# Patient Record
Sex: Female | Born: 1995
Health system: Southern US, Community
[De-identification: ages and names within clinical notes are randomized; demographics above are authoritative.]

## PROBLEM LIST (undated history)

## (undated) DIAGNOSIS — J069 Acute upper respiratory infection, unspecified: Secondary | ICD-10-CM

## (undated) DIAGNOSIS — T783XXA Angioneurotic edema, initial encounter: Secondary | ICD-10-CM

## (undated) DIAGNOSIS — L509 Urticaria, unspecified: Secondary | ICD-10-CM

## (undated) DIAGNOSIS — Z30017 Encounter for initial prescription of implantable subdermal contraceptive: Secondary | ICD-10-CM

## (undated) DIAGNOSIS — L309 Dermatitis, unspecified: Secondary | ICD-10-CM

## (undated) DIAGNOSIS — R636 Underweight: Secondary | ICD-10-CM

## (undated) DIAGNOSIS — Z3046 Encounter for surveillance of implantable subdermal contraceptive: Secondary | ICD-10-CM

## (undated) DIAGNOSIS — J45909 Unspecified asthma, uncomplicated: Secondary | ICD-10-CM

## (undated) DIAGNOSIS — N159 Renal tubulo-interstitial disease, unspecified: Secondary | ICD-10-CM

## (undated) DIAGNOSIS — O039 Complete or unspecified spontaneous abortion without complication: Secondary | ICD-10-CM

## (undated) DIAGNOSIS — Z87442 Personal history of urinary calculi: Secondary | ICD-10-CM

## (undated) HISTORY — PX: WISDOM TOOTH EXTRACTION: SHX21

## (undated) HISTORY — DX: Dermatitis, unspecified: L30.9

## (undated) HISTORY — DX: Urticaria, unspecified: L50.9

## (undated) HISTORY — DX: Unspecified asthma, uncomplicated: J45.909

## (undated) HISTORY — PX: TYMPANOSTOMY TUBE PLACEMENT: SHX32

## (undated) HISTORY — DX: Encounter for initial prescription of implantable subdermal contraceptive: Z30.017

## (undated) HISTORY — DX: Underweight: R63.6

## (undated) HISTORY — DX: Encounter for surveillance of implantable subdermal contraceptive: Z30.46

## (undated) HISTORY — PX: EAR TUBE REMOVAL: SHX1486

## (undated) HISTORY — DX: Acute upper respiratory infection, unspecified: J06.9

## (undated) HISTORY — DX: Complete or unspecified spontaneous abortion without complication: O03.9

## (undated) HISTORY — DX: Angioneurotic edema, initial encounter: T78.3XXA

---
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
## 2015-05-16 ENCOUNTER — Encounter: Payer: Self-pay | Admitting: Physician Assistant

## 2015-05-16 ENCOUNTER — Ambulatory Visit (INDEPENDENT_AMBULATORY_CARE_PROVIDER_SITE_OTHER): Payer: BLUE CROSS/BLUE SHIELD | Admitting: Physician Assistant

## 2015-05-16 VITALS — BP 96/68 | HR 88 | Temp 98.3°F | Resp 18 | Wt 86.0 lb

## 2015-05-16 DIAGNOSIS — J988 Other specified respiratory disorders: Secondary | ICD-10-CM | POA: Diagnosis not present

## 2015-05-16 DIAGNOSIS — B9689 Other specified bacterial agents as the cause of diseases classified elsewhere: Principal | ICD-10-CM

## 2015-05-16 MED ORDER — AZITHROMYCIN 250 MG PO TABS: ORAL_TABLET | ORAL | Status: DC

## 2015-05-16 NOTE — Progress Notes (Signed)
    Patient ID: Kelly Watts MRN: [id number], DOB: March 22, 1996, 20 y.o. Date of Encounter: 05/16/2015, 4:18 PM    Chief Complaint:  Chief Complaint  Patient presents with  . cough x 2 weeks    now chest hurts really bad     HPI: 20 y.o. year old white female presents with above.   Says she has had no congestion in her head/nose. Has been blowing nothing from her nose. No ST. No fever,chills. Has a lot of cough and chest congestion.      Home Meds:   Outpatient Prescriptions Prior to Visit  Medication Sig Dispense Refill  . etonogestrel (NEXPLANON) 68 MG IMPL implant Inject 1 each into the skin once.     No facility-administered medications prior to visit.    Allergies:  Allergies  Allergen Reactions  . Penicillins Hives  . Sulfa Antibiotics       Review of Systems: See HPI for pertinent ROS. All other ROS negative.    Physical Exam: Blood pressure 96/68, pulse 88, temperature 98.3 F (36.8 C), temperature source Oral, resp. rate 18, weight 86 lb (39.009 kg)., Body mass index is 15.24 kg/(m^2). General:  WF. Appears in no acute distress. HEENT: Normocephalic, atraumatic, eyes without discharge, sclera non-icteric, nares are without discharge. Bilateral auditory canals clear, TM's are without perforation, pearly grey and translucent with reflective cone of light bilaterally. Oral cavity moist, posterior pharynx without exudate, erythema, peritonsillar abscess.  Neck: Supple. No thyromegaly. No lymphadenopathy. Lungs: Clear bilaterally to auscultation without wheezes, rales, or rhonchi. Breathing is unlabored. Heart: Regular rhythm. No murmurs, rubs, or gallops. Msk:  Strength and tone normal for age. Extremities/Skin: Warm and dry. Neuro: Alert and oriented X 3. Moves all extremities spontaneously. Gait is normal. CNII-XII grossly in tact. Psych:  Responds to questions appropriately with a normal affect.     ASSESSMENT AND PLAN:  20 y.o. year old female with    1. Bacterial respiratory infection Allergy to PCN and Sulfa.  Start abx immediately, take as directed, complete all of it. Cont expectorant. Use cough suppressant at night so can get sleep. F/U if symptoms do not resolve within one week after completion of antibiotic.  - azithromycin (ZITHROMAX) 250 MG tablet; Day 1: Take 2 daily.  Days 2-5: Take 1 daily.  Dispense: 6 tablet; Refill: 0   Signed, 7090 Broad RoadMary Beth ElizabethDixon, [place]PA, [facility] 05/16/2015 4:18 PM

## 2015-06-27 ENCOUNTER — Encounter: Payer: Self-pay | Admitting: Family Medicine

## 2015-06-27 ENCOUNTER — Ambulatory Visit (INDEPENDENT_AMBULATORY_CARE_PROVIDER_SITE_OTHER): Payer: BLUE CROSS/BLUE SHIELD | Admitting: Family Medicine

## 2015-06-27 VITALS — BP 98/60 | HR 100 | Temp 102.2°F | Resp 20 | Ht 63.0 in | Wt 89.0 lb

## 2015-06-27 DIAGNOSIS — J111 Influenza due to unidentified influenza virus with other respiratory manifestations: Secondary | ICD-10-CM

## 2015-06-27 LAB — INFLUENZA A AND B AG, IMMUNOASSAY
INFLUENZA B ANTIGEN: NOT DETECTED
Influenza A Antigen: NOT DETECTED

## 2015-06-27 MED ORDER — OSELTAMIVIR PHOSPHATE 75 MG PO CAPS: 75.0000 mg | ORAL_CAPSULE | Freq: Two times a day (BID) | ORAL | Status: DC

## 2015-06-27 MED ORDER — ACETAMINOPHEN 500 MG PO TABS
1000.0000 mg | ORAL_TABLET | Freq: Once | ORAL | Status: AC
  Administered 2015-06-27: 1000 mg via ORAL

## 2015-06-27 NOTE — Progress Notes (Signed)
Patient ID: Kelly Watts, female   DOB: [birth year], 20 y.o.   MRN: [id number]   Subjective:    Patient ID: Kelly Watts, female    DOB: [birth year], 20 y.o.   MRN: [id number]  Patient presents for Illness patient here with fever last night 100.25F body aches and mild cough mild sore throat which all started last night. Positive sick contact with her cousin who has been diagnosed with the flu yesterday. She's not had any nausea vomiting or diarrhea. She took ibuprofen the last dose was 3 hours ago her temperature currently is 10 62F    Review Of Systems:  GEN- +fatigue,+ fever, weight loss,weakness, recent illness HEENT- denies eye drainage, change in vision, nasal discharge, CVS- denies chest pain, palpitations RESP- denies SOB,+ cough, wheeze ABD- denies N/V, change in stools, abd pain GU- denies dysuria, hematuria, dribbling, incontinence MSK- denies joint pain, +muscle aches, injury Neuro- + headache, dizziness, syncope, seizure activity       Objective:    BP 98/60 mmHg  Pulse 100  Temp(Src) 102.2 F (39 C) (Oral)  Resp 20  Ht  (1.6 m)  Wt 89 lb (40.37 kg)  BMI 15.77 kg/m2  SpO2 98% GEN- NAD, alert and oriented x3,ill appearing  HEENT- PERRL, EOMI, non injected sclera, pink conjunctiva, MMM, oropharynx clear, TM clear bilat, nares, clear, no maxillary sinus tenderness  Neck- Supple, no LAD  CVS- RRR, no murmur RESP-CTAB ABD-NABS,soft,NT,ND EXT- No edema Pulses- Radial,2+        Assessment & Plan:      Problem List Items Addressed This Visit    None    Visit Diagnoses    Influenza    -  Primary    Based on symptoms, history pt with Influenza, test in office neg, will treat with Tamiflu, given Tylenol in office, will alterante tylenol Motrin    Relevant Medications    oseltamivir (TAMIFLU) 75 MG capsule    acetaminophen (TYLENOL) tablet 1,000 mg (Completed)    Other Relevant Orders    Influenza A and B Ag, Immunoassay (Completed)       Note:  This dictation was prepared with Dragon dictation along with smaller phrase technology. Any transcriptional errors that result from this process are unintentional.

## 2015-06-27 NOTE — Patient Instructions (Signed)
Alternate tylenol and Ibuprofen  Take Tamiflu as prescribed F/U as needed    Influenza, Adult Influenza (flu) is an infection in the mouth, nose, and throat (respiratory tract) caused by a virus. The flu can make you feel very ill. Influenza spreads easily from person to person (contagious).  HOME CARE   Only take medicines as told by your doctor.  Use a cool mist humidifier to make breathing easier.  Get plenty of rest until your fever goes away. This usually takes 3 to 4 days.  Drink enough fluids to keep your pee (urine) clear or pale yellow.  Cover your mouth and nose when you cough or sneeze.  Wash your hands well to avoid spreading the flu.  Stay home from work or school until your fever has been gone for at least 1 full day.  Get a flu shot every year. GET HELP RIGHT AWAY IF:   You have trouble breathing or feel short of breath.  Your skin or nails turn blue.  You have severe neck pain or stiffness.  You have a severe headache, facial pain, or earache.  Your fever gets worse or keeps coming back.  You feel sick to your stomach (nauseous), throw up (vomit), or have watery poop (diarrhea).  You have chest pain.  You have a deep cough that gets worse, or you cough up more thick spit (mucus). MAKE SURE YOU:   Understand these instructions.  Will watch your condition.  Will get help right away if you are not doing well or get worse.   This information is not intended to replace advice given to you by your health care provider. Make sure you discuss any questions you have with your health care provider.   Document Released: 01/29/2008 Document Revised: 05/12/2014 Document Reviewed: 07/21/2011 Elsevier Interactive Patient Education [employer]

## 2015-06-28 ENCOUNTER — Ambulatory Visit: Payer: BLUE CROSS/BLUE SHIELD | Admitting: Physician Assistant

## 2015-09-04 ENCOUNTER — Emergency Department (HOSPITAL_COMMUNITY)
Admission: EM | Admit: 2015-09-04 | Discharge: 2015-09-04 | Disposition: A | Discharge status: Home / Self Care | Payer: Worker's Compensation | Attending: Emergency Medicine | Admitting: Emergency Medicine

## 2015-09-04 ENCOUNTER — Encounter (HOSPITAL_COMMUNITY): Payer: Self-pay | Admitting: Emergency Medicine

## 2015-09-04 ENCOUNTER — Emergency Department (HOSPITAL_COMMUNITY): Payer: Worker's Compensation

## 2015-09-04 DIAGNOSIS — W228XXA Striking against or struck by other objects, initial encounter: Secondary | ICD-10-CM | POA: Insufficient documentation

## 2015-09-04 DIAGNOSIS — Y990 Civilian activity done for income or pay: Secondary | ICD-10-CM | POA: Diagnosis not present

## 2015-09-04 DIAGNOSIS — Y929 Unspecified place or not applicable: Secondary | ICD-10-CM | POA: Insufficient documentation

## 2015-09-04 DIAGNOSIS — Y939 Activity, unspecified: Secondary | ICD-10-CM | POA: Diagnosis not present

## 2015-09-04 DIAGNOSIS — S5001XA Contusion of right elbow, initial encounter: Secondary | ICD-10-CM | POA: Diagnosis not present

## 2015-09-04 DIAGNOSIS — S59901A Unspecified injury of right elbow, initial encounter: Secondary | ICD-10-CM | POA: Diagnosis present

## 2015-09-04 MED ORDER — IBUPROFEN 400 MG PO TABS
400.0000 mg | ORAL_TABLET | Freq: Once | ORAL | Status: AC
  Administered 2015-09-04: 400 mg via ORAL
  Filled 2015-09-04: qty 1

## 2015-09-04 NOTE — ED Provider Notes (Signed)
CSN: [id number]     Arrival date & time 09/04/15  1905 History   First MD Initiated Contact with Patient 09/04/15 1938     Chief Complaint  Patient presents with  . Arm Pain     (Consider location/radiation/quality/duration/timing/severity/associated sxs/prior Treatment) HPI Kelly Watts is a 20 y.o. female who presents to the ED with right elbow pain and tingling and numbness in her finger tips after she hit her elbow on a machine at work.   Past Medical History  Diagnosis Date  . Patient underweight   . Medical history non-contributory   . Nexplanon insertion 11/02/2012    Inserted left arm 11/02/12   Past Surgical History  Procedure Laterality Date  . Wisdom tooth extraction    . Ear tube removal     Family History  Problem Relation Age of Onset  . Coronary artery disease Paternal Grandfather   . Cancer Paternal Grandmother     breast  . Diabetes Maternal Grandmother   . Thyroid disease Maternal Grandmother   . Melanoma Maternal Grandfather    Social History  Substance Use Topics  . Smoking status: Never Smoker   . Smokeless tobacco: Never Used  . Alcohol Use: No   OB History    Gravida Para Term Preterm AB TAB SAB Ectopic Multiple Living   1 1  1            Review of Systems Negative except as stated in HPI   Allergies  Penicillins and Sulfa antibiotics  Home Medications   Prior to Admission medications   Medication Sig Start Date End Date Taking? Authorizing Provider  etonogestrel (NEXPLANON) 68 MG IMPL implant Inject 1 each into the skin once.   Yes Historical Provider, MD   BP 121/76 mmHg  Pulse 74  Temp(Src) 98.2 F (36.8 C) (Oral)  Resp 18  Ht 5\' 3"  (1.6 m)  Wt 38.556 kg  BMI 15.06 kg/m2  SpO2 100% Physical Exam  Constitutional: She is oriented to person, place, and time. She appears well-developed and well-nourished.  HENT:  Head: Normocephalic.  Eyes: EOM are normal.  Neck: Neck supple.  Cardiovascular: Normal rate.   Pulmonary/Chest:  Effort normal.  Musculoskeletal:       Right elbow: She exhibits no deformity and no laceration. Decreased range of motion: due to pain. Swelling: minimal. Tenderness found. Radial head tenderness noted.  Full passive range of motion with minimal pain. Tender with palpation over the radial head. Radial pulse 2+, adequate circulation, equal grips. No radial nerve palsy.   Neurological: She is alert and oriented to person, place, and time. No cranial nerve deficit.  Skin: Skin is warm and dry.  Psychiatric: She has a normal mood and affect. Her behavior is normal.  Nursing note and vitals reviewed.   ED Course  Procedures (including critical care time) Labs Review  MDM  20 y.o. female with right elbow pain s/p injury at work tonight stable for d/c without focal neuro deficits. Ace wrap applied, NSAIDS, ice, rest and return as needed for worsening symptoms.   Final diagnoses:  Contusion, elbow, right, initial encounter       [facility] Ac Colan, NP 09/04/15 2134  Mancel BaleElliott Wentz, MD 09/05/15 1058

## 2015-09-04 NOTE — ED Notes (Signed)
Pt states she hit her rt elbow on machinery at work and now her fingertips are numb. This is workman's comp issue and she will need a drug screen before being discharged.

## 2015-09-04 NOTE — Discharge Instructions (Signed)
Elbow Contusion °An elbow contusion is a deep bruise of the elbow. Contusions are the result of an injury that caused bleeding under the skin. The contusion may turn blue, purple, or yellow. Minor injuries will give you a painless contusion, but more severe contusions may stay painful and swollen for a few weeks.  °CAUSES  °An elbow contusion comes from a direct force to that area, such as falling on the elbow. °SYMPTOMS  °· Swelling and redness of the elbow. °· Bruising of the elbow area. °· Tenderness or soreness of the elbow. °DIAGNOSIS  °You will have a physical exam and will be asked about your history. You may need an X-ray of your elbow to look for a broken bone (fracture).  °TREATMENT  °A sling or splint may be needed to support your injury. Resting, elevating, and applying cold compresses to the elbow area are often the best treatments for an elbow contusion. Over-the-counter medicines may also be recommended for pain control. °HOME CARE INSTRUCTIONS  °· Put ice on the injured area. °¨ Put ice in a plastic bag. °¨ Place a towel between your skin and the bag. °¨ Leave the ice on for 15-20 minutes, 03-04 times a day. °· Only take over-the-counter or prescription medicines for pain, discomfort, or fever as directed by your caregiver. °· Rest your injured elbow until the pain and swelling are better. °· Elevate your elbow to reduce swelling. °· Apply a compression wrap as directed by your caregiver. This can help reduce swelling and motion. You may remove the wrap for sleeping, showers, and baths. If your fingers become numb, cold, or blue, take the wrap off and reapply it more loosely. °· Use your elbow only as directed by your caregiver. You may be asked to do range of motion exercises. Do them as directed. °· See your caregiver as directed. It is very important to keep all follow-up appointments in order to avoid any long-term problems with your elbow, including chronic pain or inability to move your elbow  normally. °SEEK IMMEDIATE MEDICAL CARE IF:  °· You have increased redness, swelling, or pain in your elbow. °· Your swelling or pain is not relieved with medicines. °· You have swelling of the hand and fingers. °· You are unable to move your fingers or wrist. °· You begin to lose feeling in your hand or fingers. °· Your fingers or hand become cold or blue. °MAKE SURE YOU:  °· Understand these instructions. °· Will watch your condition. °· Will get help right away if you are not doing well or get worse. °  °This information is not intended to replace advice given to you by your health care provider. Make sure you discuss any questions you have with your health care provider. °  °Document Released: 03/30/2006 Document Revised: 07/14/2011 Document Reviewed: 12/04/2014 °Elsevier Interactive Patient Education ©2016 Else[employer] Inc[employer] ° °

## 2015-11-16 ENCOUNTER — Ambulatory Visit (INDEPENDENT_AMBULATORY_CARE_PROVIDER_SITE_OTHER): Payer: BLUE CROSS/BLUE SHIELD | Admitting: Adult Health

## 2015-11-16 ENCOUNTER — Encounter: Payer: Self-pay | Admitting: Adult Health

## 2015-11-16 VITALS — BP 108/60 | HR 68 | Ht 63.0 in | Wt 91.0 lb

## 2015-11-16 DIAGNOSIS — Z3202 Encounter for pregnancy test, result negative: Secondary | ICD-10-CM

## 2015-11-16 DIAGNOSIS — Z3046 Encounter for surveillance of implantable subdermal contraceptive: Secondary | ICD-10-CM

## 2015-11-16 DIAGNOSIS — Z30017 Encounter for initial prescription of implantable subdermal contraceptive: Secondary | ICD-10-CM

## 2015-11-16 DIAGNOSIS — Z975 Presence of (intrauterine) contraceptive device: Secondary | ICD-10-CM | POA: Insufficient documentation

## 2015-11-16 HISTORY — DX: Encounter for surveillance of implantable subdermal contraceptive: Z30.46

## 2015-11-16 LAB — POCT URINE PREGNANCY: Preg Test, Ur: NEGATIVE

## 2015-11-16 NOTE — Progress Notes (Signed)
Subjective:     Patient ID: Kelly Watts, female   DOB: 1995/10/30, 20 y.o.   MRN: [id number]  HPI Kelly Watts is a 20 year old white female in for nexplanon removal and reinsertion.   Review of Systems For nexplanon removal and reinsertion Reviewed past medical,surgical, social and family history. Reviewed medications and allergies.     Objective:   Physical Exam BP 108/60 mmHg  Pulse 68  Ht 5\' 3"  (1.6 m)  Wt 91 lb (41.277 kg)  BMI 16.12 kg/m2 UPT negative,consent signed, time out called, left arm cleansed with betadine, and injected with 1.5 cc 2% lidocaine and waited til numb.Under sterile technique a #11 blade was used to make small vertical incision, and a curved forceps was used to easily remove rod.  Nexplanon easily inserted and steri strips applied.Rod easily palpated by provider and pt. Pressure dressing applied.     exp  Assessment:     Nexplanon remvoal Nexplanon insertion lot #[id number] exp 5/19     Plan:      Use condoms x 2 weeks, keep clean and dry x 24 hours, no heavy lifting, keep steri strips on x 72 hours, Keep pressure dressing on x 24 hours. Follow up prn problems.   Note given for work, return Monday, excuse today

## 2015-11-16 NOTE — Patient Instructions (Signed)
Use condoms x 2 weeks, keep clean and dry x 24 hours, no heavy lifting, keep steri strips on x 72 hours, Keep pressure dressing on x 24 hours. Follow up prn problems.  

## 2015-11-21 ENCOUNTER — Telehealth: Payer: Self-pay | Admitting: Adult Health

## 2015-11-21 NOTE — Telephone Encounter (Signed)
Spoke with pt. Pt lifts 6-8lbs at work and thinks Nexplanon is shifting in her arm. I spoke with Selena BattenKim, CNM and she recommends lifting with the other arm or she can come in so provider can take a look at arm. Pt voiced understanding and call was transferred to front desk for appt. JSY

## 2015-11-21 NOTE — Telephone Encounter (Signed)
Spoke with pt. Pt got Nexplanon placed Friday. Pt states it has shifted in her arm and she has had dizziness and headaches since it was placed. Please advise. Thanks!! JSY

## 2015-11-21 NOTE — Telephone Encounter (Signed)
Spoke with pt advising to give it a little more time to see if side effects will improve. Take Tylenol for headache, eat well and drink plenty of water. Pt was advised not to mess or play with Nexplanon. Pt states she hasn't been messing with Nexplanon. Pt voiced understanding. JSY

## 2015-11-21 NOTE — Telephone Encounter (Signed)
Left message x 1. JSY 

## 2015-11-28 ENCOUNTER — Ambulatory Visit (INDEPENDENT_AMBULATORY_CARE_PROVIDER_SITE_OTHER): Payer: PRIVATE HEALTH INSURANCE | Admitting: Adult Health

## 2015-11-28 ENCOUNTER — Encounter: Payer: Self-pay | Admitting: Adult Health

## 2015-11-28 VITALS — BP 100/68 | HR 76 | Ht 63.0 in | Wt 93.0 lb

## 2015-11-28 DIAGNOSIS — Z975 Presence of (intrauterine) contraceptive device: Secondary | ICD-10-CM

## 2015-11-28 DIAGNOSIS — Z3049 Encounter for surveillance of other contraceptives: Secondary | ICD-10-CM

## 2015-11-28 NOTE — Patient Instructions (Signed)
Leave rod alone  Follow up prn

## 2015-11-28 NOTE — Progress Notes (Signed)
Subjective:     Patient ID: Kelly Watts, female   DOB: 09/06/1995, 20 y.o.   MRN: [id number]  HPI [name] is a 20 year old white female in wanting nexplanon checked, she thinks it has moved and will come out.It was inserted 11/16/15 after another was removed.And she says it is still a little tender.  Review of Systems Nexplanon site tender and ? Moved  Reviewed past medical,surgical, social and family history. Reviewed medications and allergies.     Objective:   Physical Exam BP 100/68 (BP Location: Left Arm, Patient Position: Sitting, Cuff Size: Normal)   Pulse 76   Ht 5\' 3"  (1.6 m)   Wt 93 lb (42.2 kg)   LMP 11/25/2015 (Exact Date)   BMI 16.47 kg/m   Nexplanon easily palpated in left arm and end is near insertion site which has scab on it, but is not coming out, no redness or swelling, it is a little tender still with palpation. Will give note to return to work 11/29/15 since in office today. Instructed to leave rod alone and it should be fine. Face time 10 minutes, with over 50% counseling and talking.    Assessment:     Nexplanon in place     Plan:     Leave rod alone  Follow up prn Note given for work, return 11/29/15

## 2015-12-14 ENCOUNTER — Encounter: Payer: Self-pay | Admitting: Physician Assistant

## 2015-12-14 ENCOUNTER — Encounter: Payer: Self-pay | Admitting: Family Medicine

## 2015-12-14 ENCOUNTER — Ambulatory Visit (INDEPENDENT_AMBULATORY_CARE_PROVIDER_SITE_OTHER): Payer: PRIVATE HEALTH INSURANCE | Admitting: Family Medicine

## 2015-12-14 VITALS — BP 90/62 | HR 82 | Resp 16 | Ht 63.0 in | Wt 90.0 lb

## 2015-12-14 DIAGNOSIS — J029 Acute pharyngitis, unspecified: Secondary | ICD-10-CM

## 2015-12-14 DIAGNOSIS — J039 Acute tonsillitis, unspecified: Secondary | ICD-10-CM | POA: Diagnosis not present

## 2015-12-14 LAB — STREP GROUP A AG, W/REFLEX TO CULT: STREGTOCOCCUS GROUP A AG SCREEN: NOT DETECTED

## 2015-12-14 MED ORDER — FIRST-DUKES MOUTHWASH MT SUSP: OROMUCOSAL | 0 refills | Status: DC

## 2015-12-14 MED ORDER — CEFDINIR 300 MG PO CAPS: 300.0000 mg | ORAL_CAPSULE | Freq: Two times a day (BID) | ORAL | 0 refills | Status: DC

## 2015-12-14 NOTE — Progress Notes (Signed)
   Subjective:    Patient ID: Kelly Watts, female    DOB: 02-05-1996, 20 y.o.   MRN: [id number]  Patient presents for Sore Throat (x 2 days) and Vomiting (once yesterday )  Sore throat for past 2 days, no fever, mild chills, had vomiting yesteday after pain with eating. No abdominal pain currently. Tonsils present No known sick contacts No cough or congestion Ibuprofen taken for pain     Review Of Systems: per above   GEN- denies fatigue, fever, weight loss,weakness, recent illness HEENT- denies eye drainage, change in vision, nasal discharge, CVS- denies chest pain, palpitations RESP- denies SOB, cough, wheeze ABD- +N/V, denies  change in stools, abd pain Neuro- denies headache, dizziness, syncope, seizure activity       Objective:    BP 90/62   Pulse 82   Resp 16   Ht 5\' 3"  (1.6 m)   Wt 90 lb (40.8 kg)   LMP 11/25/2015 (Exact Date)   SpO2 98%   BMI 15.94 kg/m  GEN- NAD, alert and oriented x3 HEENT- PERRL, EOMI, non injected sclera, pink conjunctiva, MMM, oropharynx mild injection, Tonsils enlarged with exduate R>l  TM clear bilat no effusion,  Nares clear, no sinus tenderness  Neck- Supple, + shotty ant  LAD CVS- RRR, no murmur RESP-CTAB ABD-NABS, soft,NT,ND  Pulses- Radial 2+          Assessment & Plan:      Problem List Items Addressed This Visit    None    Visit Diagnoses    Tonsillopharyngitis    -  Primary   defintite tonsillitis, strep in office neg, start omnicef, magic mouthwash, note for work, throat culture sent , inbufpren for pain   Relevant Orders   STREP GROUP A AG, W/REFLEX TO CULT (Completed)      Note: This dictation was prepared with Dragon dictation along with smaller phrase technology. Any transcriptional errors that result from this process are unintentional.

## 2015-12-14 NOTE — Patient Instructions (Signed)
Give work note for yesterday and today. Can return Monday  F/U as needed

## 2015-12-16 ENCOUNTER — Encounter: Payer: Self-pay | Admitting: Family Medicine

## 2016-02-14 ENCOUNTER — Ambulatory Visit (INDEPENDENT_AMBULATORY_CARE_PROVIDER_SITE_OTHER): Payer: PRIVATE HEALTH INSURANCE | Admitting: Physician Assistant

## 2016-02-14 ENCOUNTER — Encounter: Payer: Self-pay | Admitting: Physician Assistant

## 2016-02-14 VITALS — BP 98/64 | HR 90 | Temp 97.8°F | Resp 16 | Wt 94.0 lb

## 2016-02-14 DIAGNOSIS — J988 Other specified respiratory disorders: Secondary | ICD-10-CM | POA: Diagnosis not present

## 2016-02-14 DIAGNOSIS — B9789 Other viral agents as the cause of diseases classified elsewhere: Secondary | ICD-10-CM

## 2016-02-14 DIAGNOSIS — J020 Streptococcal pharyngitis: Secondary | ICD-10-CM

## 2016-02-14 LAB — STREP GROUP A AG, W/REFLEX TO CULT: STREGTOCOCCUS GROUP A AG SCREEN: NOT DETECTED

## 2016-02-14 NOTE — Progress Notes (Signed)
Patient ID: Kelly Watts MRN: [id number], DOB: April 07, 1996, 20 y.o. Date of Encounter: 02/14/2016, 3:21 PM    Chief Complaint:  Chief Complaint  Patient presents with  . Sore Throat    stomach x 1 day     HPI: 20 y.o. year old female presents With above. Says symptoms just started last night around 8 PM. Started to feel a little nauseous at that time. At about 11 PM her throat started to feel kind of swollen and itchy. Today she has continued with some nausea but no vomiting no diarrhea.No abdominal pain. Throat still feels kind of irritated. She has had some cough today. Has not had much nasal congestion or much mucus from the nose. Has had no fevers or chills. Works second shift 3 PM to 11 PM Monday through Friday. Worked her entire shift last night.     Home Meds:   Outpatient Medications Prior to Visit  Medication Sig Dispense Refill  . etonogestrel (NEXPLANON) 68 MG IMPL implant Inject 1 each into the skin once.    . cefdinir (OMNICEF) 300 MG capsule Take 1 capsule (300 mg total) by mouth 2 (two) times daily. (Patient not taking: Reported on 02/14/2016) 14 capsule 0  . Diphenhyd-Hydrocort-Nystatin (FIRST-DUKES MOUTHWASH) SUSP 1 teaspoon gargle and spit four times a day as needed for 1 week (Patient not taking: Reported on 02/14/2016) 1 Bottle 0   No facility-administered medications prior to visit.     Allergies:  Allergies  Allergen Reactions  . Penicillins Hives  . Sulfa Antibiotics       Review of Systems: See HPI for pertinent ROS. All other ROS negative.    Physical Exam: Blood pressure 98/64, pulse 90, temperature 97.8 F (36.6 C), temperature source Oral, resp. rate 16, weight 94 lb (42.6 kg), SpO2 98 %., Body mass index is 16.65 kg/m. General:  WNWD WF. Appears in no acute distress. HEENT: Normocephalic, atraumatic, eyes without discharge, sclera non-icteric, nares are without discharge. Bilateral auditory canals clear, TM's are without perforation,  pearly grey and translucent with reflective cone of light bilaterally. Oral cavity moist.  Posterior pharynx with mild erythema. No exudate, no peritonsillar abscess. Neck: Supple. No thyromegaly. She reports some tenderness with palpation of bilateral tonsillar nodes but these are not enlarged on exam. Lungs: Clear bilaterally to auscultation without wheezes, rales, or rhonchi. Breathing is unlabored. Heart: Regular rhythm. No murmurs, rubs, or gallops. Abdomen: Soft, non-tender, non-distended with normoactive bowel sounds. No hepatomegaly. No rebound/guarding. No obvious abdominal masses. Msk:  Strength and tone normal for age. Extremities/Skin: Warm and dry. Neuro: Alert and oriented X 3. Moves all extremities spontaneously. Gait is normal. CNII-XII grossly in tact. Psych:  Responds to questions appropriately with a normal affect.   Results for orders placed or performed in visit on 12/14/15  STREP GROUP A AG, W/REFLEX TO CULT  Result Value Ref Range   SOURCE THROAT SWAB    STREGTOCOCCUS GROUP A AG SCREEN Not Detected      ASSESSMENT AND PLAN:  20 y.o. year old female with  1. Viral respiratory infection Offered prescription medication for nausea but she says that that is not that significant and defers Rx. Discussed that symptoms just started in the past 24 hours but that at this point no red flags on physical exam and strep test negative. At this point this is consistent with viral illness. Recommend using over-the-counter medications for symptom relief. He can use lozenges spray Tylenol Motrin to relieve the sore throat.  If she develops fever or if symptoms worsen significantly then call. As well if symptoms persist 7-10 days without resolving and call us for follow-up as well. Note given for out of work for [employer] and tomorrow night-- plan to return Monday.  2. Streptococcal sore throat - STREP GROUP A AG, W/REFLEX TO CULT   Signed, [address][name], [place], [facility] 02/14/2016 3:21  PM

## 2016-02-16 LAB — CULTURE, GROUP A STREP: ORGANISM ID, BACTERIA: NORMAL

## 2016-02-20 ENCOUNTER — Telehealth: Payer: Self-pay | Admitting: Physician Assistant

## 2016-02-20 ENCOUNTER — Ambulatory Visit (INDEPENDENT_AMBULATORY_CARE_PROVIDER_SITE_OTHER): Payer: BLUE CROSS/BLUE SHIELD | Admitting: Physician Assistant

## 2016-02-20 VITALS — BP 98/62 | HR 98 | Temp 97.8°F | Resp 18 | Wt 92.0 lb

## 2016-02-20 DIAGNOSIS — J988 Other specified respiratory disorders: Secondary | ICD-10-CM

## 2016-02-20 DIAGNOSIS — B9689 Other specified bacterial agents as the cause of diseases classified elsewhere: Principal | ICD-10-CM

## 2016-02-20 MED ORDER — AZITHROMYCIN 250 MG PO TABS: ORAL_TABLET | ORAL | 0 refills | Status: DC

## 2016-02-20 NOTE — Telephone Encounter (Signed)
Patient was feeling better, but now has relapsed and is coughing with chest pain from cough and taking tylenol sinus severe but is still feeling horrible  [phone]

## 2016-02-20 NOTE — Progress Notes (Signed)
Patient ID: Kelly Watts MRN: [id number], DOB: [birth year], 20 y.o. Date of Encounter: 02/20/2016, 3:59 PM    Chief Complaint:  Chief Complaint  Patient presents with  . Chest Pain    cough     HPI: 20 y.o. year old female presetns with above.   She had OV with me 02/14/16. At that time her symptoms had just started the previous evening at around 8 PM. She started to feel a little nauseous. Around 11 PM her throat started to feel a little swollen and itchy. At the time of her visit she said she was having some nausea but had no vomiting or diarrhea. No abdominal pain. Throat felt a little irritated. She developed some cough that day. Reported that she had had not had much nasal congestion or much mucus from the nose. Had had no fevers or chills. At that visit there were no significant abnormalities on physical exam. Strep test was negative. Felt that she had a viral respiratory infection that should run its course and resolve.  Today she reports that she is having significant cough. She is coughing repeatedly through her visit here. Says that she's had just very little nasal congestion and mucus from the nose mostly all cough. No significant sore throat. No fevers or chills. Last visit I did write her out of work for a short period. She states that she stayed out of work those days but then returned to work since then.     Home Meds:   Outpatient Medications Prior to Visit  Medication Sig Dispense Refill  . cefdinir (OMNICEF) 300 MG capsule Take 1 capsule (300 mg total) by mouth 2 (two) times daily. 14 capsule 0  . Diphenhyd-Hydrocort-Nystatin (FIRST-DUKES MOUTHWASH) SUSP 1 teaspoon gargle and spit four times a day as needed for 1 week 1 Bottle 0  . etonogestrel (NEXPLANON) 68 MG IMPL implant Inject 1 each into the skin once.     No facility-administered medications prior to visit.     Allergies:  Allergies  Allergen Reactions  . Penicillins Hives  . Sulfa Antibiotics        Review of Systems: See HPI for pertinent ROS. All other ROS negative.    Physical Exam: Blood pressure 98/62, pulse 98, temperature 97.8 F (36.6 C), temperature source Oral, resp. rate 18, weight 92 lb (41.7 kg), SpO2 98 %., Body mass index is 16.3 kg/m. General:  WF. Appears in no acute distress. HEENT: Normocephalic, atraumatic, eyes without discharge, sclera non-icteric, nares are without discharge. Bilateral auditory canals clear, TM's are without perforation, pearly grey and translucent with reflective cone of light bilaterally. Oral cavity moist, posterior pharynx without exudate, erythema, peritonsillar abscess, or post nasal drip.  Neck: Supple. No thyromegaly. No lymphadenopathy. Lungs: Clear bilaterally to auscultation without wheezes, rales, or rhonchi. Breathing is unlabored. Heart: Regular rhythm. No murmurs, rubs, or gallops. Msk:  Strength and tone normal for age. Extremities/Skin: Warm and dry. Neuro: Alert and oriented X 3. Moves all extremities spontaneously. Gait is normal. CNII-XII grossly in tact. Psych:  Responds to questions appropriately with a normal affect.     ASSESSMENT AND PLAN:  20 y.o. year old female with  1. Bacterial respiratory infection She has penicillin allergy. Therefore, willl use azithromycin. She is to start this immediately and take as directed. Use over-the-counter cough medications as needed for symptom relief. Will give her a note to be out of work for [employer] and tomorrow night. If symptoms do not resolve within 1  week after completion of antibiotic then follow-up. - azithromycin (ZITHROMAX) 250 MG tablet; Day 1: Take 2 daily. Days 2-5: Take 1 daily.  Dispense: 6 tablet; Refill: 0   Signed, [address] [place], [place], [facility] 02/20/2016 3:59 PM

## 2016-02-20 NOTE — Telephone Encounter (Signed)
Spoke with pt she will come in today for OV

## 2016-02-20 NOTE — Telephone Encounter (Signed)
Called pt no answer LVM to call bck

## 2016-03-13 ENCOUNTER — Ambulatory Visit (INDEPENDENT_AMBULATORY_CARE_PROVIDER_SITE_OTHER): Payer: BLUE CROSS/BLUE SHIELD | Admitting: Physician Assistant

## 2016-03-13 ENCOUNTER — Encounter: Payer: Self-pay | Admitting: Family Medicine

## 2016-03-13 ENCOUNTER — Ambulatory Visit
Admission: RE | Admit: 2016-03-13 | Discharge: 2016-03-13 | Disposition: A | Discharge status: Home / Self Care | Payer: BLUE CROSS/BLUE SHIELD | Source: Ambulatory Visit | Attending: Physician Assistant | Admitting: Physician Assistant

## 2016-03-13 ENCOUNTER — Encounter: Payer: Self-pay | Admitting: Physician Assistant

## 2016-03-13 VITALS — BP 99/60 | HR 73 | Temp 97.7°F | Resp 18 | Wt 92.0 lb

## 2016-03-13 DIAGNOSIS — M79671 Pain in right foot: Secondary | ICD-10-CM | POA: Diagnosis not present

## 2016-03-13 NOTE — Progress Notes (Signed)
Patient ID: Kelly JunglingCourtney A James MRN: [id number], DOB: 05/16/1995, 20 y.o. Date of Encounter: 03/13/2016, 9:39 AM    Chief Complaint:  Chief Complaint  Patient presents with  . Foot Pain    right foot pain     HPI: 20 y.o. year old female presents with above.   Says that last Thursday, which was exactly 1 week ago, [facility] [employer] [facility] [employer] [facility] self [facility] out and hit her right foot on the metal plate at the self checkout. Says that immediately after that she had some pain but it wasn't severe excruciating pain. She has continued to go to work on a routine schedule and has not missed any work. Her work requires standing on a concrete floor for 8 hours.  She says that she fractured this foot in the same area where she is having the pain right now--fractured this foot when she was in seventh grade.  Says that she is still having pain in that same area of the right foot.  Points to the lateral aspect of the right foot about midway between the heel and the toes--as area of pain.  Says that last night "it popped "--- that concerned her --and the fact that it is still painful --decided she needed to come in and get this checked.   Home Meds:   Outpatient Medications Prior to Visit  Medication Sig Dispense Refill  . etonogestrel (NEXPLANON) 68 MG IMPL implant Inject 1 each into the skin once.    Marland Kitchen. azithromycin (ZITHROMAX) 250 MG tablet Day 1: Take 2 daily. Days 2-5: Take 1 daily. (Patient not taking: Reported on 03/13/2016) 6 tablet 0  . cefdinir (OMNICEF) 300 MG capsule Take 1 capsule (300 mg total) by mouth 2 (two) times daily. (Patient not taking: Reported on 03/13/2016) 14 capsule 0  . Diphenhyd-Hydrocort-Nystatin (FIRST-DUKES MOUTHWASH) SUSP 1 teaspoon gargle and spit four times a day as needed for 1 week (Patient not taking: Reported on 03/13/2016) 1 Bottle 0   No facility-administered medications prior to visit.     Allergies:  Allergies  Allergen Reactions  . Penicillins Hives  .  Sulfa Antibiotics       Review of Systems: See HPI for pertinent ROS. All other ROS negative.    Physical Exam: Blood pressure 99/60, pulse 73, temperature 97.7 F (36.5 C), temperature source Oral, resp. rate 18, weight 92 lb (41.7 kg), SpO2 98 %., Body mass index is 16.3 kg/m. General:  WNWD WF. Appears in no acute distress. Neck: Supple. No thyromegaly. No lymphadenopathy. Lungs: Clear bilaterally to auscultation without wheezes, rales, or rhonchi. Breathing is unlabored. Heart: Regular rhythm. No murmurs, rubs, or gallops. Msk:  Right Foot: Inspection: There is no ecchymosis, no erythema.  Lateral edge of foot---about half-way between heel and toes----there is a lateral protrusion. At that site---it is severely tender to even very light touch. Extremities/Skin: Warm and dry.  Neuro: Alert and oriented X 3. Moves all extremities spontaneously. Gait is normal. CNII-XII grossly in tact. Psych:  Responds to questions appropriately with a normal affect.     ASSESSMENT AND PLAN:  20 y.o. year old female with  1. Right foot pain Will obtain x-ray. She is going directly to get x-ray now. Follow-up with her once I get the x-ray report. Is to avoid weightbearing. She does not recall the name of the orthopedic she saw in the past and has not seen any orthopedic recently. She has not missed work so far. Determine out of work  note once I get x-ray report. - DG Foot Complete Right; Future   Signed, Shon HaleMary Beth [place], [place], [facility] 03/13/2016 9:39 AM

## 2016-03-17 ENCOUNTER — Encounter: Payer: Self-pay | Admitting: Physician Assistant

## 2016-03-17 DIAGNOSIS — M25571 Pain in right ankle and joints of right foot: Secondary | ICD-10-CM

## 2016-03-17 NOTE — Telephone Encounter (Signed)
Kelly Watts,  Will you call pt and tell her that I recommend she follow-up with orthopedics. If they feel that further imaging is indicated they can do it there. I have spoken with her mother who states the patient has never seen orthopedics but her brother has seen [place]. [place] and wants her to see [place] Place order for referral to Dr. Romeo AppleHarrison. In regards to her message--- I recommend she avoid weight bearing as much as possible. ( I dont know what she needs to do regarding work---does it hurt bad enough that she needs to be out of work further? --- If the brace makes it feel better then continue wearing the brace.

## 2016-03-18 NOTE — Telephone Encounter (Signed)
Referral to Dr Romeo AppleHarrison initiated

## 2016-03-20 ENCOUNTER — Other Ambulatory Visit: Payer: Self-pay | Admitting: Family Medicine

## 2016-03-20 DIAGNOSIS — M79671 Pain in right foot: Secondary | ICD-10-CM

## 2016-03-24 ENCOUNTER — Encounter: Payer: Self-pay | Admitting: Orthopedic Surgery

## 2016-03-24 ENCOUNTER — Ambulatory Visit (INDEPENDENT_AMBULATORY_CARE_PROVIDER_SITE_OTHER): Payer: BLUE CROSS/BLUE SHIELD | Admitting: Orthopedic Surgery

## 2016-03-24 VITALS — Wt 92.0 lb

## 2016-03-24 DIAGNOSIS — M25571 Pain in right ankle and joints of right foot: Secondary | ICD-10-CM | POA: Diagnosis not present

## 2016-03-24 NOTE — Patient Instructions (Signed)
No work for 4 weeks  Brace for 4 weeks  4 weeks of therapy  Return in 4 weeks

## 2016-03-24 NOTE — Progress Notes (Signed)
Patient ID: Kelly Watts, female   DOB: [birth year], 20 y.o.   MRN: [id number]  Chief Complaint  Patient presents with  . Foot Pain    right foot pain, hit on metal plate, DOI 40/01/8110/2/17    HPI Kelly JunglingCourtney A Watts is a 20 y.o. female.  She comes in for evaluation of her right ankle and foot  She was initially injured when she hit her foot on a metal plate on the second she then landed funny on the ankle at work and felt a loud pop and now complains of global ankle pain. She's had an x-ray of her foot and MRI of the foot and ankle and they were negative  She complains of severe pain can't weight-bear can get the foot down on the ground. There is no radiation there is no numbness no tingling  Review of Systems Review of Systems  Constitutional: Negative for chills.  Respiratory: Negative.   Cardiovascular: Negative.   Musculoskeletal: Positive for gait problem.     Past Medical History:  Diagnosis Date  . Encounter for Nexplanon removal 11/16/2015  . Medical history non-contributory   . Nexplanon insertion 11/02/2012   Inserted left arm 11/02/12  . Patient underweight     Past Surgical History:  Procedure Laterality Date  . EAR TUBE REMOVAL    . WISDOM TOOTH EXTRACTION      Social History Social History  Substance Use Topics  . Smoking status: Former Smoker    Packs/day: 0.25    Years: 3.00    Types: Cigarettes    Quit date: 01/30/2016  . Smokeless tobacco: Never Used  . Alcohol use No     Comment: occ    Allergies  Allergen Reactions  . Penicillins Hives  . Sulfa Antibiotics     Current Meds  Medication Sig  . azithromycin (ZITHROMAX) 250 MG tablet Day 1: Take 2 daily. Days 2-5: Take 1 daily.  . cefdinir (OMNICEF) 300 MG capsule Take 1 capsule (300 mg total) by mouth 2 (two) times daily.  . Diphenhyd-Hydrocort-Nystatin (FIRST-DUKES MOUTHWASH) SUSP 1 teaspoon gargle and spit four times a day as needed for 1 week  . etonogestrel (NEXPLANON) 68 MG IMPL implant Inject 1  each into the skin once.      Physical Exam Physical Exam Wt 92 lb (41.7 kg)   BMI 16.30 kg/m   Gen. appearance. The patient is well-developed and well-nourished, grooming and hygiene are normal. There are no gross congenital abnormalities  The patient is alert and oriented to person place and time  Mood and affect are normal  Ambulation She ambulates tiptoe on the right side  Examination reveals the following: On inspection we find tenderness posteriorly laterally medially anteriorly at the ankle joint  With normal passive range of motion of the ankle joint  Stability tests were normal    Strength tests revealed grade 5 motor strength  Skin we find no rash ulceration or erythema  Sensation remains intact  Impression vascular system shows no peripheral edema  Data Reviewed MRI pictures and report negative foot x-ray negative  Assessment    Ankle pain 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

## 2016-03-24 NOTE — Addendum Note (Signed)
Addended by: Adella HareBOOTHE, JAIME B on: 03/24/2016 03:37 PM   Modules accepted: Orders

## 2016-03-31 ENCOUNTER — Ambulatory Visit (HOSPITAL_COMMUNITY): Payer: BLUE CROSS/BLUE SHIELD | Attending: Orthopedic Surgery | Admitting: Physical Therapy

## 2016-03-31 DIAGNOSIS — R262 Difficulty in walking, not elsewhere classified: Secondary | ICD-10-CM | POA: Insufficient documentation

## 2016-03-31 DIAGNOSIS — M6281 Muscle weakness (generalized): Secondary | ICD-10-CM | POA: Insufficient documentation

## 2016-03-31 DIAGNOSIS — M25671 Stiffness of right ankle, not elsewhere classified: Secondary | ICD-10-CM | POA: Diagnosis present

## 2016-03-31 DIAGNOSIS — M25571 Pain in right ankle and joints of right foot: Secondary | ICD-10-CM | POA: Diagnosis present

## 2016-03-31 NOTE — Patient Instructions (Signed)
   ANKLE CIRCLES  Move your ankle in a circular pattern one direction for several repetitions and then reverse the direction.   Repeat 10-15 times each direction, 2-3 times per day.     ANKLE ABC's   While in a seated position, write out the alphabet in the air with your big toe.  Your ankle should be moving as you perform this.  Repeat 1-2 cycles through the alphabet, 2-3 times per day.    SEATED CALF STRETCH - GASTROC  While sitting, use a towel or other strap looped around your foot. Gently pull your ankle back until a stretch is felt along the back of your lower leg.  Hold for 30 seconds, then relax. Repeat 2-3 times, twice a day.

## 2016-03-31 NOTE — Therapy (Signed)
[facility] [address] [place], [place], [address] Phone: [phone]   Fax:  [phone]  Physical Therapy Evaluation  Patient Details  Name: Kelly Watts MRN: [id number] Date of Birth: [birth year] Referring Provider: Fuller CanadaStanley Harrison   Encounter Date: 03/31/2016      PT End of Session - 03/31/16 1201    Visit Number 1   Number of Visits 12   Date for PT Re-Evaluation 04/21/16   Authorization Type BCBS Other    Authorization Time Period 03/31/16 to 05/12/16   PT Start Time 1118   PT Stop Time 1146   PT Time Calculation (min) 28 min   Activity Tolerance Patient tolerated treatment well   Behavior During Therapy [facility] for tasks assessed/performed      Past Medical History:  Diagnosis Date  . Encounter for Nexplanon removal 11/16/2015  . Medical history non-contributory   . Nexplanon insertion 11/02/2012   Inserted left arm 11/02/12  . Patient underweight     Past Surgical History:  Procedure Laterality Date  . EAR TUBE REMOVAL    . WISDOM TOOTH EXTRACTION      There were no vitals filed for this visit.       Subjective Assessment - 03/31/16 1120    Subjective Patient reports that she hit her foot [facility] the [facility]; a week later, she hopped up to throw some yarn in a basket at work and her ankle popped. She then went to the MD, who told her she sprained it very badly and sent her from an MRI. She has been in the boot for 1 week and has to be in it for 4 more weeks. Her MD did not give her any exercises or anything to do besides stay in the boot.    Pertinent History no significant PMH    How long can you sit comfortably? unlimited    How long can you stand comfortably? 30 minutes with boot    How long can you walk comfortably? 60 minutes with the boot    Patient Stated Goals get back to PLOF    Currently in Pain? No/denies  at worst, can get to 8-9/10 pain             [facility] Assessment -  03/31/16 0001      Assessment   Medical Diagnosis R ankle/foot pain    Referring Provider Fuller CanadaStanley Harrison    Onset Date/Surgical Date 03/06/16   Next MD Visit Dr. Romeo AppleHarrison December 18th    Prior Therapy none      Precautions   Precaution Comments boot      Balance Screen   Has the patient fallen in the past 6 months No   Has the patient had a decrease in activity level because of a fear of falling?  No   Is the patient reluctant to leave their home because of a fear of falling?  No     Prior Function   Level of Independence Independent;Independent with basic ADLs;Independent with gait;Independent with transfers   Vocation Full time employment   Vocation Requirements [employer]      Observation/Other Assessments   Focus on Therapeutic Outcomes (FOTO)  51% limited      AROM   Right Ankle Dorsiflexion 1   Right Ankle Plantar Flexion --  wfl    Right Ankle Inversion 50   Right Ankle Eversion 15   Left Ankle Dorsiflexion 12  Left Ankle Plantar Flexion --  wfl    Left Ankle Inversion 52   Left Ankle Eversion 30     Strength   Right Hip ABduction 4+/5   Left Hip ABduction 4+/5   Right Knee Flexion 4/5   Right Knee Extension 4+/5   Left Knee Flexion 4+/5   Left Knee Extension 4+/5   Right Ankle Dorsiflexion 3+/5  pain limited    Right Ankle Inversion 3+/5  pain limited    Right Ankle Eversion 3+/5  pain limited    Left Ankle Dorsiflexion 4+/5   Left Ankle Inversion 5/5   Left Ankle Eversion 5/5     Palpation   Palpation comment tenderness noted medial R foot over area of medial malleolus, deltoid ligament, and down into midfoot area; hypomobility noted with gross foot/ankle mechanics R      Ambulation/Gait   Gait Comments reduced gait speed, proximal weakness, hip hiking due to boot                            PT Education - 03/31/16 1159    Education provided Yes   Education Details prognosis, POC, HEP; regular icing routine    Person(s) Educated Patient   Methods Explanation;Demonstration;Handout   Comprehension Verbalized understanding;Returned demonstration;Need further instruction          PT Short Term Goals - 03/31/16 1204      PT SHORT TERM GOAL #1   Title Patient to demonstrate R ankle ROM as being equal to that of L in order to improve mechanics and reduce pain    Time 3   Period Weeks   Status New     PT SHORT TERM GOAL #2   Title Patient to experience pain no more than 3/10 in order to improve tolerance to weight bearing activities    Time 3   Period Weeks   Status New     PT SHORT TERM GOAL #3   Title Patient to be independent in correctly and consistently perform appropriate HEP, to be updated PRN    Time 1   Period Weeks   Status New           PT Long Term Goals - 03/31/16 1206      PT LONG TERM GOAL #1   Title Patient to demonstrate functional strength as being 5/5 in all tested groups in order to improve mechanics and tolerance to weight bearing activities    Time 6   Period Weeks   Status New     PT LONG TERM GOAL #2   Title Patient to be able to ambulate unlimited distances outside of boot with no significant gait deviation in order to improve mechanics and prevent recurrence of pain    Time 6   Period Weeks   Status New     PT LONG TERM GOAL #3   Title Patient to be able to maintain SLS for at least 30 seconds each LE in order to show improved ankle stability and functional strength    Time 6   Period Weeks   Status New     PT LONG TERM GOAL #4   Title Patient to be able to perform all regular activities with R ankle/foot pain no more than 1/10 in order to assist in return to PLOF    Time 6   Period Weeks   Status New  Plan - 03/31/16 1202    Clinical Impression Statement Patient arrives approximately 3 weeks after sustaining an ankle injury at work; she states that her MD, Dr. Romeo AppleHarrison, has put her in a boot for weight bearing for 4 weeks  and she is also out of work for 4 weeks as well. Upon examination, patient does reveal gait impairment likely related to boot wear, general hypomobility and tenderness R foot/ankle, impaired functional strength, and R ankle/foot stiffness. Curiously, the majority of her R ankle pain/tenderness to palpation is medial around the area of the deltoid ligament/medial foot rather than lateral like a typical ankle sprain- PT to continue to monitor. She will benefit from skilled PT services in order to address functional impairments and reach optimal level of function.    Rehab Potential Excellent   Clinical Impairments Affecting Rehab Potential (+) no significant PMH/PSH   PT Frequency 2x / week   PT Duration 6 weeks   PT Treatment/Interventions ADLs/Self Care Home Management;Biofeedback;Cryotherapy;Moist Heat;DME Instruction;Gait training;Stair training;Functional mobility training;Therapeutic activities;Therapeutic exercise;Balance training;Neuromuscular re-education;Patient/family education;Manual techniques;Passive range of motion;Energy conservation;Taping   PT Next Visit Plan review HEP and goals; focus on ankle ROM/mobility and may initiate OKC strengthening if pain free, continue to encourage regular icing    PT Home Exercise Plan Eval: ankle circles, ankle alphabet, seated gastroc stretch    Consulted and Agree with Plan of Care Patient      Patient will benefit from skilled therapeutic intervention in order to improve the following deficits and impairments:  Abnormal gait, Improper body mechanics, Pain, Decreased coordination, Decreased mobility, Postural dysfunction, Decreased range of motion, Decreased strength, Hypomobility, Decreased balance, Difficulty walking, Impaired flexibility  Visit Diagnosis: Pain in right ankle and joints of right foot - Plan: PT plan of care cert/re-cert  Muscle weakness (generalized) - Plan: PT plan of care cert/re-cert  Stiffness of right ankle, not elsewhere  classified - Plan: PT plan of care cert/re-cert  Difficulty in walking, not elsewhere classified - Plan: PT plan of care cert/re-cert     Problem List Patient Active Problem List   Diagnosis Date Noted  . Encounter for Nexplanon removal 11/16/2015  . Flank pain 09/13/2013  . Nexplanon insertion 11/02/2012  . Rh negative status during pregnancy 07/21/2012    Nedra HaiKristen Amrita Radu PT, DPT [phone]  [facility] [address] [place], [place], [address] Phone: [phone]   Fax:  [phone]  Name: Kelly Watts MRN: [id number] Date of Birth: Jul 10, 1995

## 2016-04-03 ENCOUNTER — Ambulatory Visit (HOSPITAL_COMMUNITY): Payer: BLUE CROSS/BLUE SHIELD | Admitting: Physical Therapy

## 2016-04-03 DIAGNOSIS — M25571 Pain in right ankle and joints of right foot: Secondary | ICD-10-CM | POA: Diagnosis not present

## 2016-04-03 DIAGNOSIS — M6281 Muscle weakness (generalized): Secondary | ICD-10-CM

## 2016-04-03 DIAGNOSIS — M25671 Stiffness of right ankle, not elsewhere classified: Secondary | ICD-10-CM

## 2016-04-03 DIAGNOSIS — R262 Difficulty in walking, not elsewhere classified: Secondary | ICD-10-CM

## 2016-04-03 NOTE — Therapy (Signed)
[facility] [facility] [address] [place], [place], [address] Phone: [phone]   Fax:  [phone]  Physical Therapy Treatment  Patient Details  Name: Kelly JunglingCourtney A Watts MRN: [id number] Date of Birth: 12/19/1995 Referring Provider: Fuller CanadaStanley Harrison   Encounter Date: 04/03/2016      PT End of Session - 04/03/16 1436    Visit Number 2   Number of Visits 12   Date for PT Re-Evaluation 04/21/16   Authorization Type BCBS Other    Authorization Time Period 03/31/16 to 05/12/16   PT Start Time 1350   PT Stop Time 1428   PT Time Calculation (min) 38 min   Activity Tolerance Patient tolerated treatment well   Behavior During Therapy [facility] for tasks assessed/performed      Past Medical History:  Diagnosis Date  . Encounter for Nexplanon removal 11/16/2015  . Medical history non-contributory   . Nexplanon insertion 11/02/2012   Inserted left arm 11/02/12  . Patient underweight     Past Surgical History:  Procedure Laterality Date  . EAR TUBE REMOVAL    . WISDOM TOOTH EXTRACTION      There were no vitals filed for this visit.      Subjective Assessment - 04/03/16 1432    Subjective PT states she's been walking around her house some without her boot and without pain.  States she needs to get back to work ASAP. No pain currently   Currently in Pain? No/denies                         [facility] Adult PT Treatment/Exercise - 04/03/16 0001      Manual Therapy   Manual Therapy Passive ROM   Manual therapy comments completed seperately from all other skilled interventions   Passive ROM all ankle motions in painfree range     Ankle Exercises: Stretches   Gastroc Stretch 2 reps;30 seconds   Gastroc Stretch Limitations seated HEP review     Ankle Exercises: Seated   ABC's Other (comment)   ABC's Limitations HEP review   Ankle Circles/Pumps Other (comment)   Ankle Circles/Pumps Limitations HEP review   Heel Raises 10 reps   Toe  Raise 10 reps   BAPS Level 2;Sitting   BAPS Limitations all motions 10 reps each and CW/CCW                  PT Short Term Goals - 03/31/16 1204      PT SHORT TERM GOAL #1   Title Patient to demonstrate R ankle ROM as being equal to that of L in order to improve mechanics and reduce pain    Time 3   Period Weeks   Status New     PT SHORT TERM GOAL #2   Title Patient to experience pain no more than 3/10 in order to improve tolerance to weight bearing activities    Time 3   Period Weeks   Status New     PT SHORT TERM GOAL #3   Title Patient to be independent in correctly and consistently perform appropriate HEP, to be updated PRN    Time 1   Period Weeks   Status New           PT Long Term Goals - 03/31/16 1206      PT LONG TERM GOAL #1   Title Patient to demonstrate functional strength as being 5/5 in all tested groups in order to  improve mechanics and tolerance to weight bearing activities    Time 6   Period Weeks   Status New     PT LONG TERM GOAL #2   Title Patient to be able to ambulate unlimited distances outside of boot with no significant gait deviation in order to improve mechanics and prevent recurrence of pain    Time 6   Period Weeks   Status New     PT LONG TERM GOAL #3   Title Patient to be able to maintain SLS for at least 30 seconds each LE in order to show improved ankle stability and functional strength    Time 6   Period Weeks   Status New     PT LONG TERM GOAL #4   Title Patient to be able to perform all regular activities with R ankle/foot pain no more than 1/10 in order to assist in return to PLOF    Time 6   Period Weeks   Status New               Plan - 04/03/16 1437    Clinical Impression Statement Pt continues to use cam walker and reports no pain.  Reviewed HEP and goals per initial evaluation.  Pt given copy of evaluation.  Progressed AROM to include seated BAPS, heel and toe raises and PROM for all ankle motions.   Pt with most discomfort with dorsiflexion and inversion.  Instructed to add seated heel and toe raises to HEP.     Rehab Potential Excellent   Clinical Impairments Affecting Rehab Potential (+) no significant PMH/PSH   PT Frequency 2x / week   PT Duration 6 weeks   PT Treatment/Interventions ADLs/Self Care Home Management;Biofeedback;Cryotherapy;Moist Heat;DME Instruction;Gait training;Stair training;Functional mobility training;Therapeutic activities;Therapeutic exercise;Balance training;Neuromuscular re-education;Patient/family education;Manual techniques;Passive range of motion;Energy conservation;Taping   PT Next Visit Plan continue with focus on ankle ROM/mobility with progression to standing exercises without boot, gentle ROM.   continue to encourage regular icing    PT Home Exercise Plan Eval: ankle circles, ankle alphabet, seated gastroc stretch    Consulted and Agree with Plan of Care Patient      Patient will benefit from skilled therapeutic intervention in order to improve the following deficits and impairments:  Abnormal gait, Improper body mechanics, Pain, Decreased coordination, Decreased mobility, Postural dysfunction, Decreased range of motion, Decreased strength, Hypomobility, Decreased balance, Difficulty walking, Impaired flexibility  Visit Diagnosis: Pain in right ankle and joints of right foot  Muscle weakness (generalized)  Stiffness of right ankle, not elsewhere classified  Difficulty in walking, not elsewhere classified     Problem List Patient Active Problem List   Diagnosis Date Noted  . Encounter for Nexplanon removal 11/16/2015  . Flank pain 09/13/2013  . Nexplanon insertion 11/02/2012  . Rh negative status during pregnancy 07/21/2012    Lurena Nidamy B Frazier, PTA/CLT [phone] 04/03/2016, 2:43 PM  [place] [facility] [address] [place], [place], [address] Phone: [phone]   Fax:  [phone]  Name: Kelly JunglingCourtney A  Watts MRN: [id number] Date of Birth: Feb 22, 1996

## 2016-04-09 ENCOUNTER — Ambulatory Visit (HOSPITAL_COMMUNITY): Payer: BLUE CROSS/BLUE SHIELD | Attending: Orthopedic Surgery | Admitting: Physical Therapy

## 2016-04-09 DIAGNOSIS — M25571 Pain in right ankle and joints of right foot: Secondary | ICD-10-CM | POA: Insufficient documentation

## 2016-04-09 DIAGNOSIS — M6281 Muscle weakness (generalized): Secondary | ICD-10-CM | POA: Insufficient documentation

## 2016-04-09 DIAGNOSIS — R262 Difficulty in walking, not elsewhere classified: Secondary | ICD-10-CM | POA: Insufficient documentation

## 2016-04-09 DIAGNOSIS — M25671 Stiffness of right ankle, not elsewhere classified: Secondary | ICD-10-CM | POA: Insufficient documentation

## 2016-04-09 NOTE — Patient Instructions (Signed)
   ELASTIC BAND DORSIFLEXION - SUPINE  You can perform this lying on the floor face-up. Anchor one end of the elastic band in a door (tie a knot in the band and close a door on the band so that the knot is on the other side of the door).   Scoot back until there is tension on the band. Once there is some tension, move your ankle so that your toes and foot pull back and upwards towards pointing to the ceiling. Return to starting position and repeat.   Repeat 10-15 times, twice a day.    ELASTIC BAND PLANTARFLEXION - SUPINE  You can perform this lying on the floor face-up. Anchor one end of the elastic band in your hand and place a looped end around your target foot.  Next, hold the band and pull it to provide some tension in the band. Then move your target ankle/foot forward, or plantarflex your ankle. This is the same motion as when pressing down on a gas pedal of a car.   Return to starting position and repeat 10-15 times, twice a day.    ELASTIC BAND INVERSION - SUPINE  Start by lying on your back and place a looped end of an elastic band around your target foot. Next, cross your legs so that the target leg is on the bottom and part of the elastic band is held down by the top foot. Hold the other end in your hand.   Next, move your target ankle so that the toes and foot move to the inside or toward your mid-line.  Return to starting position and repeat 10-15 times, twice a day.    ELASTIC BAND EVERSION - SUPINE  Start by lying on your back and place a looped end of an elastic band around your target foot. Hold down the band with your other foot and then hold the other end in your hand.   Next, move your target ankle so that the toes and foot move to the side or  away from your mid-line.  Return to starting position and repeat 10-15 times, twice a day.

## 2016-04-09 NOTE — Therapy (Signed)
[facility] [address] , [place], [address] Phone: [phone]   Fax:  [phone]  Physical Therapy Treatment  Patient Details  Name: Kelly Watts MRN: [id number] Date of Birth: 1995-08-23 Referring Provider: Fuller CanadaStanley Harrison   Encounter Date: 04/09/2016      PT End of Session - 04/09/16 1211    Visit Number 3   Number of Visits 12   Date for PT Re-Evaluation 04/21/16   Authorization Type BCBS Other    Authorization Time Period 03/31/16 to 05/12/16   PT Start Time 1119   PT Stop Time 1153  goals for this session achieved, ended early also due to introduction of numerous new exercises/activities to establish patient tolerance    PT Time Calculation (min) 34 min   Activity Tolerance Patient tolerated treatment well   Behavior During Therapy [facility] for tasks assessed/performed      Past Medical History:  Diagnosis Date  . Encounter for Nexplanon removal 11/16/2015  . Medical history non-contributory   . Nexplanon insertion 11/02/2012   Inserted left arm 11/02/12  . Patient underweight     Past Surgical History:  Procedure Laterality Date  . EAR TUBE REMOVAL    . WISDOM TOOTH EXTRACTION      There were no vitals filed for this visit.      Subjective Assessment - 04/09/16 1121    Subjective Patient arrives stating she is doing well, nothing major going on. She continues to be able to walk short distances outside of her boot at home.    Pertinent History no significant PMH    Patient Stated Goals get back to PLOF    Currently in Pain? No/denies                         [facility] Adult PT Treatment/Exercise - 04/09/16 0001      Ankle Exercises: Seated   ABC's 2 reps   ABC's Limitations warmup    Ankle Circles/Pumps 15 reps   Ankle Circles/Pumps Limitations CW/CCW warmup    Towel Inversion/Eversion 5 reps   Other Seated Ankle Exercises foot doming 1x15      Ankle Exercises: Supine   T-Band ankle DF,  PF, inversion, eversion 1x10 red TB      Ankle Exercises: Standing   SLS 3x10   Heel Raises 10 reps   Toe Raise 10 reps   Other Standing Ankle Exercises heel-toe gait training 4250ftx2; rocking heel-toe pattern 1x10 solid surface    Other Standing Ankle Exercises lateral weight shifts on foam 1x20; tandem walks 4x6615ft                  PT Education - 04/09/16 1210    Education provided Yes   Education Details updated HEP, possible DOMS, call MD to check about being able to wean out of boot    Person(s) Educated Patient   Methods Explanation   Comprehension Verbalized understanding          PT Short Term Goals - 03/31/16 1204      PT SHORT TERM GOAL #1   Title Patient to demonstrate R ankle ROM as being equal to that of L in order to improve mechanics and reduce pain    Time 3   Period Weeks   Status New     PT SHORT TERM GOAL #2   Title Patient to experience pain no more than 3/10 in order to improve tolerance to weight  bearing activities    Time 3   Period Weeks   Status New     PT SHORT TERM GOAL #3   Title Patient to be independent in correctly and consistently perform appropriate HEP, to be updated PRN    Time 1   Period Weeks   Status New           PT Long Term Goals - 03/31/16 1206      PT LONG TERM GOAL #1   Title Patient to demonstrate functional strength as being 5/5 in all tested groups in order to improve mechanics and tolerance to weight bearing activities    Time 6   Period Weeks   Status New     PT LONG TERM GOAL #2   Title Patient to be able to ambulate unlimited distances outside of boot with no significant gait deviation in order to improve mechanics and prevent recurrence of pain    Time 6   Period Weeks   Status New     PT LONG TERM GOAL #3   Title Patient to be able to maintain SLS for at least 30 seconds each LE in order to show improved ankle stability and functional strength    Time 6   Period Weeks   Status New     PT  LONG TERM GOAL #4   Title Patient to be able to perform all regular activities with R ankle/foot pain no more than 1/10 in order to assist in return to PLOF    Time 6   Period Weeks   Status New               Plan - 04/09/16 1212    Clinical Impression Statement Continued with skilled PT services with shift of focus from mobility to introduction of functional strengthening in OKC and CKC positions today, also introduced increased focus on CKC based activities outside of boot as patient remains pain free throughout session, however does experience significant muscle fatigue throughout session. Updated HEP today with addition of strengthening exercises with TB today as well. No increase in pain throughout session.    Rehab Potential Excellent   Clinical Impairments Affecting Rehab Potential (+) no significant PMH/PSH   PT Frequency 2x / week   PT Duration 6 weeks   PT Treatment/Interventions ADLs/Self Care Home Management;Biofeedback;Cryotherapy;Moist Heat;DME Instruction;Gait training;Stair training;Functional mobility training;Therapeutic activities;Therapeutic exercise;Balance training;Neuromuscular re-education;Patient/family education;Manual techniques;Passive range of motion;Energy conservation;Taping   PT Next Visit Plan continue ROM exercises as warmup, continue progression of functional strength in OKC and CKC positions without boot, balance work   PT Home Exercise Plan Eval: ankle circles, ankle alphabet, seated gastroc stretch    Consulted and Agree with Plan of Care Patient      Patient will benefit from skilled therapeutic intervention in order to improve the following deficits and impairments:  Abnormal gait, Improper body mechanics, Pain, Decreased coordination, Decreased mobility, Postural dysfunction, Decreased range of motion, Decreased strength, Hypomobility, Decreased balance, Difficulty walking, Impaired flexibility  Visit Diagnosis: Pain in right ankle and joints of  right foot  Muscle weakness (generalized)  Stiffness of right ankle, not elsewhere classified  Difficulty in walking, not elsewhere classified     Problem List Patient Active Problem List   Diagnosis Date Noted  . Encounter for Nexplanon removal 11/16/2015  . Flank pain 09/13/2013  . Nexplanon insertion 11/02/2012  . Rh negative status during pregnancy 07/21/2012    Nedra Hai PT, DPT [phone]  [facility] [address] [place], [place], [address] Phone: [phone]   Fax:  [phone]  Name: Kelly Watts MRN: [id number] Date of Birth: [birth year]

## 2016-04-11 ENCOUNTER — Ambulatory Visit (HOSPITAL_COMMUNITY): Payer: BLUE CROSS/BLUE SHIELD

## 2016-04-11 DIAGNOSIS — R262 Difficulty in walking, not elsewhere classified: Secondary | ICD-10-CM

## 2016-04-11 DIAGNOSIS — M6281 Muscle weakness (generalized): Secondary | ICD-10-CM

## 2016-04-11 DIAGNOSIS — M25571 Pain in right ankle and joints of right foot: Secondary | ICD-10-CM

## 2016-04-11 DIAGNOSIS — M25671 Stiffness of right ankle, not elsewhere classified: Secondary | ICD-10-CM

## 2016-04-11 NOTE — Therapy (Signed)
[facility] [address] [place], [place], [address] Phone: [phone]   Fax:  [phone]  Physical Therapy Treatment  Patient Details  Name: Kelly Watts MRN: [id number] Date of Birth: 08/27/1995 Referring Provider: Stanley Harrison   Encounter Date: 04/11/2016      PT End of Session - 04/11/16 1001    Visit Number 4   Number of Visits 12   Date for PT Re-Evaluation 04/21/16   Authorization Type BCBS Other    Authorization Time Period 03/31/16 to 05/12/16   PT Start Time 0952   PT Stop Time 1030   PT Time Calculation (min) 38 min   Activity Tolerance Patient tolerated treatment well;No increased pain   Behavior During Therapy WFL for tasks assessed/performed      Past Medical History:  Diagnosis Date  . Encounter for Nexplanon removal 11/16/2015  . Medical history non-contributory   . Nexplanon insertion 11/02/2012   Inserted left arm 11/02/12  . Patient underweight     Past Surgical History:  Procedure Laterality Date  . EAR TUBE REMOVAL    . WISDOM TOOTH EXTRACTION      There were no vitals filed for this visit.      Subjective Assessment - 04/11/16 0954    Subjective Pt is doign well today, mild pain continues with HEP, but she is gettign everthing done. She says all of th ewlaking without the boot is still going ok.    Pertinent History no significant PMH    Currently in Pain? No/denies                         [facility] Adult PT Treatment/Exercise - 04/11/16 0001      Ankle Exercises: Seated   Ankle Circles/Pumps AROM;20 reps  20xCW;20x CCW   Ankle Circles/Pumps Limitations VC for fluid smooth circles   Heel Raises 15 reps  2x20, toes on 8' step seated.   Toe Raise 15 reps  2x15, heels on step, seated     Ankle Exercises: Standing   SLS 10x10sec alt bilat   Heel Raises --  Single Leg Heel Raise: Lt: 25x; Rt: 3x12 (max 15)   Toe Raise --  *see above   Tai Chi Squat: 2x15   down to 14"  box, normal stance, VC for knees over ankles   Other Standing Ankle Exercises Narrow stance, gross toe extension: 15x5sec hold  R Arch doming, narrow stance, with toe ext assist: 10x   Other Standing Ankle Exercises SLS foam: 5x5sec 2HHA                  PT Short Term Goals - 03/31/16 1204      PT SHORT TERM GOAL #1   Title Patient to demonstrate R ankle ROM as being equal to that of L in order to improve mechanics and reduce pain    Time 3   Period Weeks   Status New     PT SHORT TERM GOAL #2   Title Patient to experience pain no more than 3/10 in order to improve tolerance to weight bearing activities    Time 3   Period Weeks   Status New     PT SHORT TERM GOAL #3   Title Patient to be independent in correctly and consistently perform appropriate HEP, to be updated PRN    Time 1   Period Weeks   Status New             PT Long Term Goals - 03/31/16 1206      PT LONG TERM GOAL #1   Title Patient to demonstrate functional strength as being 5/5 in all tested groups in order to improve mechanics and tolerance to weight bearing activities    Time 6   Period Weeks   Status New     PT LONG TERM GOAL #2   Title Patient to be able to ambulate unlimited distances outside of boot with no significant gait deviation in order to improve mechanics and prevent recurrence of pain    Time 6   Period Weeks   Status New     PT LONG TERM GOAL #3   Title Patient to be able to maintain SLS for at least 30 seconds each LE in order to show improved ankle stability and functional strength    Time 6   Period Weeks   Status New     PT LONG TERM GOAL #4   Title Patient to be able to perform all regular activities with R ankle/foot pain no more than 1/10 in order to assist in return to PLOF    Time 6   Period Weeks   Status New               Plan - 04/11/16 1005    Clinical Impression Statement Pt doing well with recovery and progress toward goals. Swelling appears to  have resolved back to WNL. Mild navicular drop and avoidance of use of peroneals in SLS noted and addressed this session. Mild fatigue limitations in the ankle this session. No chnages to POC at this time.  SL heel raises to failure demonstrtate a >50% strength differential from Left to Right.    Rehab Potential Excellent   Clinical Impairments Affecting Rehab Potential (+) no significant PMH/PSH   PT Frequency 2x / week   PT Duration 6 weeks   PT Treatment/Interventions ADLs/Self Care Home Management;Biofeedback;Cryotherapy;Moist Heat;DME Instruction;Gait training;Stair training;Functional mobility training;Therapeutic activities;Therapeutic exercise;Balance training;Neuromuscular re-education;Patient/family education;Manual techniques;Passive range of motion;Energy conservation;Taping   PT Next Visit Plan continue ROM exercises as warmup, continue progression of functional strength in OKC and CKC positions without boot, balance work   PT Home Exercise Plan Eval: ankle circles, ankle alphabet, seated gastroc stretch       Patient will benefit from skilled therapeutic intervention in order to improve the following deficits and impairments:  Abnormal gait, Improper body mechanics, Pain, Decreased coordination, Decreased mobility, Postural dysfunction, Decreased range of motion, Decreased strength, Hypomobility, Decreased balance, Difficulty walking, Impaired flexibility  Visit Diagnosis: Pain in right ankle and joints of right foot  Muscle weakness (generalized)  Stiffness of right ankle, not elsewhere classified  Difficulty in walking, not elsewhere classified     Problem List Patient Active Problem List   Diagnosis Date Noted  . Encounter for Nexplanon removal 11/16/2015  . Flank pain 09/13/2013  . Nexplanon insertion 11/02/2012  . Rh negative status during pregnancy 07/21/2012    10:35 AM, 04/11/16 Allan C Buccola, PT, DPT Physical Therapist at [facility] [phone] (office)     [facility] [address] [place], [place], [address] Phone: [phone]   Fax:  [phone]  Name: Kelly Watts MRN: [id number] Date of Birth: 07/15/1995    

## 2016-04-14 ENCOUNTER — Ambulatory Visit (HOSPITAL_COMMUNITY): Payer: BLUE CROSS/BLUE SHIELD | Admitting: Physical Therapy

## 2016-04-14 DIAGNOSIS — M25571 Pain in right ankle and joints of right foot: Secondary | ICD-10-CM | POA: Diagnosis not present

## 2016-04-14 DIAGNOSIS — M25671 Stiffness of right ankle, not elsewhere classified: Secondary | ICD-10-CM

## 2016-04-14 DIAGNOSIS — R262 Difficulty in walking, not elsewhere classified: Secondary | ICD-10-CM

## 2016-04-14 DIAGNOSIS — M6281 Muscle weakness (generalized): Secondary | ICD-10-CM

## 2016-04-14 NOTE — Therapy (Signed)
[facility] [address] [place], [place], [address] Phone: [phone]   Fax:  [phone]  Physical Therapy Treatment  Patient Details  Name: Kelly JunglingCourtney A James MRN: [id number] Date of Birth: Jan 15, 1996 Referring Provider: Fuller CanadaStanley Harrison   Encounter Date: 04/14/2016      PT End of Session - 04/14/16 1113    Visit Number 5   Number of Visits 12   Date for PT Re-Evaluation 04/21/16   Authorization Type BCBS Other    Authorization Time Period 03/31/16 to 05/12/16   PT Start Time 1038  patient arrived late    PT Stop Time 1113   PT Time Calculation (min) 35 min   Activity Tolerance Patient tolerated treatment well   Behavior [facility] tasks assessed/performed      Past Medical History:  Diagnosis Date  . Encounter for Nexplanon removal 11/16/2015  . Medical history non-contributory   . Nexplanon insertion 11/02/2012   Inserted left arm 11/02/12  . Patient underweight     Past Surgical History:  Procedure Laterality Date  . EAR TUBE REMOVAL    . WISDOM TOOTH EXTRACTION      There were no vitals filed for this visit.      Subjective Assessment - 04/14/16 1040    Subjective Patient arrives today a few minutes late. She is doing well, she is still feeling a bit of muscle soreness after last session. Her MD took her out of the boot when she called the other day per DPT advice.    Pertinent History no significant PMH    Currently in Pain? No/denies  still feeling a little bit of muscle soreness from last session, no true pain                          [facility] Adult PT Treatment/Exercise - 04/14/16 0001      Ankle Exercises: Supine   T-Band ankle DF, PF, inversion, eversion 1x10 green TB      Ankle Exercises: Seated   ABC's 2 reps   ABC's Limitations warmup    Ankle Circles/Pumps 15 reps   Ankle Circles/Pumps Limitations CW/CCW  warmup    Towel Crunch 3 reps   Other Seated Ankle Exercises foot  doming 1x15      Ankle Exercises: Standing   SLS 10-15 seconds max B foam    Rocker Board 2 minutes  AP and lateral intermittent UEs    Heel Raises 15 reps   Toe Raise 15 reps   Heel Walk (Round Trip) 4  4715ftx4   Toe Walk (Round Trip) 4  5915ftx4    Other Standing Ankle Exercises 4 point star touches 1x10 B; lateral weight shifts on air pads 1x15; step ups on air pads 1x15 each side; tandem stance on air pads x3 each way    no UEs                 PT Education - 04/14/16 1113    Education provided No          PT Short Term Goals - 03/31/16 1204      PT SHORT TERM GOAL #1   Title Patient to demonstrate R ankle ROM as being equal to that of L in order to improve mechanics and reduce pain    Time 3   Period Weeks   Status New     PT SHORT TERM GOAL #2   Title Patient  to experience pain no more than 3/10 in order to improve tolerance to weight bearing activities    Time 3   Period Weeks   Status New     PT SHORT TERM GOAL #3   Title Patient to be independent in correctly and consistently perform appropriate HEP, to be updated PRN    Time 1   Period Weeks   Status New           PT Long Term Goals - 03/31/16 1206      PT LONG TERM GOAL #1   Title Patient to demonstrate functional strength as being 5/5 in all tested groups in order to improve mechanics and tolerance to weight bearing activities    Time 6   Period Weeks   Status New     PT LONG TERM GOAL #2   Title Patient to be able to ambulate unlimited distances outside of boot with no significant gait deviation in order to improve mechanics and prevent recurrence of pain    Time 6   Period Weeks   Status New     PT LONG TERM GOAL #3   Title Patient to be able to maintain SLS for at least 30 seconds each LE in order to show improved ankle stability and functional strength    Time 6   Period Weeks   Status New     PT LONG TERM GOAL #4   Title Patient to be able to perform all regular activities  with R ankle/foot pain no more than 1/10 in order to assist in return to PLOF    Time 6   Period Weeks   Status New               Plan - 04/14/16 1114    Clinical Impression Statement Patient arrives today stating she is a little sore after last session, mostly muscle soreness rather than sharp pain. Continued with mobility warmup then proceeded with continuation of functional strength and balance today to patient tolerance with cues for form  provided as needed. She reports that her MD allowed her to take off the boot due to her reports of being pain free when she called per DPT advice last week.    Rehab Potential Excellent   Clinical Impairments Affecting Rehab Potential (+) no significant PMH/PSH   PT Frequency 2x / week   PT Duration 6 weeks   PT Treatment/Interventions ADLs/Self Care Home Management;Biofeedback;Cryotherapy;Moist Heat;DME Instruction;Gait training;Stair training;Functional mobility training;Therapeutic activities;Therapeutic exercise;Balance training;Neuromuscular re-education;Patient/family education;Manual techniques;Passive range of motion;Energy conservation;Taping   PT Next Visit Plan continue ROM exercises as warmup, continue progression of functional strength in OKC and CKC positions without boot, balance work   PT Home Exercise Plan Eval: ankle circles, ankle alphabet, seated gastroc stretch    Consulted and Agree with Plan of Care Patient      Patient will benefit from skilled therapeutic intervention in order to improve the following deficits and impairments:  Abnormal gait, Improper body mechanics, Pain, Decreased coordination, Decreased mobility, Postural dysfunction, Decreased range of motion, Decreased strength, Hypomobility, Decreased balance, Difficulty walking, Impaired flexibility  Visit Diagnosis: Pain in right ankle and joints of right foot  Muscle weakness (generalized)  Stiffness of right ankle, not elsewhere classified  Difficulty in  walking, not elsewhere classified     Problem List Patient Active Problem List   Diagnosis Date Noted  . Encounter for Nexplanon removal 11/16/2015  . Flank pain 09/13/2013  . Nexplanon insertion 11/02/2012  . Rh  negative status during pregnancy 07/21/2012    Nedra Hai PT, DPT [phone]  [facility] [address] [place], [place], [address] Phone: [phone]   Fax:  [phone]  Name: MAANASA ADERHOLD MRN: [id number] Date of Birth: Jan 11, 1996

## 2016-04-16 ENCOUNTER — Ambulatory Visit (HOSPITAL_COMMUNITY): Payer: BLUE CROSS/BLUE SHIELD | Admitting: Physical Therapy

## 2016-04-16 DIAGNOSIS — R262 Difficulty in walking, not elsewhere classified: Secondary | ICD-10-CM

## 2016-04-16 DIAGNOSIS — M6281 Muscle weakness (generalized): Secondary | ICD-10-CM

## 2016-04-16 DIAGNOSIS — M25571 Pain in right ankle and joints of right foot: Secondary | ICD-10-CM

## 2016-04-16 DIAGNOSIS — M25671 Stiffness of right ankle, not elsewhere classified: Secondary | ICD-10-CM

## 2016-04-16 NOTE — Therapy (Signed)
[facility] [place], [place], [address] Phone: [phone]   Fax:  [phone]  Physical Therapy Treatment/Discharge  Patient Details  Name: AMARRA SAWYER MRN: [id number] Date of Birth: 05-Jul-1995 Referring Provider: Arther Abbott, MD  Encounter Date: 04/16/2016      PT End of Session - 04/16/16 1158    Visit Number 6   Number of Visits 12   Date for PT Re-Evaluation 04/21/16   Authorization Type BCBS Other    Authorization Time Period 03/31/16 to 05/12/16   PT Start Time 1116   PT Stop Time 1150   PT Time Calculation (min) 34 min   Activity Tolerance Patient tolerated treatment well;No increased pain   Behavior During Therapy WFL for tasks assessed/performed      Past Medical History:  Diagnosis Date  . Encounter for Nexplanon removal 11/16/2015  . Medical history non-contributory   . Nexplanon insertion 11/02/2012   Inserted left arm 11/02/12  . Patient underweight     Past Surgical History:  Procedure Laterality Date  . EAR TUBE REMOVAL    . WISDOM TOOTH EXTRACTION      There were no vitals filed for this visit.      Subjective Assessment - 04/16/16 1121    Subjective Pt states that her ankle is doing well. She feels it is about 80% improved since beginning PT. She reports no pain currently.    Pertinent History no significant PMH    How long can you sit comfortably? unlimited    How long can you stand comfortably? unlimited    How long can you walk comfortably? unlimited    Patient Stated Goals get back to PLOF    Currently in Pain? No/denies            [facility] PT Assessment - 04/16/16 0001      Assessment   Medical Diagnosis R ankle/foot pain    Referring Provider Arther Abbott, MD   Onset Date/Surgical Date 03/06/16   Next MD Visit 04/21/16   Prior Therapy none      Balance Screen   Has the patient fallen in the past 6 months No   Has the patient had a decrease in activity level  because of a fear of falling?  No   Is the patient reluctant to leave their home because of a fear of falling?  No     Prior Function   Level of Independence Independent;Independent with basic ADLs;Independent with gait;Independent with transfers   Vocation Full time employment   Vocation Requirements [employer]      Observation/Other Assessments   Focus on Therapeutic Outcomes (FOTO)  11% limited      Functional Tests   Functional tests Single leg stance     Single Leg Stance   Comments SLS up to 20 sec each without LOB     AROM   Right Ankle Dorsiflexion 14   Right Ankle Eversion 10   Left Ankle Dorsiflexion 13   Left Ankle Eversion 10     Strength   Right Hip ABduction 5/5   Left Hip ABduction 5/5   Right Knee Flexion 5/5   Right Knee Extension 5/5   Left Knee Flexion 5/5   Left Knee Extension 5/5   Right Ankle Dorsiflexion 5/5   Right Ankle Inversion 5/5   Right Ankle Eversion 5/5   Left Ankle Dorsiflexion 5/5   Left Ankle Inversion 5/5   Left Ankle Eversion 5/5  Palpation   Palpation comment non tender with palpation                      [facility] Adult PT Treatment/Exercise - 04/16/16 0001      Ankle Exercises: Seated   Other Seated Ankle Exercises arch lifts x3 sec hold, 10 reps    Other Seated Ankle Exercises 4 way ankle with green TB x15 reps      SLS 1x30 sec each            PT Education - 04/16/16 1157    Education provided Yes   Education Details discussed goals and progress made since beginning PT; updated HEP for continued adherence at home   Person(s) Educated Patient   Methods Explanation;Demonstration;Handout   Comprehension Verbalized understanding;Returned demonstration          PT Short Term Goals - 04/16/16 1159      PT SHORT TERM GOAL #1   Title Patient to demonstrate R ankle ROM as being equal to that of L in order to improve mechanics and reduce pain    Time 3   Period Weeks   Status Achieved     PT  SHORT TERM GOAL #2   Title Patient to experience pain no more than 3/10 in order to improve tolerance to weight bearing activities    Time 3   Period Weeks   Status Achieved     PT SHORT TERM GOAL #3   Title Patient to be independent in correctly and consistently perform appropriate HEP, to be updated PRN    Time 1   Period Weeks   Status Achieved           PT Long Term Goals - 04/16/16 1159      PT LONG TERM GOAL #1   Title Patient to demonstrate functional strength as being 5/5 in all tested groups in order to improve mechanics and tolerance to weight bearing activities    Time 6   Period Weeks   Status Achieved     PT LONG TERM GOAL #2   Title Patient to be able to ambulate unlimited distances outside of boot with no significant gait deviation in order to improve mechanics and prevent recurrence of pain    Time 6   Period Weeks   Status Achieved     PT LONG TERM GOAL #3   Title Patient to be able to maintain SLS for at least 30 seconds each LE in order to show improved ankle stability and functional strength    Time 6   Period Weeks   Status Achieved     PT LONG TERM GOAL #4   Title Patient to be able to perform all regular activities with R ankle/foot pain no more than 1/10 in order to assist in return to PLOF    Time 6   Period Weeks   Status Achieved               Plan - 04/16/16 [place] was reassessed this visit having met all of her goals and reporting 80% improvement in pain and function since beginning PT. She demonstrates full/pain free ankle ROM, full/pain free ankle strength and improve proprioception on each LE without LOB or pain. She reports that she has resumed all activity she was performing prior to her injury without difficulty, aside from work activity secondary to MD orders to hold until her next appointment. At this time,  she is independent with her HEP and is comfortable continuing with maintenance at  home. She will be discharged from PT and return to work once cleared by referring MD.    Rehab Potential Excellent   Clinical Impairments Affecting Rehab Potential (+) no significant PMH/PSH   PT Frequency 2x / week   PT Duration 6 weeks   PT Treatment/Interventions ADLs/Self Care Home Management;Biofeedback;Cryotherapy;Moist Heat;DME Instruction;Gait training;Stair training;Functional mobility training;Therapeutic activities;Therapeutic exercise;Balance training;Neuromuscular re-education;Patient/family education;Manual techniques;Passive range of motion;Energy conservation;Taping   PT Next Visit Plan d/c this visit    PT Home Exercise Plan ankle 4 way therex with green TB, arch lifts, SLS    Consulted and Agree with Plan of Care Patient      Patient will benefit from skilled therapeutic intervention in order to improve the following deficits and impairments:  Abnormal gait, Improper body mechanics, Pain, Decreased coordination, Decreased mobility, Postural dysfunction, Decreased range of motion, Decreased strength, Hypomobility, Decreased balance, Difficulty walking, Impaired flexibility  Visit Diagnosis: Pain in right ankle and joints of right foot  Muscle weakness (generalized)  Stiffness of right ankle, not elsewhere classified  Difficulty in walking, not elsewhere classified    PHYSICAL THERAPY DISCHARGE SUMMARY  Visits from Start of Care: 6  Current functional level related to goals / functional outcomes: Pain free, 5/5 strength, ankle ROM symmetrical and WNL, SLS up to 20 sec without LOB on BLE   Remaining deficits: None noted at this time.     Education / Equipment: discussed goals and progress made since beginning PT; updated HEP for continued adherence at home Plan: Patient agrees to discharge.  Patient goals were met. Patient is being discharged due to meeting the stated rehab goals.  ?????         Problem List Patient Active Problem List   Diagnosis Date  Noted  . Encounter for Nexplanon removal 11/16/2015  . Flank pain 09/13/2013  . Nexplanon insertion 11/02/2012  . Rh negative status during pregnancy 07/21/2012   12:10 PM,04/16/16 Elly Modena PT, DPT [facility] [address] [place], [place], [address] Phone: [phone]   Fax:  [phone]  Name: DAYLA GASCA MRN: [id number] Date of Birth: August 10, 1995

## 2016-04-21 ENCOUNTER — Ambulatory Visit (HOSPITAL_COMMUNITY): Payer: BLUE CROSS/BLUE SHIELD | Admitting: Physical Therapy

## 2016-04-21 ENCOUNTER — Encounter: Payer: Self-pay | Admitting: Orthopedic Surgery

## 2016-04-21 ENCOUNTER — Ambulatory Visit (INDEPENDENT_AMBULATORY_CARE_PROVIDER_SITE_OTHER): Payer: BLUE CROSS/BLUE SHIELD | Admitting: Orthopedic Surgery

## 2016-04-21 DIAGNOSIS — M25571 Pain in right ankle and joints of right foot: Secondary | ICD-10-CM

## 2016-04-21 NOTE — Patient Instructions (Signed)
Patient will return to work on December 19

## 2016-04-21 NOTE — Progress Notes (Signed)
Patient ID: Filomena Junglingourtney A James, female   DOB: [birth year], 20 y.o.   MRN: [id number]  Chief Complaint  Patient presents with  . Follow-up    right ankle/foot pain    HPI Filomena JunglingCourtney A James is a 20 y.o. female.   HPI  20 year old female right foot hit against a metal plate present with negative x-ray and MRI. Treated with Cam Walker weightbearing as tolerated in physical therapy and no work for 4 weeks comes in for recheck  Review of Systems Review of Systems  No complaints of numbness or tingling in the foot  Physical Exam  Constitutional: She is oriented to person, place, and time. She appears well-developed and well-nourished.  Cardiovascular: Intact distal pulses.   Musculoskeletal:       Right ankle: She exhibits normal range of motion, no swelling, no ecchymosis, no deformity and no laceration. No lateral malleolus, no medial malleolus, no AITFL, no CF ligament and no posterior TFL tenderness found.       Right foot: There is normal range of motion, no tenderness, no bony tenderness, no swelling, normal capillary refill, no crepitus and no deformity.  Neurological: She is alert and oriented to person, place, and time.  Skin: Skin is warm and dry. Capillary refill takes less than 2 seconds. No rash noted. No erythema. No pallor.  Psychiatric: She has a normal mood and affect. Her behavior is normal. Judgment and thought content normal.    Encounter Diagnosis  Name Primary?  . Pain in joint involving right ankle and foot Yes    The patient is asymptomatic she is walking normally in normal shoes now and can return to work tomorrow  Follow-up as needed

## 2016-04-23 ENCOUNTER — Ambulatory Visit (HOSPITAL_COMMUNITY): Payer: BLUE CROSS/BLUE SHIELD | Admitting: Physical Therapy

## 2016-04-30 ENCOUNTER — Ambulatory Visit (HOSPITAL_COMMUNITY): Payer: BLUE CROSS/BLUE SHIELD

## 2016-05-02 ENCOUNTER — Encounter (HOSPITAL_COMMUNITY): Payer: BLUE CROSS/BLUE SHIELD

## 2016-05-06 ENCOUNTER — Encounter (HOSPITAL_COMMUNITY): Payer: BLUE CROSS/BLUE SHIELD | Admitting: Physical Therapy

## 2016-08-25 ENCOUNTER — Encounter: Payer: Self-pay | Admitting: Adult Health

## 2016-08-25 ENCOUNTER — Ambulatory Visit (INDEPENDENT_AMBULATORY_CARE_PROVIDER_SITE_OTHER): Payer: BLUE CROSS/BLUE SHIELD | Admitting: Adult Health

## 2016-08-25 VITALS — BP 90/60 | HR 72 | Ht 63.0 in | Wt 102.0 lb

## 2016-08-25 DIAGNOSIS — Z975 Presence of (intrauterine) contraceptive device: Secondary | ICD-10-CM

## 2016-08-25 DIAGNOSIS — N926 Irregular menstruation, unspecified: Secondary | ICD-10-CM | POA: Diagnosis not present

## 2016-08-25 DIAGNOSIS — R5383 Other fatigue: Secondary | ICD-10-CM

## 2016-08-25 DIAGNOSIS — Z3202 Encounter for pregnancy test, result negative: Secondary | ICD-10-CM

## 2016-08-25 DIAGNOSIS — N921 Excessive and frequent menstruation with irregular cycle: Secondary | ICD-10-CM | POA: Diagnosis not present

## 2016-08-25 LAB — POCT URINE PREGNANCY: Preg Test, Ur: NEGATIVE

## 2016-08-25 MED ORDER — MEGESTROL ACETATE 40 MG PO TABS: ORAL_TABLET | ORAL | 1 refills | Status: DC

## 2016-08-25 NOTE — Progress Notes (Signed)
Subjective:     Patient ID: Kelly Watts, female   DOB: 1995-08-28, 21 y.o.   MRN: [id number]  HPI Kelly Watts is a 21 year old white female in complaining of heavy bleeding on nexpalnon and headaches and is tired when on period.   Review of Systems +headaches +heavy bleeding on nexplanon Tired when on period  Sexually active, no problems there  Reviewed past medical,surgical, social and family history. Reviewed medications and allergies.     Objective:   Physical Exam BP 90/60 (BP Location: Right Arm, Patient Position: Sitting, Cuff Size: Small)   Pulse 72   Ht  (1.6 m)   Wt 102 lb (46.3 kg)   LMP 07/25/2016   BMI 18.07 kg/m UPT negative, Skin warm and dry.  Lungs: clear to ausculation bilaterally. Cardiovascular: regular rate and rhythm.    Assessment:     1. Menorrhagia with irregular cycle   2. Nexplanon in place   3. Irregular bleeding   4. Pregnancy examination or test, negative result   5.      Tired     Plan:    GC/CHL sent on urine Check CBC and TSH Rx megace 40 mg #45 3 x 5 days then 2 x 5 days then 1 daily with 1 refill Take 2 flintstones complete with iron Follow up in 2 weeks

## 2016-08-26 LAB — CBC
HEMATOCRIT: 41.7 % (ref 34.0–46.6)
HEMOGLOBIN: 14.5 g/dL (ref 11.1–15.9)
MCH: 31.7 pg (ref 26.6–33.0)
MCHC: 34.8 g/dL (ref 31.5–35.7)
MCV: 91 fL (ref 79–97)
PLATELETS: 273 10*3/uL (ref 150–379)
RBC: 4.57 x10E6/uL (ref 3.77–5.28)
RDW: 12.6 % (ref 12.3–15.4)
WBC: 7.4 10*3/uL (ref 3.4–10.8)

## 2016-08-26 LAB — TSH: TSH: 0.9 u[IU]/mL (ref 0.450–4.500)

## 2016-08-27 LAB — GC/CHLAMYDIA PROBE AMP
Chlamydia trachomatis, NAA: NEGATIVE
Neisseria gonorrhoeae by PCR: NEGATIVE

## 2016-09-08 ENCOUNTER — Encounter: Payer: Self-pay | Admitting: Adult Health

## 2016-09-08 ENCOUNTER — Ambulatory Visit (INDEPENDENT_AMBULATORY_CARE_PROVIDER_SITE_OTHER): Payer: BLUE CROSS/BLUE SHIELD | Admitting: Adult Health

## 2016-09-08 VITALS — BP 84/58 | HR 60 | Ht 63.0 in | Wt 98.0 lb

## 2016-09-08 DIAGNOSIS — Z975 Presence of (intrauterine) contraceptive device: Secondary | ICD-10-CM | POA: Diagnosis not present

## 2016-09-08 DIAGNOSIS — R51 Headache: Secondary | ICD-10-CM | POA: Diagnosis not present

## 2016-09-08 DIAGNOSIS — R519 Headache, unspecified: Secondary | ICD-10-CM

## 2016-09-08 NOTE — Progress Notes (Signed)
Subjective:     Patient ID: Kelly Watts, female   DOB: 12-11-1995, 21 y.o.   MRN: [id number]  HPI Kelly Watts is a 21 year old white female in for 2 week F/U of taking megace for bleeding and headaches. The bleeding has stopped but headaches continue.She missed work Friday, due to headache.Has occasional nausea and dizziness but no vision loss or weakness.   Review of Systems +headaches,started about 2 months after nexplanon inserted, see HPI.  Bleeding stopped with megace  Reviewed past medical,surgical, social and family history. Reviewed medications and allergies.     Objective:   Physical Exam BP (!) 84/58 (BP Location: Left Arm, Patient Position: Sitting, Cuff Size: Normal)   Pulse 60   Ht 5\' 3"  (1.6 m)   Wt 98 lb (44.5 kg)   BMI 17.36 kg/m    Talk only, see HPI, will get back in ASAP for nexplanon removal and will discuss birth control options then, she declines pain meds for headaches, will use advil, and hopes nexplanon removal will take care of them.Note given for work.   Assessment:     1. Frequent headaches   2. Nexplanon in place       Plan:     Return 5/10 for nexplanon removal with me

## 2016-09-11 ENCOUNTER — Ambulatory Visit (INDEPENDENT_AMBULATORY_CARE_PROVIDER_SITE_OTHER): Payer: BLUE CROSS/BLUE SHIELD | Admitting: Adult Health

## 2016-09-11 ENCOUNTER — Encounter: Payer: Self-pay | Admitting: Adult Health

## 2016-09-11 VITALS — BP 98/56 | HR 64 | Ht 63.0 in | Wt 98.0 lb

## 2016-09-11 DIAGNOSIS — Z30011 Encounter for initial prescription of contraceptive pills: Secondary | ICD-10-CM

## 2016-09-11 DIAGNOSIS — Z3046 Encounter for surveillance of implantable subdermal contraceptive: Secondary | ICD-10-CM | POA: Diagnosis not present

## 2016-09-11 DIAGNOSIS — Z3202 Encounter for pregnancy test, result negative: Secondary | ICD-10-CM

## 2016-09-11 LAB — POCT URINE PREGNANCY: PREG TEST UR: NEGATIVE

## 2016-09-11 MED ORDER — NORETHIN-ETH ESTRAD-FE BIPHAS 1 MG-10 MCG / 10 MCG PO TABS: 1.0000 | ORAL_TABLET | Freq: Every day | ORAL | 4 refills | Status: DC

## 2016-09-11 NOTE — Patient Instructions (Signed)
Use condoms x 4 weeks, keep clean and dry x 24 hours, no heavy lifting, keep steri strips on x 72 hours, Keep pressure dressing on x 24 hours. Follow up prn problems. Start OC s today See me in 3 months

## 2016-09-11 NOTE — Progress Notes (Signed)
Subjective:     Patient ID: Kelly Watts, female   DOB: [birth year], 21 y.o.   MRN: [id number]  HPI Kelly Watts is a 21 year old white female in for nexplanon removal and to start OCs.  Review of Systems For nexplanon removal Reviewed past medical,surgical, social and family history. Reviewed medications and allergies.     Objective:   Physical Exam BP (!) 98/56 (BP Location: Left Arm, Patient Position: Sitting, Cuff Size: Normal)   Pulse 64   Ht 5\' 3"  (1.6 m)   Wt 98 lb (44.5 kg)   BMI 17.36 kg/m UPT negative.Consent signed, time out called. Left arm cleansed with betadine, and injected with 1.5 cc 2% lidocaine and waited til numb.Under sterile technique a #11 blade was used to make small vertical incision, and a curved forceps was used to easily remove rod. Steri strips applied. Pressure dressing applied.    Assessment:     1. Encounter for Nexplanon removal   2. Encounter for initial prescription of contraceptive pills   3. Pregnancy examination or test, negative result       Plan:     Use condoms x 4 weeks, keep clean and dry x 24 hours, no heavy lifting, keep steri strips on x 72 hours, Keep pressure dressing on x 24 hours. Follow up prn problems.    Meds ordered this encounter  Medications  . Norethindrone-Ethinyl Estradiol-Fe Biphas (LO LOESTRIN FE) 1 MG-10 MCG / 10 MCG tablet    Sig: Take 1 tablet by mouth daily. Take 1 daily by mouth    Dispense:  3 Package    Refill:  4    BIN [id number], PCN [id number], GRP [id number],ID [id number]    Order Specific Question:   Supervising Provider    Answer:   Lazaro ArmsEURE, LUTHER H [[id number]]  Start OCs today Follow up in 3 months  Note given for work, to excuse today, she does heavy lifting.

## 2016-09-30 ENCOUNTER — Encounter (HOSPITAL_COMMUNITY): Payer: Self-pay | Admitting: Emergency Medicine

## 2016-09-30 ENCOUNTER — Ambulatory Visit (HOSPITAL_COMMUNITY)
Admission: EM | Admit: 2016-09-30 | Discharge: 2016-09-30 | Disposition: A | Discharge status: Home / Self Care | Payer: 59 | Attending: Family Medicine | Admitting: Family Medicine

## 2016-09-30 DIAGNOSIS — R110 Nausea: Secondary | ICD-10-CM

## 2016-09-30 DIAGNOSIS — R1012 Left upper quadrant pain: Secondary | ICD-10-CM | POA: Diagnosis not present

## 2016-09-30 DIAGNOSIS — D72829 Elevated white blood cell count, unspecified: Secondary | ICD-10-CM | POA: Insufficient documentation

## 2016-09-30 DIAGNOSIS — Z87891 Personal history of nicotine dependence: Secondary | ICD-10-CM | POA: Diagnosis not present

## 2016-09-30 DIAGNOSIS — Z79899 Other long term (current) drug therapy: Secondary | ICD-10-CM | POA: Diagnosis not present

## 2016-09-30 DIAGNOSIS — R1011 Right upper quadrant pain: Secondary | ICD-10-CM | POA: Diagnosis not present

## 2016-09-30 DIAGNOSIS — Z880 Allergy status to penicillin: Secondary | ICD-10-CM | POA: Diagnosis not present

## 2016-09-30 DIAGNOSIS — Z3202 Encounter for pregnancy test, result negative: Secondary | ICD-10-CM | POA: Diagnosis not present

## 2016-09-30 LAB — CBC WITH DIFFERENTIAL/PLATELET
BASOS ABS: 0 10*3/uL (ref 0.0–0.1)
Basophils Relative: 0 %
Eosinophils Absolute: 0 10*3/uL (ref 0.0–0.7)
Eosinophils Relative: 0 %
HCT: 36.6 % (ref 36.0–46.0)
Hemoglobin: 12.4 g/dL (ref 12.0–15.0)
Lymphocytes Relative: 14 %
Lymphs Abs: 1.6 10*3/uL (ref 0.7–4.0)
MCH: 30.3 pg (ref 26.0–34.0)
MCHC: 33.9 g/dL (ref 30.0–36.0)
MCV: 89.5 fL (ref 78.0–100.0)
MONO ABS: 1.1 10*3/uL — AB (ref 0.1–1.0)
Monocytes Relative: 10 %
NEUTROS ABS: 8.6 10*3/uL — AB (ref 1.7–7.7)
Neutrophils Relative %: 76 %
Platelets: 199 10*3/uL (ref 150–400)
RBC: 4.09 MIL/uL (ref 3.87–5.11)
RDW: 12.1 % (ref 11.5–15.5)
WBC: 11.4 10*3/uL — ABNORMAL HIGH (ref 4.0–10.5)

## 2016-09-30 LAB — POCT URINALYSIS DIP (DEVICE)
Bilirubin Urine: NEGATIVE
GLUCOSE, UA: NEGATIVE mg/dL
Hgb urine dipstick: NEGATIVE
Ketones, ur: 15 mg/dL — AB
LEUKOCYTES UA: NEGATIVE
NITRITE: NEGATIVE
Protein, ur: NEGATIVE mg/dL
UROBILINOGEN UA: 0.2 mg/dL (ref 0.0–1.0)
pH: 6 (ref 5.0–8.0)

## 2016-09-30 LAB — POCT I-STAT, CHEM 8
BUN: 8 mg/dL (ref 6–20)
CALCIUM ION: 1.16 mmol/L (ref 1.15–1.40)
CREATININE: 0.6 mg/dL (ref 0.44–1.00)
Chloride: 103 mmol/L (ref 101–111)
GLUCOSE: 83 mg/dL (ref 65–99)
HCT: 37 % (ref 36.0–46.0)
HEMOGLOBIN: 12.6 g/dL (ref 12.0–15.0)
POTASSIUM: 3.9 mmol/L (ref 3.5–5.1)
Sodium: 139 mmol/L (ref 135–145)
TCO2: 26 mmol/L (ref 0–100)

## 2016-09-30 LAB — POCT H PYLORI SCREEN: H. PYLORI SCREEN, POC: NEGATIVE

## 2016-09-30 LAB — POCT PREGNANCY, URINE: PREG TEST UR: NEGATIVE

## 2016-09-30 MED ORDER — DICYCLOMINE HCL 20 MG PO TABS: 20.0000 mg | ORAL_TABLET | Freq: Two times a day (BID) | ORAL | 0 refills | Status: DC

## 2016-09-30 MED ORDER — OMEPRAZOLE 40 MG PO CPDR: 40.0000 mg | DELAYED_RELEASE_CAPSULE | Freq: Two times a day (BID) | ORAL | 0 refills | Status: DC

## 2016-09-30 NOTE — ED Triage Notes (Signed)
Onset Saturday of left abdominal pain.  No nausea or vomiting since Saturday.  No diarrhea.    Prior to Saturday and having abdominal pain, patient did have vomiting and diarrhea.

## 2016-09-30 NOTE — ED Provider Notes (Signed)
CSN: [id number]     Arrival date & time 09/30/16  1634 History   First MD Initiated Contact with Patient 09/30/16 1830     Chief Complaint  Patient presents with  . Abdominal Pain   (Consider location/radiation/quality/duration/timing/severity/associated sxs/prior Treatment) The history is provided by the patient.  Abdominal Pain  Pain location:  RUQ and epigastric Pain quality: aching, cramping, gnawing and stabbing   Pain radiates to:  Does not radiate Pain severity:  Severe Onset quality:  Gradual Duration:  2 days Timing:  Intermittent Progression:  Waxing and waning Chronicity:  New Context: not alcohol use, not diet changes, not eating, not recent illness, not recent travel, not sick contacts and not suspicious food intake   Relieved by:  Nothing Worsened by:  Eating, movement, palpation and position changes Ineffective treatments:  None tried Associated symptoms: anorexia, diarrhea and nausea   Associated symptoms: no chest pain, no constipation, no dysuria and no vomiting   Risk factors: not pregnant     Past Medical History:  Diagnosis Date  . Encounter for Nexplanon removal 11/16/2015  . Medical history non-contributory   . Nexplanon insertion 11/02/2012   Inserted left arm 11/02/12  . Patient underweight    Past Surgical History:  Procedure Laterality Date  . EAR TUBE REMOVAL    . WISDOM TOOTH EXTRACTION     Family History  Problem Relation Age of Onset  . Coronary artery disease Paternal Grandfather   . Cancer Paternal Grandmother        breast  . Diabetes Maternal Grandmother   . Thyroid disease Maternal Grandmother   . Melanoma Maternal Grandfather    Social History  Substance Use Topics  . Smoking status: Former Smoker    Packs/day: 0.25    Years: 3.00    Types: Cigarettes    Quit date: 01/30/2016  . Smokeless tobacco: Never Used  . Alcohol use No     Comment: occ   OB History    Gravida Para Term Preterm AB Living   1 1   1        SAB TAB  Ectopic Multiple Live Births                 Review of Systems  Constitutional: Negative.   HENT: Negative.   Respiratory: Negative.   Cardiovascular: Negative for chest pain.  Gastrointestinal: Positive for abdominal pain, anorexia, diarrhea and nausea. Negative for constipation and vomiting.  Genitourinary: Negative for dysuria and frequency.  Musculoskeletal: Negative.   Skin: Negative.   Neurological: Negative.     Allergies  Penicillins and Sulfa antibiotics  Home Medications   Prior to Admission medications   Medication Sig Start Date End Date Taking? Authorizing Provider  dicyclomine (BENTYL) 20 MG tablet Take 1 tablet (20 mg total) by mouth 2 (two) times daily. 09/30/16   Dorena Bodo, NP  Norethindrone-Ethinyl Estradiol-Fe Biphas (LO LOESTRIN FE) 1 MG-10 MCG / 10 MCG tablet Take 1 tablet by mouth daily. Take 1 daily by mouth 09/11/16   Adline Potter, NP  omeprazole (PRILOSEC) 40 MG capsule Take 1 capsule (40 mg total) by mouth 2 (two) times daily. 09/30/16 10/14/16  Dorena Bodo, NP  Pediatric Multivitamins-Iron (RA CHILDRENS CHEWABLE VIT/IRON PO) Take by mouth.    [provider]   Meds Ordered and Administered this Visit  Medications - No data to display  BP (!) 110/58 (BP Location: Right Arm) Comment: notified rn  Pulse 69   Temp 98.5 F (36.9 C) (Oral)  Resp 16   SpO2 100%  No data found.   Physical Exam  Constitutional: She is oriented to person, place, and time. She appears well-developed and well-nourished. No distress.  HENT:  Head: Normocephalic and atraumatic.  Right Ear: External ear normal.  Left Ear: External ear normal.  Eyes: Conjunctivae are normal.  Neck: Normal range of motion.  Cardiovascular: Normal rate and regular rhythm.   Pulmonary/Chest: Effort normal and breath sounds normal.  Abdominal: Soft. Normal appearance. Bowel sounds are increased. There is tenderness in the epigastric area and left upper quadrant.  There is no CVA tenderness, no tenderness at McBurney's point and negative Murphy's sign.  Neurological: She is alert and oriented to person, place, and time.  Skin: Skin is warm and dry. Capillary refill takes less than 2 seconds. No rash noted. She is not diaphoretic. No erythema.  Psychiatric: She has a normal mood and affect. Her behavior is normal.  Nursing note and vitals reviewed.   Urgent Care Course     Procedures (including critical care time)  Labs Review Labs Reviewed  CBC WITH DIFFERENTIAL/PLATELET - Abnormal; Notable for the following:       Result Value   WBC 11.4 (*)    Neutro Abs 8.6 (*)    Monocytes Absolute 1.1 (*)    All other components within normal limits  POCT URINALYSIS DIP (DEVICE) - Abnormal; Notable for the following:    Ketones, ur 15 (*)    All other components within normal limits  POCT PREGNANCY, URINE  POCT I-STAT, CHEM 8  POCT H PYLORI SCREEN    Imaging Review No results found.    MDM   1. Left upper quadrant pain    Started on Bentyl, and Prilosec in clinic for gastritis. Later arriving labs show elevation of white blood cells, neutrophils, monocytes. Message forwarded to clinic staff, okay to start on a short course of metronidazole as well. If symptoms persist recommend going to the ER.     Dorena BodoKennard, Cyruss Arata, NP 09/30/16 2149

## 2016-09-30 NOTE — Discharge Instructions (Signed)
You are negative for H. pylori, and so far your other tests are normal. If there were any abnormalities on the remaining pending labs, we will contact you with these results, along with follow-up instructions. Started you on 2 medicines for your symptoms, the first is Bentyl, take one tablet twice a day, the second is a high dose course Prilosec take 1 tablet twice a day as well. If your symptoms worsen in any way, go to the ER. If they persist, follow-up with her primary care provider in one week

## 2016-10-01 ENCOUNTER — Emergency Department (HOSPITAL_COMMUNITY)
Admission: EM | Admit: 2016-10-01 | Discharge: 2016-10-02 | Disposition: A | Discharge status: Home / Self Care | Payer: 59 | Attending: Emergency Medicine | Admitting: Emergency Medicine

## 2016-10-01 ENCOUNTER — Ambulatory Visit (INDEPENDENT_AMBULATORY_CARE_PROVIDER_SITE_OTHER): Payer: 59 | Admitting: Family Medicine

## 2016-10-01 ENCOUNTER — Encounter: Payer: Self-pay | Admitting: Family Medicine

## 2016-10-01 ENCOUNTER — Ambulatory Visit (HOSPITAL_COMMUNITY)
Admission: RE | Admit: 2016-10-01 | Discharge: 2016-10-01 | Disposition: A | Discharge status: Home / Self Care | Payer: 59 | Source: Ambulatory Visit | Attending: Family Medicine | Admitting: Family Medicine

## 2016-10-01 ENCOUNTER — Encounter (HOSPITAL_COMMUNITY): Payer: Self-pay | Admitting: *Deleted

## 2016-10-01 ENCOUNTER — Encounter (HOSPITAL_COMMUNITY): Payer: Self-pay

## 2016-10-01 ENCOUNTER — Ambulatory Visit: Payer: BLUE CROSS/BLUE SHIELD | Admitting: Physician Assistant

## 2016-10-01 VITALS — BP 102/58 | HR 100 | Temp 98.8°F | Resp 16 | Ht 63.0 in | Wt 97.0 lb

## 2016-10-01 DIAGNOSIS — N200 Calculus of kidney: Secondary | ICD-10-CM | POA: Insufficient documentation

## 2016-10-01 DIAGNOSIS — R109 Unspecified abdominal pain: Secondary | ICD-10-CM | POA: Diagnosis not present

## 2016-10-01 DIAGNOSIS — D72829 Elevated white blood cell count, unspecified: Secondary | ICD-10-CM | POA: Diagnosis not present

## 2016-10-01 DIAGNOSIS — R93422 Abnormal radiologic findings on diagnostic imaging of left kidney: Secondary | ICD-10-CM | POA: Insufficient documentation

## 2016-10-01 DIAGNOSIS — E860 Dehydration: Secondary | ICD-10-CM | POA: Diagnosis not present

## 2016-10-01 DIAGNOSIS — Z87891 Personal history of nicotine dependence: Secondary | ICD-10-CM | POA: Diagnosis not present

## 2016-10-01 DIAGNOSIS — R1032 Left lower quadrant pain: Secondary | ICD-10-CM | POA: Insufficient documentation

## 2016-10-01 DIAGNOSIS — R1012 Left upper quadrant pain: Secondary | ICD-10-CM | POA: Insufficient documentation

## 2016-10-01 LAB — CBC
HCT: 39.3 % (ref 35.0–45.0)
Hemoglobin: 14 g/dL (ref 12.0–15.0)
MCH: 31.7 pg (ref 27.0–33.0)
MCHC: 35.6 g/dL (ref 32.0–36.0)
MCV: 88.9 fL (ref 80.0–100.0)
Platelets: 222 10*3/uL (ref 140–400)
RBC: 4.42 MIL/uL (ref 3.80–5.10)
RDW: 12 % (ref 11.0–15.0)
WBC: 14.1 10*3/uL — ABNORMAL HIGH (ref 3.8–10.8)

## 2016-10-01 LAB — COMPREHENSIVE METABOLIC PANEL
ALBUMIN: 3.4 g/dL — AB (ref 3.5–5.0)
ALT: 17 U/L (ref 14–54)
AST: 15 U/L (ref 15–41)
Alkaline Phosphatase: 47 U/L (ref 38–126)
Anion gap: 10 (ref 5–15)
BUN: 7 mg/dL (ref 6–20)
CHLORIDE: 105 mmol/L (ref 101–111)
CO2: 18 mmol/L — ABNORMAL LOW (ref 22–32)
Calcium: 7.7 mg/dL — ABNORMAL LOW (ref 8.9–10.3)
Creatinine, Ser: 0.64 mg/dL (ref 0.44–1.00)
GFR calc Af Amer: >60 mL/min (ref >60)
GLUCOSE: 88 mg/dL (ref 65–99)
POTASSIUM: 3.4 mmol/L — AB (ref 3.5–5.1)
Sodium: 133 mmol/L — ABNORMAL LOW (ref 135–145)
Total Bilirubin: 0.8 mg/dL (ref 0.3–1.2)
Total Protein: 6.3 g/dL — ABNORMAL LOW (ref 6.5–8.1)

## 2016-10-01 LAB — URINALYSIS, ROUTINE W REFLEX MICROSCOPIC
Bilirubin Urine: NEGATIVE
Glucose, UA: NEGATIVE mg/dL
Hgb urine dipstick: NEGATIVE
Ketones, ur: 20 mg/dL — AB
Leukocytes, UA: NEGATIVE
Nitrite: NEGATIVE
PROTEIN: NEGATIVE mg/dL
Specific Gravity, Urine: 1.045 — ABNORMAL HIGH (ref 1.005–1.030)
pH: 5 (ref 5.0–8.0)

## 2016-10-01 LAB — CBC WITH DIFFERENTIAL/PLATELET
BASOS ABS: 0 10*3/uL (ref 0.0–0.1)
Basophils Relative: 0 %
Eosinophils Absolute: 0 10*3/uL (ref 0.0–0.7)
Eosinophils Relative: 0 %
HCT: 33.2 % — ABNORMAL LOW (ref 36.0–46.0)
Hemoglobin: 11.7 g/dL — ABNORMAL LOW (ref 12.0–15.0)
LYMPHS PCT: 11 %
Lymphs Abs: 1.5 10*3/uL (ref 0.7–4.0)
MCH: 31.2 pg (ref 26.0–34.0)
MCHC: 35.2 g/dL (ref 30.0–36.0)
MCV: 88.5 fL (ref 78.0–100.0)
MONO ABS: 1.6 10*3/uL — AB (ref 0.1–1.0)
Monocytes Relative: 12 %
NEUTROS ABS: 10.1 10*3/uL — AB (ref 1.7–7.7)
NEUTROS PCT: 77 %
Platelets: 192 10*3/uL (ref 150–400)
RBC: 3.75 MIL/uL — ABNORMAL LOW (ref 3.87–5.11)
RDW: 12 % (ref 11.5–15.5)
WBC: 13.2 10*3/uL — ABNORMAL HIGH (ref 4.0–10.5)

## 2016-10-01 LAB — WET PREP FOR TRICH, YEAST, CLUE
Trich, Wet Prep: NONE SEEN
WBC WET PREP: NONE SEEN
Yeast Wet Prep HPF POC: NONE SEEN

## 2016-10-01 LAB — PREGNANCY, URINE: PREG TEST UR: NEGATIVE

## 2016-10-01 LAB — LIPASE, BLOOD: LIPASE: 19 U/L (ref 11–51)

## 2016-10-01 MED ORDER — DEXTROSE 5 % IV SOLN
250.0000 mg | Freq: Once | INTRAVENOUS | Status: AC
  Administered 2016-10-01: 250 mg via INTRAVENOUS

## 2016-10-01 MED ORDER — ONDANSETRON HCL 4 MG PO TABS: 4.0000 mg | ORAL_TABLET | Freq: Three times a day (TID) | ORAL | 0 refills | Status: DC | PRN

## 2016-10-01 MED ORDER — NAPROXEN 250 MG PO TABS: 250.0000 mg | ORAL_TABLET | Freq: Two times a day (BID) | ORAL | 0 refills | Status: DC | PRN

## 2016-10-01 MED ORDER — IOPAMIDOL (ISOVUE-300) INJECTION 61%
INTRAVENOUS | Status: AC
  Filled 2016-10-01: qty 30

## 2016-10-01 MED ORDER — DOXYCYCLINE HYCLATE 100 MG PO TABS: 100.0000 mg | ORAL_TABLET | Freq: Two times a day (BID) | ORAL | 0 refills | Status: DC

## 2016-10-01 MED ORDER — IOPAMIDOL (ISOVUE-300) INJECTION 61%
100.0000 mL | Freq: Once | INTRAVENOUS | Status: AC | PRN
  Administered 2016-10-01: 100 mL via INTRAVENOUS

## 2016-10-01 MED ORDER — DOXYCYCLINE HYCLATE 100 MG PO TABS
100.0000 mg | ORAL_TABLET | Freq: Once | ORAL | Status: AC
  Administered 2016-10-01: 100 mg via ORAL
  Filled 2016-10-01: qty 1

## 2016-10-01 MED ORDER — ONDANSETRON 4 MG PO TBDP: 4.0000 mg | ORAL_TABLET | Freq: Three times a day (TID) | ORAL | 0 refills | Status: DC | PRN

## 2016-10-01 MED ORDER — KETOROLAC TROMETHAMINE 30 MG/ML IJ SOLN
30.0000 mg | Freq: Once | INTRAMUSCULAR | Status: AC
  Administered 2016-10-01: 30 mg via INTRAVENOUS
  Filled 2016-10-01: qty 1

## 2016-10-01 MED ORDER — STERILE WATER FOR INJECTION IJ SOLN
INTRAMUSCULAR | Status: AC
  Filled 2016-10-01: qty 10

## 2016-10-01 MED ORDER — CEFTRIAXONE SODIUM 1 G IJ SOLR
750.0000 mg | Freq: Once | INTRAMUSCULAR | Status: AC
  Administered 2016-10-01: 750 mg via INTRAMUSCULAR
  Filled 2016-10-01: qty 10

## 2016-10-01 NOTE — ED Triage Notes (Signed)
Pt reports llq pain and nausea since Saturday. Pt was sent here to have a CT scan today and was brought from CT for the pt to check in. Radiology is concerned that the pt has pyelonephritis

## 2016-10-01 NOTE — Progress Notes (Signed)
Subjective:    Patient ID: Kelly Watts, female    DOB: 26-May-1995, 21 y.o.   MRN: [id number]  Patient presents for L sided Abd Pain (x1 week- states that she was sic last week with N/V, but now has L sided abd pain- denies changes to bowels- loss of appetite)   Patient with left-sided abdominal pain. Last Tuesday she began to have pain along with nausea vomiting diarrhea the diarrhea has resolved however on Saturday she began having severe left-sided pain. She's also had decreased appetite and still has nausea but has not had any vomiting the past few days. She is unsure if she's had a fever she's been taking ibuprofen. No known sick contacts. She was seen in the urgent care yesterday Chem 8 was done which was unremarkable. She also had CBC which showed an elevated white blood cell count at 11.4 otherwise normal. She had H pylori screen done which was negative she had urine pregnancy which was negative she had urinalysis which shows some ketones associated with her dehydration. She was given omeprazole from the urgent care as well as Bentyl these have not helped. She denies any dysuria denies any gross hematuria in the urine. Denies any vaginal discharge or vaginal pain denies any abnormal vaginal bleeding. No new sexual partners     Review Of Systems:  GEN- denies fatigue, fever, weight loss,weakness, recent illness HEENT- denies eye drainage, change in vision, nasal discharge, CVS- denies chest pain, palpitations RESP- denies SOB, cough, wheeze ABD-+ N/V, change in stools, +abd pain GU- denies dysuria, hematuria, dribbling, incontinence MSK- denies joint pain, muscle aches, injury Neuro- denies headache, dizziness, syncope, seizure activity       Objective:    BP (!) 102/58   Pulse 100   Temp 98.8 F (37.1 C) (Oral)   Resp 16   Ht 5\' 3"  (1.6 m)   Wt 97 lb (44 kg)   LMP 09/12/2016 (Approximate) Comment: irregular  SpO2 97%   BMI 17.18 kg/m  GEN- NAD, alert and oriented  x3 HEENT- PERRL, EOMI, non injected sclera, pink conjunctiva, Dry MM, oropharynx clear Neck- Supple, no LAD  CVS- RRR, no murmur RESP-CTAB ABD-NABS,soft,TTP LLQ and suprapubic region, TTP Left flank, ND GU- normal external genitalia, vaginal mucosa pink and moist, cervix visualized no growth, no blood form os+ discharge, + mild CMT, no ovarian masses, TTP left ovarian region, uterus normal size EXT- No edema Pulses- Radial, DP- 2+   WBC 11.4>14.1 today Neutrophils 79%  Hb 14/HCT 39.3      Assessment & Plan:      Problem List Items Addressed This Visit    None    Visit Diagnoses    Left sided abdominal pain    -  Primary   Concern for possible PID, other ovarian pathology, enteritis, based on left sided pain which is progressing and leukocytosis. Given ROCEPHIN 250MG  IN OFFICE, CT scan to be done If CT unremarkable or signs of PID start doxycycline 100mg  BID No sign of UTI U preg negative  Zofran for nausea Note for work remainder of week due to illness     Relevant Orders   CBC (Completed)   WET PREP FOR TRICH, YEAST, CLUE (Completed)   GC/Chlamydia Probe Amp   CT Abdomen Pelvis W Contrast   Dehydration       Give 2L of fluid in office    Leukocytosis, unspecified type       Relevant Orders   CT Abdomen Pelvis W Contrast  Note: This dictation was prepared with Dragon dictation along with smaller phrase technology. Any transcriptional errors that result from this process are unintentional.

## 2016-10-01 NOTE — ED Notes (Signed)
Pt states she had the stomach "bug" last week and now is having L flank pain for 4 days. Denies dysuria, hematuria, fever, N/V/D/ at this time.

## 2016-10-01 NOTE — Patient Instructions (Addendum)
Get the CT scan done  Zofran for nausea

## 2016-10-01 NOTE — ED Provider Notes (Signed)
[facility]Y DEPT Provider Note   CSN: [id number] Arrival date & time: 10/01/16  2057     History   Chief Complaint Chief Complaint  Patient presents with  . Abdominal Pain    HPI Kelly Watts is a 21 y.o. female.   Abdominal Pain    Pt was seen at 2130.   Per pt, c/o gradual onset and worsening of constant left sided abd and flank "pain" for the past 4 days. Has been associated with nausea and decreased appetite. Describes the pain as "aching," "cramping." Pt states 1 week ago she had vomiting/diarrhea, since resolved. Pt has been evaluated by [facility] and her PMD (today PTA) for these symptoms. Pt's PMD sent her to the ED for further evaluation/admission for possible pyelonephritis and PID.  Denies any further N/V, no diarrhea, no fevers, no dysuria/hemturia, no vaginal bleeding/discharge, no rash, no CP/SOB, no black or blood in stools or emesis.      Past Medical History:  Diagnosis Date  . Encounter for Nexplanon removal 11/16/2015  . Medical history non-contributory   . Nexplanon insertion 11/02/2012   Inserted left arm 11/02/12  . Patient underweight     Patient Active Problem List   Diagnosis Date Noted  . Tired 08/25/2016  . Irregular bleeding 08/25/2016  . Menorrhagia with irregular cycle 08/25/2016  . Nexplanon in place 11/16/2015  . Flank pain 09/13/2013  . Nexplanon insertion 11/02/2012  . Rh negative status during pregnancy 07/21/2012    Past Surgical History:  Procedure Laterality Date  . EAR TUBE REMOVAL    . WISDOM TOOTH EXTRACTION      OB History    Gravida Para Term Preterm AB Living   1 1   1        SAB TAB Ectopic Multiple Live Births                   Home Medications    Prior to Admission medications   Medication Sig Start Date End Date Taking? Authorizing Provider  dicyclomine (BENTYL) 20 MG tablet Take 1 tablet (20 mg total) by mouth 2 (two) times daily. 09/30/16   Dorena Bodo, NP  Norethindrone-Ethinyl Estradiol-Fe Biphas (LO  LOESTRIN FE) 1 MG-10 MCG / 10 MCG tablet Take 1 tablet by mouth daily. Take 1 daily by mouth 09/11/16   Adline Potter, NP  omeprazole (PRILOSEC) 40 MG capsule Take 1 capsule (40 mg total) by mouth 2 (two) times daily. 09/30/16 10/14/16  Dorena Bodo, NP  ondansetron (ZOFRAN) 4 MG tablet Take 1 tablet (4 mg total) by mouth every 8 (eight) hours as needed for nausea or vomiting. 10/01/16   Salley Scarlet, MD  Pediatric Multivitamins-Iron (RA CHILDRENS CHEWABLE VIT/IRON PO) Take by mouth.    [provider]    Family History Family History  Problem Relation Age of Onset  . Coronary artery disease Paternal Grandfather   . Cancer Paternal Grandmother        breast  . Diabetes Maternal Grandmother   . Thyroid disease Maternal Grandmother   . Melanoma Maternal Grandfather     Social History Social History  Substance Use Topics  . Smoking status: Former Smoker    Packs/day: 0.25    Years: 3.00    Types: Cigarettes    Quit date: 01/30/2016  . Smokeless tobacco: Never Used  . Alcohol use No     Comment: occ     Allergies   Penicillins and Sulfa antibiotics   Review of Systems Review  of Systems  Gastrointestinal: Positive for abdominal pain.  ROS: Statement: All systems negative except as marked or noted in the HPI; Constitutional: Negative for fever and chills. ; ; Eyes: Negative for eye pain, redness and discharge. ; ; ENMT: Negative for ear pain, hoarseness, nasal congestion, sinus pressure and sore throat. ; ; Cardiovascular: Negative for chest pain, palpitations, diaphoresis, dyspnea and peripheral edema. ; ; Respiratory: Negative for cough, wheezing and stridor. ; ; Gastrointestinal: +abd pain. Negative for nausea, vomiting, diarrhea, blood in stool, hematemesis, jaundice and rectal bleeding. . ; ; Genitourinary: +flank pain. Negative for dysuria and hematuria. ; ; GYN:  No pelvic pain, no vaginal bleeding, no vaginal discharge, no vulvar pain. ;; Musculoskeletal:  Negative for back pain and neck pain. Negative for swelling and trauma.; ; Skin: Negative for pruritus, rash, abrasions, blisters, bruising and skin lesion.; ; Neuro: Negative for headache, lightheadedness and neck stiffness. Negative for weakness, altered level of consciousness, altered mental status, extremity weakness, paresthesias, involuntary movement, seizure and syncope.       Physical Exam Updated Vital Signs BP (!) 106/55   Pulse 80   Temp 98.5 F (36.9 C)   Resp 18   LMP 09/12/2016 (Approximate) Comment: irregular  SpO2 98%   Physical Exam 2135: Physical examination:  Nursing notes reviewed; Vital signs and O2 SAT reviewed;  Constitutional: Well developed, Well nourished, Well hydrated, In no acute distress; Head:  Normocephalic, atraumatic; Eyes: EOMI, PERRL, No scleral icterus; ENMT: Mouth and pharynx normal, Mucous membranes moist; Neck: Supple, Full range of motion, No lymphadenopathy; Cardiovascular: Regular rate and rhythm, No gallop; Respiratory: Breath sounds clear & equal bilaterally, No wheezes.  Speaking full sentences with ease, Normal respiratory effort/excursion; Chest: Nontender, Movement normal; Abdomen: Soft, +LUQ and LLQ tenderness to palp. No rebound or guarding. Nondistended, Normal bowel sounds; Genitourinary: +mild left CVA tenderness; Extremities: Pulses normal, No tenderness, No edema, No calf edema or asymmetry.; Neuro: AA&Ox3, Major CN grossly intact.  Speech clear. No gross focal motor or sensory deficits in extremities. Climbs on and off stretcher easily by herself. Gait steady.; Skin: Color normal, Warm, Dry.   ED Treatments / Results  Labs (all labs ordered are listed, but only abnormal results are displayed)   EKG  EKG Interpretation None       Radiology   Procedures Procedures (including critical care time)  Medications Ordered in ED Medications  ketorolac (TORADOL) 30 MG/ML injection 30 mg (not administered)     Initial Impression  / Assessment and Plan / ED Course  I have reviewed the triage vital signs and the nursing notes.  Pertinent labs & imaging results that were available during my care of the patient were reviewed by me and considered in my medical decision making (see chart for details).  MDM Reviewed: previous chart, nursing note and vitals Reviewed previous: labs and CT scan Interpretation: labs   Results for orders placed or performed during the hospital encounter of 10/01/16  Urinalysis, Routine w reflex microscopic  Result Value Ref Range   Color, Urine STRAW (A) YELLOW   APPearance CLEAR CLEAR   Specific Gravity, Urine 1.045 (H) 1.005 - 1.030   pH 5.0 5.0 - 8.0   Glucose, UA NEGATIVE NEGATIVE mg/dL   Hgb urine dipstick NEGATIVE NEGATIVE   Bilirubin Urine NEGATIVE NEGATIVE   Ketones, ur 20 (A) NEGATIVE mg/dL   Protein, ur NEGATIVE NEGATIVE mg/dL   Nitrite NEGATIVE NEGATIVE   Leukocytes, UA NEGATIVE NEGATIVE  Pregnancy, urine  Result Value Ref  Range   Preg Test, Ur NEGATIVE NEGATIVE  Comprehensive metabolic panel  Result Value Ref Range   Sodium 133 (L) 135 - 145 mmol/L   Potassium 3.4 (L) 3.5 - 5.1 mmol/L   Chloride 105 101 - 111 mmol/L   CO2 18 (L) 22 - 32 mmol/L   Glucose, Bld 88 65 - 99 mg/dL   BUN 7 6 - 20 mg/dL   Creatinine, Ser 1.610.64 0.44 - 1.00 mg/dL   Calcium 7.7 (L) 8.9 - 10.3 mg/dL   Total Protein 6.3 (L) 6.5 - 8.1 g/dL   Albumin 3.4 (L) 3.5 - 5.0 g/dL   AST 15 15 - 41 U/L   ALT 17 14 - 54 U/L   Alkaline Phosphatase 47 38 - 126 U/L   Total Bilirubin 0.8 0.3 - 1.2 mg/dL   GFR calc non Af Amer >60 >60 mL/min   GFR calc Af Amer >60 >60 mL/min   Anion gap 10 5 - 15  Lipase, blood  Result Value Ref Range   Lipase 19 11 - 51 U/L  CBC with Differential  Result Value Ref Range   WBC 13.2 (H) 4.0 - 10.5 K/uL   RBC 3.75 (L) 3.87 - 5.11 MIL/uL   Hemoglobin 11.7 (L) 12.0 - 15.0 g/dL   HCT 09.633.2 (L) 04.536.0 - 40.946.0 %   MCV 88.5 78.0 - 100.0 fL   MCH 31.2 26.0 - 34.0 pg   MCHC 35.2  30.0 - 36.0 g/dL   RDW 81.112.0 91.411.5 - 78.215.5 %   Platelets 192 150 - 400 K/uL   Neutrophils Relative % 77 %   Neutro Abs 10.1 (H) 1.7 - 7.7 K/uL   Lymphocytes Relative 11 %   Lymphs Abs 1.5 0.7 - 4.0 K/uL   Monocytes Relative 12 %   Monocytes Absolute 1.6 (H) 0.1 - 1.0 K/uL   Eosinophils Relative 0 %   Eosinophils Absolute 0.0 0.0 - 0.7 K/uL   Basophils Relative 0 %   Basophils Absolute 0.0 0.0 - 0.1 K/uL    Ct Abdomen Pelvis W Contrast Result Date: 10/01/2016 CLINICAL DATA:  21 year old with left lower quadrant abdominal pain for 4 days. Elevated white blood cell count. EXAM: CT ABDOMEN AND PELVIS WITH CONTRAST TECHNIQUE: Multidetector CT imaging of the abdomen and pelvis was performed using the standard protocol following bolus administration of intravenous contrast. CONTRAST:  100mL ISOVUE-300 IOPAMIDOL (ISOVUE-300) INJECTION 61% COMPARISON:  None. FINDINGS: Lower chest: Lung bases are clear. Hepatobiliary: Normal appearance of the liver, gallbladder and portal venous system. Pancreas: Normal appearance of the pancreas without inflammation or duct dilatation. Spleen: Normal appearance of spleen without enlargement. Adrenals/Urinary Tract: Normal adrenal glands. Right kidney is normal without hydronephrosis. Mild distention of the urinary bladder. There is cortical scarring in the left kidney lower pole with small stones in the left kidney lower pole. The largest left kidney Maslow measures 7 mm. Heterogeneous enhancement the left kidney upper pole and the left kidney interpolar region. This is best seen on the coronal reformats, sequence 5 image 43. Findings are concerning for left pyelonephritis. Negative for left hydronephrosis. Stomach/Bowel: Appendix contains some oral contrast in the proximal aspect. The tip of the appendix extends into the pelvis and surrounded by pelvic fluid. No evidence for acute appendix inflammation. No evidence for acute bowel inflammation or obstruction. Vascular/Lymphatic:  No significant vascular findings are present. No enlarged abdominal or pelvic lymph nodes. Reproductive: Small to moderate amount of pelvic free fluid. Prominent veins in the pelvis. Low-density structures involving  the adnexa regions probably represent follicles but difficult to exclude hydrosalpinx. Other: Small amount amount of pelvic fluid around the uterus and adnexal structures. Negative for free air. Musculoskeletal: No acute bone abnormality. IMPRESSION: Scarring in the left kidney with left renal calculi. In addition, there is concern for heterogeneity in the left kidney upper pole and left kidney interpolar region. This could be related to scarring but cannot exclude left pyelonephritis. No evidence for an obstructing Crosson. Small to moderate amount of fluid in the pelvis with low-density structures involving the adnexa. Findings are nonspecific. Difficult to exclude hydrosalpinx. Recommend further characterization with a pelvic ultrasound, particularly if there is concern for pelvic inflammatory disease. These results will be called to the ordering clinician or representative by the Radiologist Assistant, and communication documented in the PACS or zVision Dashboard. Electronically Signed   By: Richarda Overlie M.D.   On: 10/01/2016 20:29    2220:   VS per baseline. Will defer pelvic exam at this time, as pt has had pelvic exam at PMD's ofc PTA: GC/chlam pending, few clue cells on wet prep (no WBC's) and was given IM rocephin.  Has tol PO well while in the ED without N/V.  No clear UTI on Udip; UC pending. No clear indication for admission at this time. T/C to OB/GYN Dr. Despina Hidden, case discussed, including:  HPI, pertinent PM/SHx, VS/PE, dx testing, ED course and treatment:  He has viewed the CT images, states doubts hydrosalpinx at this time, most likely follicles; difficult to call pyelonephritis without clear UTI on Udip; also agrees no clear indication for admission at this time, requests to have d/w pt and  mother regarding use of abx without clear infection (ie: UTI) and that BV likely incidental finding at this time vs cause for pain), states dose for total of IM rocephin 1gram, doxycycline 100mg  PO BID x14 days, OB/GYN or PMD can f/u UC in next few days, pt can f/u OB/GYN office within the next week.  Dx and testing, as well as d/w OB/GYN MD and long discussion re: abx usage without clear infection, d/w pt and family.  Questions answered.  Verb understanding, agreeable with plan above and to d/c home with outpt f/u.    Final Clinical Impressions(s) / ED Diagnoses   Final diagnoses:  None    New Prescriptions New Prescriptions   No medications on file      Samuel Jester, DO 10/03/16 2347

## 2016-10-01 NOTE — Discharge Instructions (Signed)
Take the prescriptions as directed.  Apply moist heat or ice to the area(s) of discomfort, for 15 minutes at a time, several times per day for the next few days.  Do not fall asleep on a heating or ice pack.  Call your regular medical doctor tomorrow to schedule a follow up appointment in the next 2 days. Call your OB/GYN doctor tomorrow to schedule a follow up appointment within the next week.  Return to the Emergency Department immediately if worsening.

## 2016-10-02 ENCOUNTER — Telehealth: Payer: Self-pay | Admitting: Obstetrics and Gynecology

## 2016-10-02 ENCOUNTER — Other Ambulatory Visit: Payer: Self-pay | Admitting: Family Medicine

## 2016-10-02 DIAGNOSIS — R188 Other ascites: Secondary | ICD-10-CM

## 2016-10-02 DIAGNOSIS — N730 Acute parametritis and pelvic cellulitis: Secondary | ICD-10-CM

## 2016-10-02 LAB — GC/CHLAMYDIA PROBE AMP
CT Probe RNA: NOT DETECTED
GC PROBE AMP APTIMA: NOT DETECTED

## 2016-10-02 NOTE — Progress Notes (Signed)
GYN referral possible PID and fluid around uterus ovary  Noted pt on Doxy from ER Given fluids, pain control

## 2016-10-02 NOTE — Telephone Encounter (Signed)
Spoke to patient who states she already has appointment scheduled. No other questions.

## 2016-10-03 ENCOUNTER — Other Ambulatory Visit: Payer: Self-pay | Admitting: Family Medicine

## 2016-10-03 LAB — URINE CULTURE: CULTURE: NO GROWTH

## 2016-10-03 MED ORDER — TRAMADOL HCL 50 MG PO TABS: 50.0000 mg | ORAL_TABLET | Freq: Three times a day (TID) | ORAL | 0 refills | Status: DC | PRN

## 2016-10-03 NOTE — Progress Notes (Signed)
Called in Ultram, see result note

## 2016-10-06 ENCOUNTER — Ambulatory Visit (INDEPENDENT_AMBULATORY_CARE_PROVIDER_SITE_OTHER): Payer: 59 | Admitting: Obstetrics and Gynecology

## 2016-10-06 ENCOUNTER — Encounter: Payer: Self-pay | Admitting: Obstetrics and Gynecology

## 2016-10-06 VITALS — BP 90/60 | HR 68 | Wt 101.2 lb

## 2016-10-06 DIAGNOSIS — R1032 Left lower quadrant pain: Secondary | ICD-10-CM

## 2016-10-06 NOTE — Progress Notes (Addendum)
[facility] Visit  10/06/2016           Patient name: Kelly Watts MRN [id number]  Date of birth: 01/20/1996  CC & HPI:  Kelly Watts is a 21 y.o. female presenting today for left abdominal/left flank pain which began ~1 week ago. She notes the pain has resolved at this time. Pt was seen in the ED on 10/02/3016 for the same abdominal pain and had been evaluated prior to that by her PCP for the same. Her PCP had concerns for PID and fluid around the left ovary. Her abdominal CT on 10/02/2016  showed a left kidney Browning and she was discharged with 14 days course of doxycycline. Pt lives with her boyfriend who is her only sexual partner; she does not have concerns for STDs.   ROS:  ROS Otherwise negative for acute change except as noted in the HPI.  Pertinent History Reviewed:   Reviewed:  Medical         Past Medical History:  Diagnosis Date  . Encounter for Nexplanon removal 11/16/2015  . Medical history non-contributory   . Nexplanon insertion 11/02/2012   Inserted left arm 11/02/12  . Patient underweight                               Surgical Hx:    Past Surgical History:  Procedure Laterality Date  . EAR TUBE REMOVAL    . WISDOM TOOTH EXTRACTION     Medications: Reviewed & Updated - see associated section                       Current Outpatient Prescriptions:  .  dicyclomine (BENTYL) 20 MG tablet, Take 1 tablet (20 mg total) by mouth 2 (two) times daily., Disp: 20 tablet, Rfl: 0 .  doxycycline (VIBRA-TABS) 100 MG tablet, Take 1 tablet (100 mg total) by mouth 2 (two) times daily., Disp: 28 tablet, Rfl: 0 .  naproxen (NAPROSYN) 250 MG tablet, Take 1 tablet (250 mg total) by mouth 2 (two) times daily as needed for mild pain or moderate pain (take with food)., Disp: 14 tablet, Rfl: 0 .  Norethindrone-Ethinyl Estradiol-Fe Biphas (LO LOESTRIN FE) 1 MG-10 MCG / 10 MCG tablet, Take 1 tablet by mouth daily. Take 1 daily by mouth, Disp: 3 Package, Rfl: 4 .  ondansetron (ZOFRAN  ODT) 4 MG disintegrating tablet, Take 1 tablet (4 mg total) by mouth every 8 (eight) hours as needed for nausea or vomiting., Disp: 6 tablet, Rfl: 0 .  Pediatric Multivitamins-Iron (RA CHILDRENS CHEWABLE VIT/IRON PO), Take by mouth., Disp: , Rfl:  .  traMADol (ULTRAM) 50 MG tablet, Take 1 tablet (50 mg total) by mouth every 8 (eight) hours as needed., Disp: 30 tablet, Rfl: 0 .  omeprazole (PRILOSEC) 40 MG capsule, Take 1 capsule (40 mg total) by mouth 2 (two) times daily. (Patient not taking: Reported on 10/06/2016), Disp: 28 capsule, Rfl: 0 .  ondansetron (ZOFRAN) 4 MG tablet, Take 1 tablet (4 mg total) by mouth every 8 (eight) hours as needed for nausea or vomiting. (Patient not taking: Reported on 10/06/2016), Disp: 20 tablet, Rfl: 0   Social History: Reviewed -  reports that she quit smoking about 8 months ago. Her smoking use included Cigarettes. She has a 0.75 pack-year smoking history. She has never used smokeless tobacco.  Objective Findings:  Vitals: Blood pressure 90/60, pulse 68, weight  101 lb 3.2 oz (45.9 kg), last menstrual period 09/12/2016.  Physical Examination: General appearance - alert, well appearing, and in no distress Mental status - alert, oriented to person, place, and time Abdomen - soft, nontender, nondistended, no masses or organomegaly Pelvic -  VULVA: normal appearing vulva with no masses, tenderness or lesions, VAGINA: normal appearing vagina with normal color and discharge, no lesions,  CERVIX: normal appearing cervix without discharge or lesions,  UTERUS: uterus is tiny, normal shape, consistency and nontender,  ADNEXA: normal adnexa in size, nontender and no masses   Assessment & Plan:   A:  1. Normal pelvic exam 2. H/o left renal calculus , possible renal source to pain episode, now resolved.  P:  1. Follow up PRN    By signing my name below, I, Freida Busman, attest that this documentation has been prepared under the direction and in the presence of  Tilda Burrow, MD . Electronically Signed: Freida Busman, Scribe. 10/06/2016. 2:20 PM. I personally performed the services described in this documentation, which was SCRIBED in my presence. The recorded information has been reviewed and considered accurate. It has been edited as necessary during review. Tilda Burrow, MD

## 2016-10-13 ENCOUNTER — Encounter: Payer: Self-pay | Admitting: Family Medicine

## 2016-10-13 ENCOUNTER — Ambulatory Visit (INDEPENDENT_AMBULATORY_CARE_PROVIDER_SITE_OTHER): Payer: 59 | Admitting: Family Medicine

## 2016-10-13 VITALS — BP 92/68 | HR 82 | Temp 98.3°F | Resp 14 | Ht 63.0 in | Wt 100.0 lb

## 2016-10-13 DIAGNOSIS — N281 Cyst of kidney, acquired: Secondary | ICD-10-CM

## 2016-10-13 DIAGNOSIS — N200 Calculus of kidney: Secondary | ICD-10-CM | POA: Diagnosis not present

## 2016-10-13 DIAGNOSIS — R109 Unspecified abdominal pain: Secondary | ICD-10-CM | POA: Diagnosis not present

## 2016-10-13 LAB — URINALYSIS, ROUTINE W REFLEX MICROSCOPIC
Bilirubin Urine: NEGATIVE
GLUCOSE, UA: NEGATIVE
Hgb urine dipstick: NEGATIVE
Ketones, ur: NEGATIVE
LEUKOCYTES UA: NEGATIVE
Nitrite: NEGATIVE
Protein, ur: NEGATIVE
SPECIFIC GRAVITY, URINE: 1.02 (ref 1.001–1.035)
pH: 7 (ref 5.0–8.0)

## 2016-10-13 MED ORDER — TAMSULOSIN HCL 0.4 MG PO CAPS: 0.4000 mg | ORAL_CAPSULE | Freq: Every day | ORAL | 0 refills | Status: DC

## 2016-10-13 NOTE — Patient Instructions (Addendum)
Miralax for constipation  Urology referral  Take the flomax   F/U pending referral

## 2016-10-13 NOTE — Assessment & Plan Note (Addendum)
Concern for kidney stones, though no hematuria noted. No infection in culture, so not pyelonephritis, GYN does not feel this was true PID. STD culture neg. Start flomax for 2 weeks Get appt with urology  Note the lesion that looked like scarring vs lesion, vs pyelo, she [facility] were parenchymal calcification versus angiomyolipoma Continue ultram PRN

## 2016-10-13 NOTE — Progress Notes (Signed)
Subjective:    Patient ID: Kelly Watts, female    DOB: January 12, 1996, 21 y.o.   MRN: [id number]  Patient presents for L Sided Abd Pain (intermittent sharp throbbing pain) Eash in here with ongoing left sided abdominal pain. She had been seen at the urgent care 5/29 after having nausea and vomiting for a few days given omeprazole and zofran. Her nausea vomiting diarrhea. She still has severe left-sided pain. She initially had some dysuria but then that resolved by the time I saw her. On 5/30 in our offices she was severely dehydrated she was given IV fluids she's still having severe pain in the left side of the pelvic exam she had some cervical motion tenderness there is concern for possible PID other possibilities pyelonephritis although her use urine sample was normal. She had an urgent CT scan done which showed fluid around the left ovarian region questionable PID her cultures were pending at that time. There is also signs of possible pyelonephritis as well as kidney stones, though there was no hydronephrosis and there was no Huish noted in the ureters. She was seen by GYN in the office ultrasound was not done was thought with pelvic fluid was a normal variant and there was no sign of any PID. Her cultures did come back negative with exception of some mild clue cells. She has completed the doxycycline. Of note she was also given 1 g of Rocephin total on 5/30. When I spoke with her on June 1 she states the ibuprofen was not helping with pain therefore she was prescribed tramadol which she has been taking.  CT abdomen Impression 5/30 Scarring in the left kidney with left renal calculi. In addition, there is concern for heterogeneity in the left kidney upper pole and left kidney interpolar region. This could be related to scarring but cannot exclude left pyelonephritis. No evidence for an obstructing Rewis.  Small to moderate amount of fluid in the pelvis with low-density structures involving  the adnexa. Findings are nonspecific. Difficult to exclude hydrosalpinx. Recommend further characterization with a pelvic ultrasound, particularly if there is concern for pelvic inflammatory disease.  Today she states pain improves some days, and others it is the same on left side radiating down to lower abdomen. When she had her GYN pelvic exam she did not have any pain over the ovary region at that time. No further N/V, no fever, no hematuria, mild constipation with pain medication    Review Of Systems:  GEN- denies fatigue, fever, weight loss,weakness, recent illness HEENT- denies eye drainage, change in vision, nasal discharge, CVS- denies chest pain, palpitations RESP- denies SOB, cough, wheeze ABD- denies N/V, change in stools, +abd pain GU- denies dysuria, hematuria, dribbling, incontinence MSK- denies joint pain, muscle aches, injury Neuro- denies headache, dizziness, syncope, seizure activity       Objective:    BP 92/68   Pulse 82   Temp 98.3 F (36.8 C) (Oral)   Resp 14   Ht 5\' 3"  (1.6 m)   Wt 100 lb (45.4 kg)   SpO2 96%   BMI 17.71 kg/m  GEN- NAD, alert and oriented x3 HEENT- MMM, oropharynx clear  CVS- RRR, no murmur RESP-CTAB ABD-NABS,soft,mild TTP LUQ/LLQ, Left flank  EXT- No edema Pulses- Radial 2+        Assessment & Plan:      Problem List Items Addressed This Visit    Flank pain - Primary   Relevant Orders   Urinalysis, Routine w reflex microscopic (  Completed)   Kidney stones    Concern for kidney stones, though no hematuria noted. No infection in culture, so not pyelonephritis, GYN does not feel this was true PID. STD culture neg. Start flomax for 2 weeks Get appt with urology  Note the lesion that looked like scarring vs lesion, vs pyelo, she had [place] years ago with [facility] DD were parenchymal calcification versus angiomyolipoma Continue ultram PRN      Relevant Orders   Urinalysis, Routine w reflex microscopic (Completed)   Ambulatory  referral to Urology   Kidney cyst, acquired   Relevant Orders   Ambulatory referral to Urology      Note: This dictation was prepared with Dragon dictation along with smaller phrase technology. Any transcriptional errors that result from this process are unintentional.

## 2016-10-14 DIAGNOSIS — N200 Calculus of kidney: Secondary | ICD-10-CM | POA: Diagnosis not present

## 2016-10-15 ENCOUNTER — Other Ambulatory Visit: Payer: Self-pay | Admitting: Urology

## 2016-10-24 ENCOUNTER — Encounter: Payer: Self-pay | Admitting: Family Medicine

## 2016-10-27 ENCOUNTER — Encounter (HOSPITAL_COMMUNITY): Payer: Self-pay | Admitting: *Deleted

## 2016-10-30 ENCOUNTER — Ambulatory Visit (HOSPITAL_COMMUNITY): Payer: 59

## 2016-10-30 ENCOUNTER — Ambulatory Visit (HOSPITAL_COMMUNITY)
Admission: RE | Admit: 2016-10-30 | Discharge: 2016-10-30 | Disposition: A | Discharge status: Home / Self Care | Payer: 59 | Source: Ambulatory Visit | Attending: Urology | Admitting: Urology

## 2016-10-30 ENCOUNTER — Encounter (HOSPITAL_COMMUNITY): Payer: Self-pay | Admitting: *Deleted

## 2016-10-30 ENCOUNTER — Encounter (HOSPITAL_COMMUNITY)
Admission: RE | Disposition: A | Discharge status: Home / Self Care | Payer: Self-pay | Source: Ambulatory Visit | Attending: Urology

## 2016-10-30 DIAGNOSIS — Z880 Allergy status to penicillin: Secondary | ICD-10-CM | POA: Insufficient documentation

## 2016-10-30 DIAGNOSIS — K5900 Constipation, unspecified: Secondary | ICD-10-CM | POA: Diagnosis not present

## 2016-10-30 DIAGNOSIS — N10 Acute tubulo-interstitial nephritis: Secondary | ICD-10-CM | POA: Insufficient documentation

## 2016-10-30 DIAGNOSIS — Z882 Allergy status to sulfonamides status: Secondary | ICD-10-CM | POA: Insufficient documentation

## 2016-10-30 DIAGNOSIS — N200 Calculus of kidney: Secondary | ICD-10-CM | POA: Diagnosis not present

## 2016-10-30 HISTORY — DX: Personal history of urinary calculi: Z87.442

## 2016-10-30 HISTORY — DX: Renal tubulo-interstitial disease, unspecified: N15.9

## 2016-10-30 HISTORY — PX: EXTRACORPOREAL SHOCK WAVE LITHOTRIPSY: SHX1557

## 2016-10-30 LAB — PREGNANCY, URINE: Preg Test, Ur: NEGATIVE

## 2016-10-30 SURGERY — LITHOTRIPSY, ESWL
Anesthesia: LOCAL | Laterality: Left

## 2016-10-30 MED ORDER — CIPROFLOXACIN HCL 500 MG PO TABS
500.0000 mg | ORAL_TABLET | ORAL | Status: AC
  Administered 2016-10-30: 500 mg via ORAL
  Filled 2016-10-30: qty 1

## 2016-10-30 MED ORDER — DIPHENHYDRAMINE HCL 25 MG PO CAPS
25.0000 mg | ORAL_CAPSULE | ORAL | Status: AC
  Administered 2016-10-30: 25 mg via ORAL
  Filled 2016-10-30: qty 1

## 2016-10-30 MED ORDER — SODIUM CHLORIDE 0.9 % IV SOLN
INTRAVENOUS | Status: DC
  Administered 2016-10-30: 12:00:00 via INTRAVENOUS

## 2016-10-30 MED ORDER — DIAZEPAM 5 MG PO TABS
10.0000 mg | ORAL_TABLET | ORAL | Status: AC
  Administered 2016-10-30: 10 mg via ORAL
  Filled 2016-10-30: qty 2

## 2016-10-30 NOTE — Op Note (Signed)
See [facility] OP note scanned into chart. Also because of the size, density, location and other factors that cannot be anticipated I feel this will likely be a staged procedure. This fact supersedes any indication in the scanned [facility] operative note to the contrary.  

## 2016-10-30 NOTE — Discharge Instructions (Signed)
See [facility] discharge instructions in chart.     Dietary Guidelines to Help Prevent Kidney Stones Kidney stones are deposits of minerals and salts that form inside your kidneys. Your risk of developing kidney stones may be greater depending on your diet, your lifestyle, the medicines you take, and whether you have certain medical conditions. Most people can reduce their chances of developing kidney stones by following the instructions below. Depending on your overall health and the type of kidney stones you tend to develop, your dietitian may give you more specific instructions. What are tips for following this plan? Reading food labels  Choose foods with "no salt added" or "low-salt" labels. Limit your sodium intake to less than 1500 mg per day.  Choose foods with calcium for each meal and snack. Try to eat about 300 mg of calcium at each meal. Foods that contain 200-500 mg of calcium per serving include: ? 8 oz (237 ml) of milk, fortified nondairy milk, and fortified fruit juice. ? 8 oz (237 ml) of kefir, yogurt, and soy yogurt. ? 4 oz (118 ml) of tofu. ? 1 oz of cheese. ? 1 cup (300 g) of dried figs. ? 1 cup (91 g) of cooked broccoli. ? 1-3 oz can of sardines or mackerel.  Most people need 1000 to 1500 mg of calcium each day. Talk to your dietitian about how much calcium is recommended for you. Shopping  Buy plenty of fresh fruits and vegetables. Most people do not need to avoid fruits and vegetables, even if they contain nutrients that may contribute to kidney stones.  When shopping for convenience foods, choose: ? Whole pieces of fruit. ? Premade salads with dressing on the side. ? Low-fat fruit and yogurt smoothies.  Avoid buying frozen meals or prepared deli foods.  Look for foods with live cultures, such as yogurt and kefir. Cooking  Do not add salt to food when cooking. Place a salt shaker on the table and allow each person to add his or her own salt to  taste.  Use vegetable protein, such as beans, textured vegetable protein (TVP), or tofu instead of meat in pasta, casseroles, and soups. Meal planning  Eat less salt, if told by your dietitian. To do this: ? Avoid eating processed or premade food. ? Avoid eating fast food.  Eat less animal protein, including cheese, meat, poultry, or fish, if told by your dietitian. To do this: ? Limit the number of times you have meat, poultry, fish, or cheese each week. Eat a diet free of meat at least 2 days a week. ? Eat only one serving each day of meat, poultry, fish, or seafood. ? When you prepare animal protein, cut pieces into small portion sizes. For most meat and fish, one serving is about the size of one deck of cards.  Eat at least 5 servings of fresh fruits and vegetables each day. To do this: ? Keep fruits and vegetables on hand for snacks. ? Eat 1 piece of fruit or a handful of berries with breakfast. ? Have a salad and fruit at lunch. ? Have two kinds of vegetables at dinner.  Limit foods that are high in a substance called oxalate. These include: ? Spinach. ? Rhubarb. ? Beets. ? Potato chips and french fries. ? Nuts.  If you regularly take a diuretic medicine, make sure to eat at least 1-2 fruits or vegetables high in potassium each day. These include: ? Avocado. ? Banana. ? Orange, prune, carrot, or  tomato juice. ? Baked potato. ? Cabbage. ? Beans and split peas. General instructions  Drink enough fluid to keep your urine clear or pale yellow. This is the most important thing you can do.  Talk to your health care provider and dietitian about taking daily supplements. Depending on your health and the cause of your kidney stones, you may be advised: ? Not to take supplements with vitamin C. ? To take a calcium supplement. ? To take a daily probiotic supplement. ? To take other supplements such as magnesium, fish oil, or vitamin B6.  Take all medicines and supplements as  told by your health care provider.  Limit alcohol intake to no more than 1 drink a day for nonpregnant women and 2 drinks a day for men. One drink equals 12 oz of beer, 5 oz of wine, or 1 oz of hard liquor.  Lose weight if told by your health care provider. Work with your dietitian to find strategies and an eating plan that works best for you. What foods are not recommended? Limit your intake of the following foods, or as told by your dietitian. Talk to your dietitian about specific foods you should avoid based on the type of kidney stones and your overall health. Grains Breads. Bagels. Rolls. Baked goods. Salted crackers. Cereal. Pasta. Vegetables Spinach. Rhubarb. Beets. Canned vegetables. Rosita Fire. Olives. Meats and other protein foods Nuts. Nut butters. Large portions of meat, poultry, or fish. Salted or cured meats. Deli meats. Hot dogs. Sausages. Dairy Cheese. Beverages Regular soft drinks. Regular vegetable juice. Seasonings and other foods Seasoning blends with salt. Salad dressings. Canned soups. Soy sauce. Ketchup. Barbecue sauce. Canned pasta sauce. Casseroles. Pizza. Lasagna. Frozen meals. Potato chips. [place] fries. Summary  You can reduce your risk of kidney stones by making changes to your diet.  The most important thing you can do is drink enough fluid. You should drink enough fluid to keep your urine clear or pale yellow.  Ask your health care provider or dietitian how much protein from animal sources you should eat each day, and also how much salt and calcium you should have each day. This information is not intended to replace advice given to you by your health care provider. Make sure you discuss any questions you have with your health care provider. Document Released: 08/16/2010 Document Revised: 04/01/2016 Document Reviewed: 04/01/2016 Elsevier Interactive Patient Education  2017 [employer].

## 2016-10-30 NOTE — H&P (Signed)
I have kidney stones.  HPI: Kelly Watts a 21 year-old female patient who was referred by Dr. Velna HatchetKawanta F. [place], MD who is here for renal calculi.  The problem is on the left side. She first stated noticing pain on approximately 10/01/2016. This is her first kidney [place]. She is currently having back pain. She denies having flank pain, groin pain, nausea, vomiting, fever, and chills. She has not caught a Minkin in her urine strainer since her symptoms began.   She has never had surgical treatment for calculi in the past.   10/14/2016: She was seen in the ER on 5/30 for left flank pain. Pain was sharp, intermittent, severe, nonradiating which resolved on 5/31. She now has dull left flank pain currently. She is currently on doxycycline for pyelonephritis     ALLERGIES: Penicillin sulfa    MEDICATIONS: Doxycycline Hyclate 100 mg capsule  Tamsulosin Hcl 0.4 mg capsule, ext release 24 hr  Childrens Chewable Vitamin  Naproxen  Norethindrone  Tramadol Hcl 50 mg tablet     GU PSH: None   NON-GU PSH: None   GU PMH: None   NON-GU PMH: None   FAMILY HISTORY: None   SOCIAL HISTORY: Marital Status: Single Current Smoking Status: Patient does not smoke anymore. Has not smoked since 09/02/2016. Smoked for 4 years.   Tobacco Use Assessment Completed: Used Tobacco in last 30 days? Drinks 4+ caffeinated drinks per day.    REVIEW OF SYSTEMS:    GU Review Female:   Patient reports frequent urination. Patient denies hard to postpone urination, burning /pain with urination, get up at night to urinate, leakage of urine, stream starts and stops, trouble starting your stream, have to strain to urinate, and being pregnant.  Gastrointestinal (Upper):   Patient denies nausea, vomiting, and indigestion/ heartburn.  Gastrointestinal (Lower):   Patient denies diarrhea and constipation.  Constitutional:   Patient denies fever, night sweats, weight loss, and fatigue.  Skin:   Patient denies skin rash/  lesion and itching.  Eyes:   Patient denies blurred vision and double vision.  Ears/ Nose/ Throat:   Patient denies sore throat and sinus problems.  Hematologic/Lymphatic:   Patient denies swollen glands and easy bruising.  Cardiovascular:   Patient denies leg swelling and chest pains.  Respiratory:   Patient reports cough. Patient denies shortness of breath.  Endocrine:   Patient denies excessive thirst.  Musculoskeletal:   Patient denies back pain and joint pain.  Neurological:   Patient denies headaches and dizziness.  Psychologic:   Patient denies depression and anxiety.   VITAL SIGNS:      10/14/2016 04:01 PM  Weight 100 lb / 45.36 kg  Height 63 in / 160.02 cm  BP 101/57 mmHg  Pulse 66 /min  BMI 17.7 kg/m   MULTI-SYSTEM PHYSICAL EXAMINATION:    Constitutional: Well-nourished. No physical deformities. Normally developed. Good grooming.  Neck: Neck symmetrical, not swollen. Normal tracheal position.  Respiratory: No labored breathing, no use of accessory muscles.   Cardiovascular: Normal temperature, normal extremity pulses, no swelling, no varicosities.  Lymphatic: No enlargement of neck, axillae, groin.  Skin: No paleness, no jaundice, no cyanosis. No lesion, no ulcer, no rash.  Neurologic / Psychiatric: Oriented to time, oriented to place, oriented to person. No depression, no anxiety, no agitation.  Gastrointestinal: No mass, no tenderness, no rigidity, non obese abdomen.  Eyes: Normal conjunctivae. Normal eyelids.  Ears, Nose, Mouth, and Throat: Left ear no scars, no lesions, no masses. Right ear  no scars, no lesions, no masses. Nose no scars, no lesions, no masses. Normal hearing. Normal lips.  Musculoskeletal: Normal gait and station of head and neck.     PAST DATA REVIEWED:  Source Of History:  Patient   PROCEDURES:         KUB - [id number]  A single view of the abdomen is obtained.  Bony Abnormalities:  no bony abnormalities  Fecal Stasis:  Severe fecal stasis.  Soft  Tissue:  no soft tissue abnormalities  Calculi:  Lower pole left kidney calculi. Left calculi size: 7mm               Urinalysis w/Scope - 81001 Dipstick Dipstick Cont'd Micro  Color: Yellow Bilirubin: Neg WBC/hpf: NS (Not Seen)  Appearance: Turbid Ketones: Neg RBC/hpf: NS (Not Seen)  Specific Gravity: 1.025 Blood: Neg Bacteria: Rare (0-9/hpf)  pH: 6.5 Protein: Neg Cystals: Amorph Urates  Glucose: Neg Urobilinogen: 0.2 Casts: NS (Not Seen)    Nitrites: Neg Trichomonas: Not Present    Leukocyte Esterase: Neg Mucous: Not Present      Epithelial Cells: 0 - 5/hpf      Yeast: NS (Not Seen)      Sperm: Not Present    Notes:  Microscopic done on unconcentrated urine     ASSESSMENT:      ICD-10 Details  1 GU:   Renal calculus - N20.0    PLAN:            Medications New Meds: Cefpodoxime Proxetil 100 mg tablet 1 tablet PO BID   #28  0 Refill(s)            Orders X-Rays: KUB          Schedule Return Visit/Planned Activity: 3 Months - Office Visit, Renal Ultrasound, KUB          Document Letter(s):  Created for Patient: Clinical Summary         Notes:   constipation:  -miralax 17 g daily for 30 days   pyelonephritis:  -vantin 100mg  BID for 14 days   Nephrolithiasis:  -We discussed observation versus ESWL versus URS and the patient elects for ESWL

## 2016-10-31 ENCOUNTER — Encounter (HOSPITAL_COMMUNITY): Payer: Self-pay | Admitting: Urology

## 2016-12-12 ENCOUNTER — Ambulatory Visit: Payer: BLUE CROSS/BLUE SHIELD | Admitting: Adult Health

## 2016-12-19 ENCOUNTER — Encounter: Payer: Self-pay | Admitting: Adult Health

## 2016-12-19 ENCOUNTER — Ambulatory Visit (INDEPENDENT_AMBULATORY_CARE_PROVIDER_SITE_OTHER): Payer: 59 | Admitting: Adult Health

## 2016-12-19 VITALS — BP 90/52 | HR 74 | Ht 63.0 in | Wt 104.5 lb

## 2016-12-19 DIAGNOSIS — N926 Irregular menstruation, unspecified: Secondary | ICD-10-CM | POA: Diagnosis not present

## 2016-12-19 DIAGNOSIS — O3680X0 Pregnancy with inconclusive fetal viability, not applicable or unspecified: Secondary | ICD-10-CM | POA: Insufficient documentation

## 2016-12-19 DIAGNOSIS — Z3A01 Less than 8 weeks gestation of pregnancy: Secondary | ICD-10-CM | POA: Insufficient documentation

## 2016-12-19 DIAGNOSIS — Z3201 Encounter for pregnancy test, result positive: Secondary | ICD-10-CM | POA: Diagnosis not present

## 2016-12-19 DIAGNOSIS — O09211 Supervision of pregnancy with history of pre-term labor, first trimester: Secondary | ICD-10-CM

## 2016-12-19 LAB — POCT URINE PREGNANCY: Preg Test, Ur: POSITIVE — AB

## 2016-12-19 NOTE — Patient Instructions (Signed)
First Trimester of Pregnancy The first trimester of pregnancy is from week 1 until the end of week 13 (months 1 through 3). A week after a sperm fertilizes an egg, the egg will implant on the wall of the uterus. This embryo will begin to develop into a baby. Genes from you and your partner will form the baby. The female genes will determine whether the baby will be a boy or a girl. At 6-8 weeks, the eyes and face will be formed, and the heartbeat can be seen on ultrasound. At the end of 12 weeks, all the baby's organs will be formed. Now that you are pregnant, you will want to do everything you can to have a healthy baby. Two of the most important things are to get good prenatal care and to follow your health care provider's instructions. Prenatal care is all the medical care you receive before the baby's birth. This care will help prevent, find, and treat any problems during the pregnancy and childbirth. Body changes during your first trimester Your body goes through many changes during pregnancy. The changes vary from woman to woman.  You may gain or lose a couple of pounds at first.  You may feel sick to your stomach (nauseous) and you may throw up (vomit). If the vomiting is uncontrollable, call your health care provider.  You may tire easily.  You may develop headaches that can be relieved by medicines. All medicines should be approved by your health care provider.  You may urinate more often. Painful urination may mean you have a bladder infection.  You may develop heartburn as a result of your pregnancy.  You may develop constipation because certain hormones are causing the muscles that push stool through your intestines to slow down.  You may develop hemorrhoids or swollen veins (varicose veins).  Your breasts may begin to grow larger and become tender. Your nipples may stick out more, and the tissue that surrounds them (areola) may become darker.  Your gums may bleed and may be  sensitive to brushing and flossing.  Dark spots or blotches (chloasma, mask of pregnancy) may develop on your face. This will likely fade after the baby is born.  Your menstrual periods will stop.  You may have a loss of appetite.  You may develop cravings for certain kinds of food.  You may have changes in your emotions from day to day, such as being excited to be pregnant or being concerned that something may go wrong with the pregnancy and baby.  You may have more vivid and strange dreams.  You may have changes in your hair. These can include thickening of your hair, rapid growth, and changes in texture. Some women also have hair loss during or after pregnancy, or hair that feels dry or thin. Your hair will most likely return to normal after your baby is born.  What to expect at prenatal visits During a routine prenatal visit:  You will be weighed to make sure you and the baby are growing normally.  Your blood pressure will be taken.  Your abdomen will be measured to track your baby's growth.  The fetal heartbeat will be listened to between weeks 10 and 14 of your pregnancy.  Test results from any previous visits will be discussed.  Your health care provider may ask you:  How you are feeling.  If you are feeling the baby move.  If you have had any abnormal symptoms, such as leaking fluid, bleeding, severe headaches,   or abdominal cramping.  If you are using any tobacco products, including cigarettes, chewing tobacco, and electronic cigarettes.  If you have any questions.  Other tests that may be performed during your first trimester include:  Blood tests to find your blood type and to check for the presence of any previous infections. The tests will also be used to check for low iron levels (anemia) and protein on red blood cells (Rh antibodies). Depending on your risk factors, or if you previously had diabetes during pregnancy, you may have tests to check for high blood  sugar that affects pregnant women (gestational diabetes).  Urine tests to check for infections, diabetes, or protein in the urine.  An ultrasound to confirm the proper growth and development of the baby.  Fetal screens for spinal cord problems (spina bifida) and Down syndrome.  HIV (human immunodeficiency virus) testing. Routine prenatal testing includes screening for HIV, unless you choose not to have this test.  You may need other tests to make sure you and the baby are doing well.  Follow these instructions at home: Medicines  Follow your health care provider's instructions regarding medicine use. Specific medicines may be either safe or unsafe to take during pregnancy.  Take a prenatal vitamin that contains at least 600 micrograms (mcg) of folic acid.  If you develop constipation, try taking a stool softener if your health care provider approves. Eating and drinking  Eat a balanced diet that includes fresh fruits and vegetables, whole grains, good sources of protein such as meat, eggs, or tofu, and low-fat dairy. Your health care provider will help you determine the amount of weight gain that is right for you.  Avoid raw meat and uncooked cheese. These carry germs that can cause birth defects in the baby.  Eating four or five small meals rather than three large meals a day may help relieve nausea and vomiting. If you start to feel nauseous, eating a few soda crackers can be helpful. Drinking liquids between meals, instead of during meals, also seems to help ease nausea and vomiting.  Limit foods that are high in fat and processed sugars, such as fried and sweet foods.  To prevent constipation: ? Eat foods that are high in fiber, such as fresh fruits and vegetables, whole grains, and beans. ? Drink enough fluid to keep your urine clear or pale yellow. Activity  Exercise only as directed by your health care provider. Most women can continue their usual exercise routine during  pregnancy. Try to exercise for 30 minutes at least 5 days a week. Exercising will help you: ? Control your weight. ? Stay in shape. ? Be prepared for labor and delivery.  Experiencing pain or cramping in the lower abdomen or lower back is a good sign that you should stop exercising. Check with your health care provider before continuing with normal exercises.  Try to avoid standing for long periods of time. Move your legs often if you must stand in one place for a long time.  Avoid heavy lifting.  Wear low-heeled shoes and practice good posture.  You may continue to have sex unless your health care provider tells you not to. Relieving pain and discomfort  Wear a good support bra to relieve breast tenderness.  Take warm sitz baths to soothe any pain or discomfort caused by hemorrhoids. Use hemorrhoid cream if your health care provider approves.  Rest with your legs elevated if you have leg cramps or low back pain.  If you develop   varicose veins in your legs, wear support hose. Elevate your feet for 15 minutes, 3-4 times a day. Limit salt in your diet. Prenatal care  Schedule your prenatal visits by the twelfth week of pregnancy. They are usually scheduled monthly at first, then more often in the last 2 months before delivery.  Write down your questions. Take them to your prenatal visits.  Keep all your prenatal visits as told by your health care provider. This is important. Safety  Wear your seat belt at all times when driving.  Make a list of emergency phone numbers, including numbers for family, friends, the hospital, and police and fire departments. General instructions  Ask your health care provider for a referral to a local prenatal education class. Begin classes no later than the beginning of month 6 of your pregnancy.  Ask for help if you have counseling or nutritional needs during pregnancy. Your health care provider can offer advice or refer you to specialists for help  with various needs.  Do not use hot tubs, steam rooms, or saunas.  Do not douche or use tampons or scented sanitary pads.  Do not cross your legs for long periods of time.  Avoid cat litter boxes and soil used by cats. These carry germs that can cause birth defects in the baby and possibly loss of the fetus by miscarriage or stillbirth.  Avoid all smoking, herbs, alcohol, and medicines not prescribed by your health care provider. Chemicals in these products affect the formation and growth of the baby.  Do not use any products that contain nicotine or tobacco, such as cigarettes and e-cigarettes. If you need help quitting, ask your health care provider. You may receive counseling support and other resources to help you quit.  Schedule a dentist appointment. At home, brush your teeth with a soft toothbrush and be gentle when you floss. Contact a health care provider if:  You have dizziness.  You have mild pelvic cramps, pelvic pressure, or nagging pain in the abdominal area.  You have persistent nausea, vomiting, or diarrhea.  You have a bad smelling vaginal discharge.  You have pain when you urinate.  You notice increased swelling in your face, hands, legs, or ankles.  You are exposed to fifth disease or chickenpox.  You are exposed to German measles (rubella) and have never had it. Get help right away if:  You have a fever.  You are leaking fluid from your vagina.  You have spotting or bleeding from your vagina.  You have severe abdominal cramping or pain.  You have rapid weight gain or loss.  You vomit blood or material that looks like coffee grounds.  You develop a severe headache.  You have shortness of breath.  You have any kind of trauma, such as from a fall or a car accident. Summary  The first trimester of pregnancy is from week 1 until the end of week 13 (months 1 through 3).  Your body goes through many changes during pregnancy. The changes vary from  woman to woman.  You will have routine prenatal visits. During those visits, your health care provider will examine you, discuss any test results you may have, and talk with you about how you are feeling. This information is not intended to replace advice given to you by your health care provider. Make sure you discuss any questions you have with your health care provider. Document Released: 04/15/2001 Document Revised: 04/02/2016 Document Reviewed: 04/02/2016 Elsevier Interactive Patient Education  2017 [employer] 2 weeks for [place]

## 2016-12-19 NOTE — Progress Notes (Signed)
Subjective:     Patient ID: Kelly Watts, female   DOB: 1996-03-16, 21 y.o.   MRN: [id number]  HPI Kelly Watts is a 21 year old white female, engaged getting married 02/07/17, in for UPT, has missed a period and had 1+HPT.She has history of preterm labor with delivery at 22+3 and baby died, in 03-Sep-2012. She had nexplanon removed in May.   Review of Systems +missed period, with +HPT Hips hurt Reviewed past medical,surgical, social and family history. Reviewed medications and allergies.     Objective:   Physical Exam BP (!) 90/52 (BP Location: Left Arm, Patient Position: Sitting, Cuff Size: Normal)   Pulse 74   Ht 5\' 3"  (1.6 m)   Wt 104 lb 8 oz (47.4 kg)   LMP 11/10/2016 (Exact Date)   BMI 18.51 kg/m UPT +, about 5+4 weeks by LMP with EDD 08/17/17. Skin warm and dry. Neck: mid line trachea, normal thyroid, good ROM, no lymphadenopathy noted. Lungs: clear to ausculation bilaterally. Cardiovascular: regular rate and rhythm.Abdomen is soft and non tender.     Assessment:     1. Pregnancy examination or test, positive result   2. Less than [redacted] weeks gestation of pregnancy   3. Encounter to determine fetal viability of pregnancy, single or unspecified fetus   4. History of preterm labor, current pregnancy, first trimester       Plan:     Return in 2 weeks for dating [place] Eat often Review handout on first trimester and by [facility]

## 2017-01-02 ENCOUNTER — Ambulatory Visit (INDEPENDENT_AMBULATORY_CARE_PROVIDER_SITE_OTHER): Payer: 59

## 2017-01-02 DIAGNOSIS — O3680X0 Pregnancy with inconclusive fetal viability, not applicable or unspecified: Secondary | ICD-10-CM

## 2017-01-02 NOTE — Progress Notes (Signed)
US 7+4 wks,single IUP w/ys,pos fht 157 bpm,right ovary not visualized,two simple corpus luteal cyst left ovary( #1) 2.1 x 1.6 x 1.8 cm,(#2) 2 x 1.7 x 2 cm,subchorionic hemorrhage 2.5 x  .3 x 2.1 cm,EDD 08/17/2017

## 2017-01-20 ENCOUNTER — Other Ambulatory Visit (HOSPITAL_COMMUNITY)
Admission: RE | Admit: 2017-01-20 | Discharge: 2017-01-20 | Disposition: A | Discharge status: Home / Self Care | Payer: 59 | Source: Ambulatory Visit | Attending: Obstetrics & Gynecology | Admitting: Obstetrics & Gynecology

## 2017-01-20 ENCOUNTER — Other Ambulatory Visit: Payer: Self-pay | Admitting: Women's Health

## 2017-01-20 ENCOUNTER — Ambulatory Visit: Payer: 59 | Admitting: *Deleted

## 2017-01-20 ENCOUNTER — Encounter: Payer: Self-pay | Admitting: Women's Health

## 2017-01-20 ENCOUNTER — Ambulatory Visit (INDEPENDENT_AMBULATORY_CARE_PROVIDER_SITE_OTHER): Payer: 59 | Admitting: Women's Health

## 2017-01-20 VITALS — BP 92/50 | HR 72 | Wt 101.6 lb

## 2017-01-20 DIAGNOSIS — Z124 Encounter for screening for malignant neoplasm of cervix: Secondary | ICD-10-CM

## 2017-01-20 DIAGNOSIS — O26891 Other specified pregnancy related conditions, first trimester: Secondary | ICD-10-CM

## 2017-01-20 DIAGNOSIS — Z23 Encounter for immunization: Secondary | ICD-10-CM

## 2017-01-20 DIAGNOSIS — Z8759 Personal history of other complications of pregnancy, childbirth and the puerperium: Secondary | ICD-10-CM

## 2017-01-20 DIAGNOSIS — Z3481 Encounter for supervision of other normal pregnancy, first trimester: Secondary | ICD-10-CM | POA: Insufficient documentation

## 2017-01-20 DIAGNOSIS — O360110 Maternal care for anti-D [Rh] antibodies, first trimester, not applicable or unspecified: Secondary | ICD-10-CM | POA: Diagnosis not present

## 2017-01-20 DIAGNOSIS — Z3682 Encounter for antenatal screening for nuchal translucency: Secondary | ICD-10-CM

## 2017-01-20 DIAGNOSIS — O09211 Supervision of pregnancy with history of pre-term labor, first trimester: Secondary | ICD-10-CM | POA: Diagnosis not present

## 2017-01-20 DIAGNOSIS — Z3A10 10 weeks gestation of pregnancy: Secondary | ICD-10-CM | POA: Diagnosis not present

## 2017-01-20 DIAGNOSIS — Z3490 Encounter for supervision of normal pregnancy, unspecified, unspecified trimester: Secondary | ICD-10-CM | POA: Insufficient documentation

## 2017-01-20 DIAGNOSIS — O09219 Supervision of pregnancy with history of pre-term labor, unspecified trimester: Secondary | ICD-10-CM

## 2017-01-20 DIAGNOSIS — Z331 Pregnant state, incidental: Secondary | ICD-10-CM

## 2017-01-20 DIAGNOSIS — Z1389 Encounter for screening for other disorder: Secondary | ICD-10-CM

## 2017-01-20 DIAGNOSIS — O09899 Supervision of other high risk pregnancies, unspecified trimester: Secondary | ICD-10-CM

## 2017-01-20 DIAGNOSIS — Z6791 Unspecified blood type, Rh negative: Secondary | ICD-10-CM

## 2017-01-20 DIAGNOSIS — Z8751 Personal history of pre-term labor: Secondary | ICD-10-CM | POA: Insufficient documentation

## 2017-01-20 LAB — POCT URINALYSIS DIPSTICK
Blood, UA: NEGATIVE
GLUCOSE UA: NEGATIVE
KETONES UA: NEGATIVE
Nitrite, UA: NEGATIVE
PROTEIN UA: NEGATIVE

## 2017-01-20 NOTE — Patient Instructions (Signed)

## 2017-01-20 NOTE — Progress Notes (Signed)
[facility] Initial OB Visit  Patient name: Kelly Watts MRN [id number]  Date of birth: March 21, 1996 CC & HPI:  Kelly Watts is a 21 y.o. G20P0100 Caucasian female at [redacted]w[redacted]d by LMP c/w 7wk u/s, with an Estimated Date of Delivery: 08/17/17 being seen today for her initial obstetrical visit. Her obstetrical history is significant for 22wk PPROM- then cord prolapse w/ IUFD while in hospital- also true knot noted at birth.  Today she reports some mild nausea- no vomiting, declines meds.  Review of Systems:   Denies cramping/contractions, leakage of fluid, vaginal bleeding, abnormal vaginal discharge w/ itching/odor/irritation, headaches, visual changes, shortness of breath, chest pain, abdominal pain, severe nausea/vomiting, or problems with urination or bowel movements.   Pertinent History Reviewed:   OB History  Gravida Para Term Preterm AB Living  SAB TAB Ectopic Multiple Live Births               # Outcome Date GA Lbr Len/2nd Weight Sex Delivery Anes PTL Lv  2 Current           1 Preterm 07/25/12 [redacted]w[redacted]d 07:35 / 00:04 15 oz (0.425 kg) F Vag-Spont None  FD     Reviewed past medical,surgical and family history.  Reviewed problem list, medications and allergies.  Objective Findings:   Vitals:   01/20/17 1101  BP: (!) 92/50  Pulse: 72  Weight: 101 lb 9.6 oz (46.1 kg)    Body mass index is 18 kg/m.  Exam System:     General: Well developed & nourished, no acute distress   Skin: Warm & dry, normal coloration and turgor, no rashes   Neurologic: Alert & oriented, normal mood   Cardiovascular: Regular rate & rhythm   Respiratory: Effort & rate normal, LCTAB, acyanotic   Abdomen: Soft, non tender   Extremities: normal strength, tone   Pelvic Exam:    Perineum: Normal perineum   Vulva: Normal, no lesions   Vagina:  Normal mucosa, normal discharge   Cervix: Normal, bulbous, appears closed   Uterus: Normal size/shape/contour for GA   Pap smear: obtained w/  reflex high risk HPV cotesting FHR: + via informal transabdominal u/s, +active fetus  Results for orders placed or performed in visit on 01/20/17 (from the past 24 hour(s))  POCT urinalysis dipstick   Collection Time: 01/20/17 11:27 AM  Result Value Ref Range   Color, UA     Clarity, UA     Glucose, UA neg    Bilirubin, UA     Ketones, UA neg    Spec Grav, UA  1.010 - 1.025   Blood, UA neg    pH, UA  5.0 - 8.0   Protein, UA neg    Urobilinogen, UA  0.2 or 1.0 E.U./dL   Nitrite, UA neg    Leukocytes, UA Small (1+) (A) Negative     Assessment:   1) Low-Risk Pregnancy G2P0100 at [redacted]w[redacted]d with an Estimated Date of Delivery: 08/17/17  2) Initial OB visit 3) H/O 22wk PTB d/t PPROM, cord prolapse w/ IUFD, true knot noted at birth  Plan:  Initial labs obtained Continue prenatal vitamins Reviewed n/v relief measures and warning s/s to report Reviewed recommended weight gain based on pre-gravid BMI Encouraged well-balanced diet Genetic Screening discussed Integrated Screen: requested Cystic fibrosis screening discussed declined Ultrasound discussed; fetal survey: requested CCNC completed Discussed/offered [facility] for h/o 22wk PTB d/t PPROM, wants, order faxed today Return  in about 3 weeks (around 02/10/2017) for LROB, US:NT+1stIT.   Orders Placed This Encounter  Procedures  . Urine Culture  . US Fetal Nuchal Translucency Measurement  . Flu Vaccine QUAD 36+ mos IM  . CBC  . Hepatitis B surface antigen  . HIV antibody  . Varicella zoster antibody, IgG  . Urinalysis, Routine w reflex microscopic  . Rubella screen  . RPR  . POCT urinalysis dipstick  . ABO/Rh  . Antibody screen   Flu shot today  Marge Duncans CNM, [facility] 01/20/2017 12:51 PM

## 2017-01-21 LAB — MICROSCOPIC EXAMINATION: CASTS: NONE SEEN /LPF

## 2017-01-21 LAB — URINALYSIS, ROUTINE W REFLEX MICROSCOPIC
Bilirubin, UA: NEGATIVE
Glucose, UA: NEGATIVE
Ketones, UA: NEGATIVE
NITRITE UA: NEGATIVE
PH UA: 8 — AB (ref 5.0–7.5)
Protein, UA: NEGATIVE
RBC, UA: NEGATIVE
SPEC GRAV UA: 1.021 (ref 1.005–1.030)
Urobilinogen, Ur: 0.2 mg/dL (ref 0.2–1.0)

## 2017-01-21 LAB — CBC
Hematocrit: 37.8 % (ref 34.0–46.6)
Hemoglobin: 12.8 g/dL (ref 11.1–15.9)
MCH: 30.5 pg (ref 26.6–33.0)
MCHC: 33.9 g/dL (ref 31.5–35.7)
MCV: 90 fL (ref 79–97)
Platelets: 234 10*3/uL (ref 150–379)
RBC: 4.19 x10E6/uL (ref 3.77–5.28)
RDW: 12.7 % (ref 12.3–15.4)
WBC: 8.5 10*3/uL (ref 3.4–10.8)

## 2017-01-21 LAB — ANTIBODY SCREEN: ANTIBODY SCREEN: NEGATIVE

## 2017-01-21 LAB — CYTOLOGY - PAP
CHLAMYDIA, DNA PROBE: NEGATIVE
Diagnosis: NEGATIVE
NEISSERIA GONORRHEA: NEGATIVE

## 2017-01-21 LAB — VARICELLA ZOSTER ANTIBODY, IGG: VARICELLA: 2558 {index} (ref >165)

## 2017-01-21 LAB — ABO/RH: Rh Factor: NEGATIVE

## 2017-01-21 LAB — HIV ANTIBODY (ROUTINE TESTING W REFLEX): HIV SCREEN 4TH GENERATION: NONREACTIVE

## 2017-01-21 LAB — RUBELLA SCREEN: Rubella Antibodies, IGG: 2.89 index (ref >0.99)

## 2017-01-21 LAB — HEPATITIS B SURFACE ANTIGEN: HEP B S AG: NEGATIVE

## 2017-01-21 LAB — RPR: RPR: NONREACTIVE

## 2017-01-22 LAB — URINE CULTURE: Organism ID, Bacteria: NO GROWTH

## 2017-02-10 ENCOUNTER — Ambulatory Visit (INDEPENDENT_AMBULATORY_CARE_PROVIDER_SITE_OTHER): Payer: 59 | Admitting: Obstetrics & Gynecology

## 2017-02-10 ENCOUNTER — Encounter: Payer: Self-pay | Admitting: Obstetrics & Gynecology

## 2017-02-10 ENCOUNTER — Ambulatory Visit (INDEPENDENT_AMBULATORY_CARE_PROVIDER_SITE_OTHER): Payer: 59

## 2017-02-10 VITALS — BP 96/52 | HR 79 | Wt 100.5 lb

## 2017-02-10 DIAGNOSIS — Z3481 Encounter for supervision of other normal pregnancy, first trimester: Secondary | ICD-10-CM

## 2017-02-10 DIAGNOSIS — Z3A13 13 weeks gestation of pregnancy: Secondary | ICD-10-CM

## 2017-02-10 DIAGNOSIS — Z1389 Encounter for screening for other disorder: Secondary | ICD-10-CM

## 2017-02-10 DIAGNOSIS — Z3682 Encounter for antenatal screening for nuchal translucency: Secondary | ICD-10-CM | POA: Diagnosis not present

## 2017-02-10 DIAGNOSIS — Z331 Pregnant state, incidental: Secondary | ICD-10-CM

## 2017-02-10 DIAGNOSIS — Z3402 Encounter for supervision of normal first pregnancy, second trimester: Secondary | ICD-10-CM

## 2017-02-10 LAB — POCT URINALYSIS DIPSTICK
Blood, UA: NEGATIVE
Glucose, UA: NEGATIVE
Ketones, UA: NEGATIVE
LEUKOCYTES UA: NEGATIVE
NITRITE UA: NEGATIVE
Protein, UA: NEGATIVE

## 2017-02-10 NOTE — Progress Notes (Signed)
[place] 13+1 wks,measurements c/w dates,normal ovaries bilat,NB present,NT 1.5 mm,crl 71.34 mm,post pl gr 0,fhr 166 bpm

## 2017-02-10 NOTE — Progress Notes (Signed)
G2P0100 [redacted]w[redacted]d Estimated Date of Delivery: 08/17/17  Blood pressure (!) 96/52, pulse 79, weight 100 lb 8 oz (45.6 kg), last menstrual period 11/10/2016.   BP weight and urine results all reviewed and noted.  Please refer to the obstetrical flow sheet for the fundal height and fetal heart rate documentation:  Patient reports good fetal movement, denies any bleeding and no rupture of membranes symptoms or regular contractions. Patient is without complaints. All questions were answered.  Orders Placed This Encounter  Procedures  . Integrated 1  . POCT urinalysis dipstick    Plan:  Continued routine obstetrical care, NT looks normal, sonogram report is done  Return in about 3 weeks (around 03/03/2017) for 17P, LROB, 2nd IT.

## 2017-02-16 LAB — INTEGRATED 1
Crown Rump Length: 71.3 mm
Gest. Age on Collection Date: 13 weeks
Maternal Age at EDD: 22 yr
NUCHAL TRANSLUCENCY (NT): 1.5 mm
Number of Fetuses: 1
PAPP-A VALUE: 3361.4 ng/mL
Weight: 101 [lb_av]

## 2017-02-19 ENCOUNTER — Telehealth: Payer: Self-pay | Admitting: Obstetrics & Gynecology

## 2017-02-24 ENCOUNTER — Telehealth: Payer: Self-pay | Admitting: *Deleted

## 2017-02-24 NOTE — Telephone Encounter (Signed)
Carmita with [facility] called stating pt's M[facility] needs a PA. I spoke with Tish, RN and she stated she [place] to [place] [facility] this [place] and pt's [employer] will not cover Makena until pt is exactly [redacted] weeks pregnant. She will be 16 weeks on 10/29. JSY

## 2017-02-26 ENCOUNTER — Encounter: Payer: Self-pay | Admitting: Physician Assistant

## 2017-02-26 ENCOUNTER — Ambulatory Visit (INDEPENDENT_AMBULATORY_CARE_PROVIDER_SITE_OTHER): Payer: 59 | Admitting: Physician Assistant

## 2017-02-26 VITALS — BP 102/58 | HR 117 | Temp 98.1°F | Resp 18 | Wt 104.8 lb

## 2017-02-26 DIAGNOSIS — T7840XA Allergy, unspecified, initial encounter: Secondary | ICD-10-CM

## 2017-02-26 DIAGNOSIS — R079 Chest pain, unspecified: Secondary | ICD-10-CM

## 2017-02-26 NOTE — Progress Notes (Signed)
Patient ID: NIKKIA DEVOSS MRN: [id number], DOB: [birth year], 21 y.o. Date of Encounter: 02/26/2017, 11:28 AM    Chief Complaint:  Chief Complaint  Patient presents with  . Chest Pain  . Allergic Reaction     HPI: 21 y.o. year old female presents with above.   She reports that she developed the symptoms of an allergic reaction last night at about 8:00 PM. She reports that she had nausea and vomiting that lasted for about 10-15 minutes and then that resolved. She reports that she developed a rash "all over from head to toe" -- says that by about 10:15 PM that started to fade. States that last night she noticed her neck hurting and that this morning noticed her chest hurting. That's why she felt she should come in to get checked/evaluated. States that she did take a Benadryl last night. Used no other treatment.  She states that she ate dinner at about 5:30 or 6:00 PM at "[employer] [place]in [place].  She states "this happens about once a month ".  Regarding the episode last night--- asked if she had any type of seafood--- she did not. I discussed that often it is the sauce that is used at restaurants that can cause allergic reaction. She states that she eats the same food from [employer] very often and it does not cause reaction.  She states that that is the way these reactions have been. Says that she can have food at home and have a reaction but has had the same food plenty of other times with no reaction.  In regards to the episode last night--- did not have anything else to eat or drink after eating that dinner up until the time of the episode. Cannot think of anything else that she came in contact with that she doesn't come in contact with on a daily basis that could've caused the reaction.    Home Meds:   Outpatient Medications Prior to Visit  Medication Sig Dispense Refill  . Prenatal Vit-Fe Fumarate-FA (PRENATAL VITAMIN PO) Take by mouth daily.     No  facility-administered medications prior to visit.     Allergies:  Allergies  Allergen Reactions  . Penicillins Hives  . Sulfa Antibiotics       Review of Systems: See HPI for pertinent ROS. All other ROS negative.    Physical Exam: Blood pressure (!) 102/58, pulse 77, temperature 98.1 F (36.7 C), temperature source Oral, resp. rate 18, weight 47.5 kg (104 lb 12.8 oz), last menstrual period 11/10/2016, SpO2 97 %., Body mass index is 18.56 kg/m. General:  WF. Appears in no acute distress. Neck: Supple. No thyromegaly. No lymphadenopathy. Lungs: Clear bilaterally to auscultation without wheezes, rales, or rhonchi. Breathing is unlabored. Heart: Regular rhythm. No murmurs, rubs, or gallops. Msk:  She does have tenderness with palpation of the left chest. No tenderness with palpation of the right chest. She does have tenderness with palpation of the left neck. No tenderness with palpation of the right neck. Skin: Rash is resolved. No rash present at this time. Neuro: Alert and oriented X 3. Moves all extremities spontaneously. Gait is normal. CNII-XII grossly in tact. Psych:  Responds to questions appropriately with a normal affect.   EKG shows normal sinus rhythm. Normal EKG.  ASSESSMENT AND PLAN:  21 y.o. year old female with  1. Allergic reaction, initial encounter Discussed seeing an allergist for allergy testing. She defers at this time. Therefore recommend that she  keep documentation where she can document date type of reaction and most recent food/drink/contact with products and try to follow a pattern to see what is causing the allergic reaction.Avoid allergens. Reassured her that I feel that the neck pain and chest pain she has experienced is musculoskeletal and not cardiac.    Signed, [address]Mary Beth ArthurtownDixon, [place], [facility] 02/26/2017 11:28 AM

## 2017-03-03 ENCOUNTER — Encounter: Payer: Self-pay | Admitting: Women's Health

## 2017-03-03 ENCOUNTER — Telehealth: Payer: Self-pay | Admitting: *Deleted

## 2017-03-03 NOTE — Telephone Encounter (Signed)
[facility] approved.   Patient states copay for [facility] is $2000 which she cannot afford.   [name] who stated she left the patient a message and told her to call her back if she had a high copay and she could get her set up with copay assistance.   Notified patient and told her to call Angela back. Pt verbalized understanding.

## 2017-03-04 ENCOUNTER — Encounter: Payer: Self-pay | Admitting: Advanced Practice Midwife

## 2017-03-04 ENCOUNTER — Ambulatory Visit (INDEPENDENT_AMBULATORY_CARE_PROVIDER_SITE_OTHER): Payer: 59 | Admitting: Advanced Practice Midwife

## 2017-03-04 VITALS — BP 90/50 | HR 95 | Wt 102.4 lb

## 2017-03-04 DIAGNOSIS — Z1389 Encounter for screening for other disorder: Secondary | ICD-10-CM

## 2017-03-04 DIAGNOSIS — Z1379 Encounter for other screening for genetic and chromosomal anomalies: Secondary | ICD-10-CM

## 2017-03-04 DIAGNOSIS — Z3A16 16 weeks gestation of pregnancy: Secondary | ICD-10-CM

## 2017-03-04 DIAGNOSIS — Z331 Pregnant state, incidental: Secondary | ICD-10-CM

## 2017-03-04 DIAGNOSIS — O09212 Supervision of pregnancy with history of pre-term labor, second trimester: Secondary | ICD-10-CM | POA: Diagnosis not present

## 2017-03-04 DIAGNOSIS — Z8751 Personal history of pre-term labor: Secondary | ICD-10-CM

## 2017-03-04 DIAGNOSIS — Z363 Encounter for antenatal screening for malformations: Secondary | ICD-10-CM

## 2017-03-04 LAB — POCT URINALYSIS DIPSTICK
GLUCOSE UA: NEGATIVE
Leukocytes, UA: NEGATIVE
Nitrite, UA: NEGATIVE
Protein, UA: NEGATIVE
RBC UA: NEGATIVE

## 2017-03-04 MED ORDER — HYDROXYPROGESTERONE CAPROATE 275 MG/1.1ML ~~LOC~~ SOAJ
275.0000 mg | SUBCUTANEOUS | Status: DC
  Administered 2017-03-04: 275 mg via SUBCUTANEOUS

## 2017-03-04 NOTE — Progress Notes (Signed)
PT here for 17P injection given lt arm. Tolerated well. Return 1 week for next injection.pad CMA

## 2017-03-04 NOTE — Progress Notes (Signed)
G2P0100 4372w2d Estimated Date of Delivery: 08/17/17  Blood pressure (!) 90/50, pulse 95, weight 102 lb 6.4 oz (46.4 kg), last menstrual period 11/10/2016.   BP weight and urine results all reviewed and noted.  Please refer to the obstetrical flow sheet for the fundal height and fetal heart rate documentation:  Patient denies any bleeding and no rupture of membranes symptoms or regular contractions. Patient is without complaints. All questions were answered.  Orders Placed This Encounter  Procedures  . US OB Comp + 14 Wk  . INTEGRATED 2  . POCT urinalysis dipstick    Plan:  Continued routine obstetrical care, start 17p today  Return in about 1 week (around 03/11/2017) for 17p and 3 weeks for anatomy scan/LROB.

## 2017-03-10 LAB — INTEGRATED 2
AFP MARKER: 44 ng/mL
AFP MOM: 1.13
Crown Rump Length: 71.3 mm
DIA MOM: 1.36
DIA Value: 294.9 pg/mL
Estriol, Unconjugated: 1.11 ng/mL
GEST. AGE ON COLLECTION DATE: 13 wk
GESTATIONAL AGE: 16.1 wk
Maternal Age at EDD: 22 yr
NUCHAL TRANSLUCENCY MOM: 0.85
Nuchal Translucency (NT): 1.5 mm
Number of Fetuses: 1
PAPP-A MOM: 1.9
PAPP-A VALUE: 3361.4 ng/mL
TEST RESULTS: NEGATIVE
WEIGHT: 101 [lb_av]
WEIGHT: 102 [lb_av]
hCG MoM: 2.7
hCG Value: 107.5 IU/mL
uE3 MoM: 1.3

## 2017-03-11 ENCOUNTER — Ambulatory Visit (INDEPENDENT_AMBULATORY_CARE_PROVIDER_SITE_OTHER): Payer: 59 | Admitting: *Deleted

## 2017-03-11 ENCOUNTER — Encounter: Payer: Self-pay | Admitting: *Deleted

## 2017-03-11 VITALS — BP 110/50 | HR 89 | Ht 64.0 in | Wt 104.6 lb

## 2017-03-11 DIAGNOSIS — O09211 Supervision of pregnancy with history of pre-term labor, first trimester: Secondary | ICD-10-CM | POA: Diagnosis not present

## 2017-03-11 DIAGNOSIS — Z1389 Encounter for screening for other disorder: Secondary | ICD-10-CM

## 2017-03-11 DIAGNOSIS — O09892 Supervision of other high risk pregnancies, second trimester: Secondary | ICD-10-CM

## 2017-03-11 DIAGNOSIS — Z3A17 17 weeks gestation of pregnancy: Secondary | ICD-10-CM | POA: Diagnosis not present

## 2017-03-11 DIAGNOSIS — O09212 Supervision of pregnancy with history of pre-term labor, second trimester: Secondary | ICD-10-CM | POA: Diagnosis not present

## 2017-03-11 DIAGNOSIS — Z331 Pregnant state, incidental: Secondary | ICD-10-CM | POA: Diagnosis not present

## 2017-03-11 LAB — POCT URINALYSIS DIPSTICK
GLUCOSE UA: NEGATIVE
Ketones, UA: NEGATIVE
NITRITE UA: NEGATIVE
Protein, UA: NEGATIVE
RBC UA: NEGATIVE

## 2017-03-11 MED ORDER — HYDROXYPROGESTERONE CAPROATE 250 MG/ML IM OIL
250.0000 mg | TOPICAL_OIL | Freq: Once | INTRAMUSCULAR | Status: AC
  Administered 2017-03-11: 250 mg via INTRAMUSCULAR

## 2017-03-11 MED FILL — MAKENA 250 MG/ML VIAL: 250 | 28 days supply | Qty: 4 | Fill #0

## 2017-03-11 NOTE — Progress Notes (Signed)
Pt here for 17P. Pt tolerated shot well. Return in 1 week for next shot. JSY 

## 2017-03-18 ENCOUNTER — Ambulatory Visit (INDEPENDENT_AMBULATORY_CARE_PROVIDER_SITE_OTHER): Payer: 59 | Admitting: *Deleted

## 2017-03-18 VITALS — BP 88/48 | HR 84 | Ht 64.0 in | Wt 106.5 lb

## 2017-03-18 DIAGNOSIS — Z3A18 18 weeks gestation of pregnancy: Secondary | ICD-10-CM | POA: Diagnosis not present

## 2017-03-18 DIAGNOSIS — O09212 Supervision of pregnancy with history of pre-term labor, second trimester: Secondary | ICD-10-CM

## 2017-03-18 DIAGNOSIS — O09892 Supervision of other high risk pregnancies, second trimester: Secondary | ICD-10-CM

## 2017-03-18 DIAGNOSIS — Z3482 Encounter for supervision of other normal pregnancy, second trimester: Secondary | ICD-10-CM

## 2017-03-18 MED ORDER — HYDROXYPROGESTERONE CAPROATE 250 MG/ML IM OIL
250.0000 mg | TOPICAL_OIL | Freq: Once | INTRAMUSCULAR | Status: AC
  Administered 2017-03-18: 250 mg via INTRAMUSCULAR

## 2017-03-18 NOTE — Progress Notes (Signed)
Pt here for 17P. Pt tolerated shot well. Return in 1 week for next shot. Pt complains of left hip pain. I advised it may be the way the baby is laying. Can try Tylenol for discomfort and heating pad on warm setting. Pt has a scheduled appt Friday. Advised to mention it then or call sooner if pain gets worse. Pt voiced understanding. JSY

## 2017-03-20 ENCOUNTER — Encounter: Payer: Self-pay | Admitting: Women's Health

## 2017-03-20 ENCOUNTER — Ambulatory Visit (INDEPENDENT_AMBULATORY_CARE_PROVIDER_SITE_OTHER): Payer: 59 | Admitting: Women's Health

## 2017-03-20 ENCOUNTER — Ambulatory Visit (INDEPENDENT_AMBULATORY_CARE_PROVIDER_SITE_OTHER): Payer: 59

## 2017-03-20 VITALS — BP 100/52 | HR 84 | Wt 109.5 lb

## 2017-03-20 DIAGNOSIS — Z3A18 18 weeks gestation of pregnancy: Secondary | ICD-10-CM

## 2017-03-20 DIAGNOSIS — O09899 Supervision of other high risk pregnancies, unspecified trimester: Secondary | ICD-10-CM

## 2017-03-20 DIAGNOSIS — Z3482 Encounter for supervision of other normal pregnancy, second trimester: Secondary | ICD-10-CM

## 2017-03-20 DIAGNOSIS — Z363 Encounter for antenatal screening for malformations: Secondary | ICD-10-CM | POA: Diagnosis not present

## 2017-03-20 DIAGNOSIS — O09219 Supervision of pregnancy with history of pre-term labor, unspecified trimester: Secondary | ICD-10-CM

## 2017-03-20 DIAGNOSIS — Z331 Pregnant state, incidental: Secondary | ICD-10-CM

## 2017-03-20 DIAGNOSIS — Z3480 Encounter for supervision of other normal pregnancy, unspecified trimester: Secondary | ICD-10-CM

## 2017-03-20 DIAGNOSIS — Z1389 Encounter for screening for other disorder: Secondary | ICD-10-CM

## 2017-03-20 DIAGNOSIS — O09212 Supervision of pregnancy with history of pre-term labor, second trimester: Secondary | ICD-10-CM

## 2017-03-20 LAB — POCT URINALYSIS DIPSTICK
Blood, UA: NEGATIVE
Glucose, UA: NEGATIVE
Ketones, UA: NEGATIVE
LEUKOCYTES UA: NEGATIVE
NITRITE UA: NEGATIVE

## 2017-03-20 NOTE — Progress Notes (Signed)
Anatomy US today at 18+[redacted] weeks GA. Single, active female fetus in a cephalic presentation. Cervix is closed and measures 3.7 cm. Amniotic fluid is subjectively normal with SVP 5.2 cm. Posterior Gr 0 placenta. Left ovary appears normal. Right ovary not well visualized. FHR 154 bpm. EFW 272 g is consistent with dating. Limited views of spine and nose/lip.  All other anatomy visualized and appears normal.

## 2017-03-20 NOTE — Progress Notes (Signed)
   LOW-RISK PREGNANCY VISIT Patient name: Carney HarderCourtney J Fann MRN [id number]  Date of birth: [birth year] Chief Complaint:   Routine Prenatal Visit (US today; left hip pain that goes into toes)  History of Present Illness:   Carney HarderCourtney J Born is a 21 y.o. 422P0100 female at 2820w4d with an Estimated Date of Delivery: 08/17/17 being seen today for ongoing management of a low-risk pregnancy.  Today she reports pain Lt hip shoots down to toes. Contractions: Not present. Vag. Bleeding: None.  Movement: Present. denies leaking of fluid. Review of Systems:   Pertinent items are noted in HPI Denies abnormal vaginal discharge w/ itching/odor/irritation, headaches, visual changes, shortness of breath, chest pain, abdominal pain, severe nausea/vomiting, or problems with urination or bowel movements unless otherwise stated above. Pertinent History Reviewed:  Reviewed past medical,surgical, social, obstetrical and family history.  Reviewed problem list, medications and allergies. Physical Assessment:   Vitals:   03/20/17 1121  BP: (!) 100/52  Pulse: 84  Weight: 109 lb 8 oz (49.7 kg)  Body mass index is 18.8 kg/m.        Physical Examination:   General appearance: Well appearing, and in no distress  Mental status: Alert, oriented to person, place, and time  Skin: Warm & dry  Cardiovascular: Normal heart rate noted  Respiratory: Normal respiratory effort, no distress  Abdomen: Soft, gravid, nontender  Pelvic: Cervical exam deferred         Extremities: Edema: None  Fetal Status: Fetal Heart Rate (bpm): +u/s   Movement: Present    Per Carrie: Anatomy U/S normal, limited spine, will need repeat u/s  Results for orders placed or performed in visit on 03/20/17 (from the past 24 hour(s))  POCT urinalysis dipstick   Collection Time: 03/20/17 12:03 PM  Result Value Ref Range   Color, UA     Clarity, UA     Glucose, UA neg    Bilirubin, UA     Ketones, UA neg    Spec Grav, UA  1.010 - 1.025   Blood, UA  neg    pH, UA  5.0 - 8.0   Protein, UA trace    Urobilinogen, UA  0.2 or 1.0 E.U./dL   Nitrite, UA neg    Leukocytes, UA Negative Negative    Assessment & Plan:  1) Low-risk pregnancy G2P0100 at 6820w4d with an Estimated Date of Delivery: 08/17/17   2) H/O PTB, weekly Makena, got on Wed   Labs/procedures today: anatomy u/s  Plan:  Continue routine obstetrical care   Reviewed: Preterm labor symptoms and general obstetric precautions including but not limited to vaginal bleeding, contractions, leaking of fluid and fetal movement were reviewed in detail with the patient.  All questions were answered  Follow-up: Return in about 12 days (around 04/01/2017) for LROB, US:OB F/U spine; , Weekly 17P. Ask for Wed appts from now on to keep Makena/appts on same day  Orders Placed This Encounter  Procedures  . US OB Follow Up  . POCT urinalysis dipstick   Marge DuncansBooker, Tanzie Rothschild Randall CNM, [facility]NP-BC 03/20/2017 12:57 PM

## 2017-03-20 NOTE — Progress Notes (Signed)
21 

## 2017-03-20 NOTE — Patient Instructions (Signed)
Kelly Watts, I greatly value your feedback.  If you receive a survey following your visit with us today, we appreciate you taking the time to fill it out.  Thanks, Joellyn HaffKim Emmery Seiler, CNM, WHNP-BC   Second Trimester of Pregnancy The second trimester is from week 14 through week 27 (months 4 through 6). The second trimester is often a time when you feel your best. Your body has adjusted to being pregnant, and you begin to feel better physically. Usually, morning sickness has lessened or quit completely, you may have more energy, and you may have an increase in appetite. The second trimester is also a time when the fetus is growing rapidly. At the end of the sixth month, the fetus is about 9 inches long and weighs about 1 pounds. You will likely begin to feel the baby move (quickening) between 16 and 20 weeks of pregnancy. Body changes during your second trimester Your body continues to go through many changes during your second trimester. The changes vary from woman to woman.  Your weight will continue to increase. You will notice your lower abdomen bulging out.  You may begin to get stretch marks on your hips, abdomen, and breasts.  You may develop headaches that can be relieved by medicines. The medicines should be approved by your health care provider.  You may urinate more often because the fetus is pressing on your bladder.  You may develop or continue to have heartburn as a result of your pregnancy.  You may develop constipation because certain hormones are causing the muscles that push waste through your intestines to slow down.  You may develop hemorrhoids or swollen, bulging veins (varicose veins).  You may have back pain. This is caused by: ? Weight gain. ? Pregnancy hormones that are relaxing the joints in your pelvis. ? A shift in weight and the muscles that support your balance.  Your breasts will continue to grow and they will continue to become tender.  Your gums may bleed  and may be sensitive to brushing and flossing.  Dark spots or blotches (chloasma, mask of pregnancy) may develop on your face. This will likely fade after the baby is born.  A dark line from your belly button to the pubic area (linea nigra) may appear. This will likely fade after the baby is born.  You may have changes in your hair. These can include thickening of your hair, rapid growth, and changes in texture. Some women also have hair loss during or after pregnancy, or hair that feels dry or thin. Your hair will most likely return to normal after your baby is born.  What to expect at prenatal visits During a routine prenatal visit:  You will be weighed to make sure you and the fetus are growing normally.  Your blood pressure will be taken.  Your abdomen will be measured to track your baby's growth.  The fetal heartbeat will be listened to.  Any test results from the previous visit will be discussed.  Your health care provider may ask you:  How you are feeling.  If you are feeling the baby move.  If you have had any abnormal symptoms, such as leaking fluid, bleeding, severe headaches, or abdominal cramping.  If you are using any tobacco products, including cigarettes, chewing tobacco, and electronic cigarettes.  If you have any questions.  Other tests that may be performed during your second trimester include:  Blood tests that check for: ? Low iron levels (anemia). ? High  blood sugar that affects pregnant women (gestational diabetes) between 3 and 28 weeks. ? Rh antibodies. This is to check for a protein on red blood cells (Rh factor).  Urine tests to check for infections, diabetes, or protein in the urine.  An ultrasound to confirm the proper growth and development of the baby.  An amniocentesis to check for possible genetic problems.  Fetal screens for spina bifida and Down syndrome.  HIV (human immunodeficiency virus) testing. Routine prenatal testing includes  screening for HIV, unless you choose not to have this test.  Follow these instructions at home: Medicines  Follow your health care provider's instructions regarding medicine use. Specific medicines may be either safe or unsafe to take during pregnancy.  Take a prenatal vitamin that contains at least 600 micrograms (mcg) of folic acid.  If you develop constipation, try taking a stool softener if your health care provider approves. Eating and drinking  Eat a balanced diet that includes fresh fruits and vegetables, whole grains, good sources of protein such as meat, eggs, or tofu, and low-fat dairy. Your health care provider will help you determine the amount of weight gain that is right for you.  Avoid raw meat and uncooked cheese. These carry germs that can cause birth defects in the baby.  If you have low calcium intake from food, talk to your health care provider about whether you should take a daily calcium supplement.  Limit foods that are high in fat and processed sugars, such as fried and sweet foods.  To prevent constipation: ? Drink enough fluid to keep your urine clear or pale yellow. ? Eat foods that are high in fiber, such as fresh fruits and vegetables, whole grains, and beans. Activity  Exercise only as directed by your health care provider. Most women can continue their usual exercise routine during pregnancy. Try to exercise for 30 minutes at least 5 days a week. Stop exercising if you experience uterine contractions.  Avoid heavy lifting, wear low heel shoes, and practice good posture.  A sexual relationship may be continued unless your health care provider directs you otherwise. Relieving pain and discomfort  Wear a good support bra to prevent discomfort from breast tenderness.  Take warm sitz baths to soothe any pain or discomfort caused by hemorrhoids. Use hemorrhoid cream if your health care provider approves.  Rest with your legs elevated if you have leg cramps  or low back pain.  If you develop varicose veins, wear support hose. Elevate your feet for 15 minutes, 3-4 times a day. Limit salt in your diet. Prenatal Care  Write down your questions. Take them to your prenatal visits.  Keep all your prenatal visits as told by your health care provider. This is important. Safety  Wear your seat belt at all times when driving.  Make a list of emergency phone numbers, including numbers for family, friends, the hospital, and police and fire departments. General instructions  Ask your health care provider for a referral to a local prenatal education class. Begin classes no later than the beginning of month 6 of your pregnancy.  Ask for help if you have counseling or nutritional needs during pregnancy. Your health care provider can offer advice or refer you to specialists for help with various needs.  Do not use hot tubs, steam rooms, or saunas.  Do not douche or use tampons or scented sanitary pads.  Do not cross your legs for long periods of time.  Avoid cat litter boxes and soil  used by cats. These carry germs that can cause birth defects in the baby and possibly loss of the fetus by miscarriage or stillbirth.  Avoid all smoking, herbs, alcohol, and unprescribed drugs. Chemicals in these products can affect the formation and growth of the baby.  Do not use any products that contain nicotine or tobacco, such as cigarettes and e-cigarettes. If you need help quitting, ask your health care provider.  Visit your dentist if you have not gone yet during your pregnancy. Use a soft toothbrush to brush your teeth and be gentle when you floss. Contact a health care provider if:  You have dizziness.  You have mild pelvic cramps, pelvic pressure, or nagging pain in the abdominal area.  You have persistent nausea, vomiting, or diarrhea.  You have a bad smelling vaginal discharge.  You have pain when you urinate. Get help right away if:  You have a  fever.  You are leaking fluid from your vagina.  You have spotting or bleeding from your vagina.  You have severe abdominal cramping or pain.  You have rapid weight gain or weight loss.  You have shortness of breath with chest pain.  You notice sudden or extreme swelling of your face, hands, ankles, feet, or legs.  You have not felt your baby move in over an hour.  You have severe headaches that do not go away when you take medicine.  You have vision changes. Summary  The second trimester is from week 14 through week 27 (months 4 through 6). It is also a time when the fetus is growing rapidly.  Your body goes through many changes during pregnancy. The changes vary from woman to woman.  Avoid all smoking, herbs, alcohol, and unprescribed drugs. These chemicals affect the formation and growth your baby.  Do not use any tobacco products, such as cigarettes, chewing tobacco, and e-cigarettes. If you need help quitting, ask your health care provider.  Contact your health care provider if you have any questions. Keep all prenatal visits as told by your health care provider. This is important. This information is not intended to replace advice given to you by your health care provider. Make sure you discuss any questions you have with your health care provider. Document Released: 04/15/2001 Document Revised: 09/27/2015 Document Reviewed: 06/22/2012 [employer] Interactive Patient Education  2017 [employer]    Sciatica Rehab Ask your health care provider which exercises are safe for you. Do exercises exactly as told by your health care provider and adjust them as directed. It is normal to feel mild stretching, pulling, tightness, or discomfort as you do these exercises, but you should stop right away if you feel sudden pain or your pain gets worse.Do not begin these exercises until told by your health care provider. Stretching and range of motion exercises These exercises warm up your  muscles and joints and improve the movement and flexibility of your hips and your back. These exercises also help to relieve pain, numbness, and tingling. Exercise A: Sciatic nerve glide 1. Sit in a chair with your head facing down toward your chest. Place your hands behind your back. Let your shoulders slump forward. 2. Slowly straighten one of your knees while you tilt your head back as if you are looking toward the ceiling. Only straighten your leg as far as you can without making your symptoms worse. 3. Hold for __________ seconds. 4. Slowly return to the starting position. 5. Repeat with your other leg. Repeat __________ times. Complete  this exercise __________ times a day. Exercise B: Knee to chest with hip adduction and internal rotation  1. Lie on your back on a firm surface with both legs straight. 2. Bend one of your knees and move it up toward your chest until you feel a gentle stretch in your lower back and buttock. Then, move your knee toward the shoulder that is on the opposite side from your leg. ? Hold your leg in this position by holding onto the front of your knee. 3. Hold for __________ seconds. 4. Slowly return to the starting position. 5. Repeat with your other leg. Repeat __________ times. Complete this exercise __________ times a day. Exercise C: Prone extension on elbows  1. Lie on your abdomen on a firm surface. A bed may be too soft for this exercise. 2. Prop yourself up on your elbows. 3. Use your arms to help lift your chest up until you feel a gentle stretch in your abdomen and your lower back. ? This will place some of your body weight on your elbows. If this is uncomfortable, try stacking pillows under your chest. ? Your hips should stay down, against the surface that you are lying on. Keep your hip and back muscles relaxed. 4. Hold for __________ seconds. 5. Slowly relax your upper body and return to the starting position. Repeat __________ times. Complete  this exercise __________ times a day. Strengthening exercises These exercises build strength and endurance in your back. Endurance is the ability to use your muscles for a long time, even after they get tired. Exercise D: Pelvic tilt 1. Lie on your back on a firm surface. Bend your knees and keep your feet flat. 2. Tense your abdominal muscles. Tip your pelvis up toward the ceiling and flatten your lower back into the floor. ? To help with this exercise, you may place a small towel under your lower back and try to push your back into the towel. 3. Hold for __________ seconds. 4. Let your muscles relax completely before you repeat this exercise. Repeat __________ times. Complete this exercise __________ times a day. Exercise E: Alternating arm and leg raises  1. Get on your hands and knees on a firm surface. If you are on a hard floor, you may want to use padding to cushion your knees, such as an exercise mat. 2. Line up your arms and legs. Your hands should be below your shoulders, and your knees should be below your hips. 3. Lift your left leg behind you. At the same time, raise your right arm and straighten it in front of you. ? Do not lift your leg higher than your hip. ? Do not lift your arm higher than your shoulder. ? Keep your abdominal and back muscles tight. ? Keep your hips facing the ground. ? Do not arch your back. ? Keep your balance carefully, and do not hold your breath. 4. Hold for __________ seconds. 5. Slowly return to the starting position and repeat with your right leg and your left arm. Repeat __________ times. Complete this exercise __________ times a day. Posture and body mechanics  Body mechanics refers to the movements and positions of your body while you do your daily activities. Posture is part of body mechanics. Good posture and healthy body mechanics can help to relieve stress in your body's tissues and joints. Good posture means that your spine is in its  natural S-curve position (your spine is neutral), your shoulders are pulled back slightly, and your  head is not tipped forward. The following are general guidelines for applying improved posture and body mechanics to your everyday activities. Standing   When standing, keep your spine neutral and your feet about hip-width apart. Keep a slight bend in your knees. Your ears, shoulders, and hips should line up.  When you do a task in which you stand in one place for a long time, place one foot up on a stable object that is 2-4 inches (5-10 cm) high, such as a footstool. This helps keep your spine neutral. Sitting   When sitting, keep your spine neutral and keep your feet flat on the floor. Use a footrest, if necessary, and keep your thighs parallel to the floor. Avoid rounding your shoulders, and avoid tilting your head forward.  When working at a desk or a computer, keep your desk at a height where your hands are slightly lower than your elbows. Slide your chair under your desk so you are close enough to maintain good posture.  When working at a computer, place your monitor at a height where you are looking straight ahead and you do not have to tilt your head forward or downward to look at the screen. Resting   When lying down and resting, avoid positions that are most painful for you.  If you have pain with activities such as sitting, bending, stooping, or squatting (flexion-based activities), lie in a position in which your body does not bend very much. For example, avoid curling up on your side with your arms and knees near your chest (fetal position).  If you have pain with activities such as standing for a long time or reaching with your arms (extension-based activities), lie with your spine in a neutral position and bend your knees slightly. Try the following positions: ? Lying on your side with a pillow between your knees. ? Lying on your back with a pillow under your  knees. Lifting   When lifting objects, keep your feet at least shoulder-width apart and tighten your abdominal muscles.  Bend your knees and hips and keep your spine neutral. It is important to lift using the strength of your legs, not your back. Do not lock your knees straight out.  Always ask for help to lift heavy or awkward objects. This information is not intended to replace advice given to you by your health care provider. Make sure you discuss any questions you have with your health care provider. Document Released: 04/21/2005 Document Revised: 12/27/2015 Document Reviewed: 01/05/2015 Else[employer] Interactive Patient Education  [employer]2018 [employer]

## 2017-03-25 ENCOUNTER — Ambulatory Visit (INDEPENDENT_AMBULATORY_CARE_PROVIDER_SITE_OTHER): Payer: 59 | Admitting: *Deleted

## 2017-03-25 ENCOUNTER — Encounter: Payer: Self-pay | Admitting: *Deleted

## 2017-03-25 VITALS — BP 92/52

## 2017-03-25 DIAGNOSIS — Z331 Pregnant state, incidental: Secondary | ICD-10-CM

## 2017-03-25 DIAGNOSIS — Z3A19 19 weeks gestation of pregnancy: Secondary | ICD-10-CM

## 2017-03-25 DIAGNOSIS — Z1389 Encounter for screening for other disorder: Secondary | ICD-10-CM | POA: Diagnosis not present

## 2017-03-25 DIAGNOSIS — O09899 Supervision of other high risk pregnancies, unspecified trimester: Secondary | ICD-10-CM

## 2017-03-25 DIAGNOSIS — O09219 Supervision of pregnancy with history of pre-term labor, unspecified trimester: Secondary | ICD-10-CM | POA: Diagnosis not present

## 2017-03-25 LAB — POCT URINALYSIS DIPSTICK
Blood, UA: NEGATIVE
Glucose, UA: NEGATIVE
Ketones, UA: NEGATIVE
Leukocytes, UA: NEGATIVE
NITRITE UA: NEGATIVE
Protein, UA: NEGATIVE

## 2017-03-25 MED ORDER — HYDROXYPROGESTERONE CAPROATE 250 MG/ML IM OIL
250.0000 mg | TOPICAL_OIL | INTRAMUSCULAR | Status: DC
  Administered 2017-03-25 – 2017-04-07 (×3): 250 mg via INTRAMUSCULAR

## 2017-03-25 NOTE — Progress Notes (Signed)
17P IM given in right ventrogluteal with no complications. Pt to return next week for next injeciton.

## 2017-03-31 ENCOUNTER — Other Ambulatory Visit: Payer: Self-pay | Admitting: Women's Health

## 2017-03-31 ENCOUNTER — Ambulatory Visit (INDEPENDENT_AMBULATORY_CARE_PROVIDER_SITE_OTHER): Payer: 59

## 2017-03-31 ENCOUNTER — Ambulatory Visit (INDEPENDENT_AMBULATORY_CARE_PROVIDER_SITE_OTHER): Payer: 59 | Admitting: Women's Health

## 2017-03-31 ENCOUNTER — Encounter: Payer: Self-pay | Admitting: Women's Health

## 2017-03-31 VITALS — BP 102/50 | HR 100 | Wt 111.0 lb

## 2017-03-31 DIAGNOSIS — Z363 Encounter for antenatal screening for malformations: Secondary | ICD-10-CM

## 2017-03-31 DIAGNOSIS — Z331 Pregnant state, incidental: Secondary | ICD-10-CM

## 2017-03-31 DIAGNOSIS — Z3482 Encounter for supervision of other normal pregnancy, second trimester: Secondary | ICD-10-CM

## 2017-03-31 DIAGNOSIS — IMO0002 Reserved for concepts with insufficient information to code with codable children: Secondary | ICD-10-CM

## 2017-03-31 DIAGNOSIS — O09212 Supervision of pregnancy with history of pre-term labor, second trimester: Secondary | ICD-10-CM | POA: Diagnosis not present

## 2017-03-31 DIAGNOSIS — Z0489 Encounter for examination and observation for other specified reasons: Secondary | ICD-10-CM

## 2017-03-31 DIAGNOSIS — Z3A20 20 weeks gestation of pregnancy: Secondary | ICD-10-CM

## 2017-03-31 DIAGNOSIS — Z3402 Encounter for supervision of normal first pregnancy, second trimester: Secondary | ICD-10-CM

## 2017-03-31 DIAGNOSIS — Z1389 Encounter for screening for other disorder: Secondary | ICD-10-CM

## 2017-03-31 LAB — POCT URINALYSIS DIPSTICK
GLUCOSE UA: NEGATIVE
KETONES UA: NEGATIVE
Leukocytes, UA: NEGATIVE
NITRITE UA: NEGATIVE
Protein, UA: NEGATIVE
RBC UA: NEGATIVE

## 2017-03-31 NOTE — Progress Notes (Signed)
US 20+1 wks,cephalic,cx 3.2 cm,post pl gr 0,normal ovaries bilat,svp of fluid 4.2 cm,fhr 153 bpm,efw 345 g,anatomy of the spine and face complete,no obvious abnormalities

## 2017-03-31 NOTE — Patient Instructions (Signed)
Kelly Watts, I greatly value your feedback.  If you receive a survey following your visit with us today, we appreciate you taking the time to fill it out.  Thanks, Joellyn HaffKim Israel Wunder, CNM, WHNP-BC   Second Trimester of Pregnancy The second trimester is from week 14 through week 27 (months 4 through 6). The second trimester is often a time when you feel your best. Your body has adjusted to being pregnant, and you begin to feel better physically. Usually, morning sickness has lessened or quit completely, you may have more energy, and you may have an increase in appetite. The second trimester is also a time when the fetus is growing rapidly. At the end of the sixth month, the fetus is about 9 inches long and weighs about 1 pounds. You will likely begin to feel the baby move (quickening) between 16 and 20 weeks of pregnancy. Body changes during your second trimester Your body continues to go through many changes during your second trimester. The changes vary from woman to woman.  Your weight will continue to increase. You will notice your lower abdomen bulging out.  You may begin to get stretch marks on your hips, abdomen, and breasts.  You may develop headaches that can be relieved by medicines. The medicines should be approved by your health care provider.  You may urinate more often because the fetus is pressing on your bladder.  You may develop or continue to have heartburn as a result of your pregnancy.  You may develop constipation because certain hormones are causing the muscles that push waste through your intestines to slow down.  You may develop hemorrhoids or swollen, bulging veins (varicose veins).  You may have back pain. This is caused by: ? Weight gain. ? Pregnancy hormones that are relaxing the joints in your pelvis. ? A shift in weight and the muscles that support your balance.  Your breasts will continue to grow and they will continue to become tender.  Your gums may bleed  and may be sensitive to brushing and flossing.  Dark spots or blotches (chloasma, mask of pregnancy) may develop on your face. This will likely fade after the baby is born.  A dark line from your belly button to the pubic area (linea nigra) may appear. This will likely fade after the baby is born.  You may have changes in your hair. These can include thickening of your hair, rapid growth, and changes in texture. Some women also have hair loss during or after pregnancy, or hair that feels dry or thin. Your hair will most likely return to normal after your baby is born.  What to expect at prenatal visits During a routine prenatal visit:  You will be weighed to make sure you and the fetus are growing normally.  Your blood pressure will be taken.  Your abdomen will be measured to track your baby's growth.  The fetal heartbeat will be listened to.  Any test results from the previous visit will be discussed.  Your health care provider may ask you:  How you are feeling.  If you are feeling the baby move.  If you have had any abnormal symptoms, such as leaking fluid, bleeding, severe headaches, or abdominal cramping.  If you are using any tobacco products, including cigarettes, chewing tobacco, and electronic cigarettes.  If you have any questions.  Other tests that may be performed during your second trimester include:  Blood tests that check for: ? Low iron levels (anemia). ? High  blood sugar that affects pregnant women (gestational diabetes) between 3 and 28 weeks. ? Rh antibodies. This is to check for a protein on red blood cells (Rh factor).  Urine tests to check for infections, diabetes, or protein in the urine.  An ultrasound to confirm the proper growth and development of the baby.  An amniocentesis to check for possible genetic problems.  Fetal screens for spina bifida and Down syndrome.  HIV (human immunodeficiency virus) testing. Routine prenatal testing includes  screening for HIV, unless you choose not to have this test.  Follow these instructions at home: Medicines  Follow your health care provider's instructions regarding medicine use. Specific medicines may be either safe or unsafe to take during pregnancy.  Take a prenatal vitamin that contains at least 600 micrograms (mcg) of folic acid.  If you develop constipation, try taking a stool softener if your health care provider approves. Eating and drinking  Eat a balanced diet that includes fresh fruits and vegetables, whole grains, good sources of protein such as meat, eggs, or tofu, and low-fat dairy. Your health care provider will help you determine the amount of weight gain that is right for you.  Avoid raw meat and uncooked cheese. These carry germs that can cause birth defects in the baby.  If you have low calcium intake from food, talk to your health care provider about whether you should take a daily calcium supplement.  Limit foods that are high in fat and processed sugars, such as fried and sweet foods.  To prevent constipation: ? Drink enough fluid to keep your urine clear or pale yellow. ? Eat foods that are high in fiber, such as fresh fruits and vegetables, whole grains, and beans. Activity  Exercise only as directed by your health care provider. Most women can continue their usual exercise routine during pregnancy. Try to exercise for 30 minutes at least 5 days a week. Stop exercising if you experience uterine contractions.  Avoid heavy lifting, wear low heel shoes, and practice good posture.  A sexual relationship may be continued unless your health care provider directs you otherwise. Relieving pain and discomfort  Wear a good support bra to prevent discomfort from breast tenderness.  Take warm sitz baths to soothe any pain or discomfort caused by hemorrhoids. Use hemorrhoid cream if your health care provider approves.  Rest with your legs elevated if you have leg cramps  or low back pain.  If you develop varicose veins, wear support hose. Elevate your feet for 15 minutes, 3-4 times a day. Limit salt in your diet. Prenatal Care  Write down your questions. Take them to your prenatal visits.  Keep all your prenatal visits as told by your health care provider. This is important. Safety  Wear your seat belt at all times when driving.  Make a list of emergency phone numbers, including numbers for family, friends, the hospital, and police and fire departments. General instructions  Ask your health care provider for a referral to a local prenatal education class. Begin classes no later than the beginning of month 6 of your pregnancy.  Ask for help if you have counseling or nutritional needs during pregnancy. Your health care provider can offer advice or refer you to specialists for help with various needs.  Do not use hot tubs, steam rooms, or saunas.  Do not douche or use tampons or scented sanitary pads.  Do not cross your legs for long periods of time.  Avoid cat litter boxes and soil  used by cats. These carry germs that can cause birth defects in the baby and possibly loss of the fetus by miscarriage or stillbirth.  Avoid all smoking, herbs, alcohol, and unprescribed drugs. Chemicals in these products can affect the formation and growth of the baby.  Do not use any products that contain nicotine or tobacco, such as cigarettes and e-cigarettes. If you need help quitting, ask your health care provider.  Visit your dentist if you have not gone yet during your pregnancy. Use a soft toothbrush to brush your teeth and be gentle when you floss. Contact a health care provider if:  You have dizziness.  You have mild pelvic cramps, pelvic pressure, or nagging pain in the abdominal area.  You have persistent nausea, vomiting, or diarrhea.  You have a bad smelling vaginal discharge.  You have pain when you urinate. Get help right away if:  You have a  fever.  You are leaking fluid from your vagina.  You have spotting or bleeding from your vagina.  You have severe abdominal cramping or pain.  You have rapid weight gain or weight loss.  You have shortness of breath with chest pain.  You notice sudden or extreme swelling of your face, hands, ankles, feet, or legs.  You have not felt your baby move in over an hour.  You have severe headaches that do not go away when you take medicine.  You have vision changes. Summary  The second trimester is from week 14 through week 27 (months 4 through 6). It is also a time when the fetus is growing rapidly.  Your body goes through many changes during pregnancy. The changes vary from woman to woman.  Avoid all smoking, herbs, alcohol, and unprescribed drugs. These chemicals affect the formation and growth your baby.  Do not use any tobacco products, such as cigarettes, chewing tobacco, and e-cigarettes. If you need help quitting, ask your health care provider.  Contact your health care provider if you have any questions. Keep all prenatal visits as told by your health care provider. This is important. This information is not intended to replace advice given to you by your health care provider. Make sure you discuss any questions you have with your health care provider. Document Released: 04/15/2001 Document Revised: 09/27/2015 Document Reviewed: 06/22/2012 Elsevier Interactive Patient Education  2017 [employer].

## 2017-03-31 NOTE — Progress Notes (Signed)
   LOW-RISK PREGNANCY VISIT Patient name: Kelly HarderCourtney J Watts MRN [id number]  Date of birth: [birth year] Chief Complaint:   Routine Prenatal Visit  History of Present Illness:   Kelly HarderCourtney J Watts is a 21 y.o. 182P0100 female at 1577w1d with an Estimated Date of Delivery: 08/17/17 being seen today for ongoing management of a low-risk pregnancy.  Today she reports no complaints. Is getting Makena on Tuesdays now and we will be closed Tues 12/25, [facility] to give that dose if possible. Contractions: Not present. Vag. Bleeding: None.  Movement: Present. denies leaking of fluid. Review of Systems:   Pertinent items are noted in HPI Denies abnormal vaginal discharge w/ itching/odor/irritation, headaches, visual changes, shortness of breath, chest pain, abdominal pain, severe nausea/vomiting, or problems with urination or bowel movements unless otherwise stated above. Pertinent History Reviewed:  Reviewed past medical,surgical, social, obstetrical and family history.  Reviewed problem list, medications and allergies. Physical Assessment:   Vitals:   03/31/17 1511  BP: (!) 102/50  Pulse: 100  Weight: 111 lb (50.3 kg)  Body mass index is 19.05 kg/m.        Physical Examination:   General appearance: Well appearing, and in no distress  Mental status: Alert, oriented to person, place, and time  Skin: Warm & dry  Cardiovascular: Normal heart rate noted  Respiratory: Normal respiratory effort, no distress  Abdomen: Soft, gravid, nontender  Pelvic: Cervical exam deferred         Extremities: Edema: None  Fetal Status: Fetal Heart Rate (bpm): 153 u/s   Movement: Present    US 20+1 wks,cephalic,cx 3.2 cm,post pl gr 0,normal ovaries bilat,svp of fluid 4.2 cm,fhr 153 bpm,efw 345 g,anatomy of the spine and face complete,no obvious abnormalities    Results for orders placed or performed in visit on 03/31/17 (from the past 24 hour(s))  POCT Urinalysis Dipstick   Collection Time:  03/31/17  3:13 PM  Result Value Ref Range   Color, UA     Clarity, UA     Glucose, UA neg    Bilirubin, UA     Ketones, UA neg    Spec Grav, UA  1.010 - 1.025   Blood, UA neg    pH, UA  5.0 - 8.0   Protein, UA neg    Urobilinogen, UA  0.2 or 1.0 E.U./dL   Nitrite, UA neg    Leukocytes, UA Negative Negative    Assessment & Plan:  1) Low-risk pregnancy G2P0100 at 777w1d with an Estimated Date of Delivery: 08/17/17   2) H/O PTB, weekly Makena, got dose today, OK for RN friend to give on 12/25   Labs/procedures today: u/s  Plan:  Continue routine obstetrical care   Reviewed: Preterm labor symptoms and general obstetric precautions including but not limited to vaginal bleeding, contractions, leaking of fluid and fetal movement were reviewed in detail with the patient.  All questions were answered  Follow-up: Return in about 4 weeks (around 04/28/2017) for Weekly 17P, LROB.  Orders Placed This Encounter  Procedures  . POCT Urinalysis Dipstick   Marge DuncansBooker, Genevieve Arbaugh Randall CNM, [facility] 03/31/2017 4:09 PM

## 2017-04-07 ENCOUNTER — Ambulatory Visit (INDEPENDENT_AMBULATORY_CARE_PROVIDER_SITE_OTHER): Payer: 59 | Admitting: *Deleted

## 2017-04-07 VITALS — BP 102/50

## 2017-04-07 DIAGNOSIS — Z1389 Encounter for screening for other disorder: Secondary | ICD-10-CM | POA: Diagnosis not present

## 2017-04-07 DIAGNOSIS — O09212 Supervision of pregnancy with history of pre-term labor, second trimester: Secondary | ICD-10-CM

## 2017-04-07 DIAGNOSIS — Z331 Pregnant state, incidental: Secondary | ICD-10-CM | POA: Diagnosis not present

## 2017-04-07 LAB — POCT URINALYSIS DIPSTICK
GLUCOSE UA: NEGATIVE
Ketones, UA: NEGATIVE
Leukocytes, UA: NEGATIVE
NITRITE UA: NEGATIVE
Protein, UA: NEGATIVE
RBC UA: NEGATIVE

## 2017-04-07 MED FILL — MAKENA 275 MG/1.1ML SOAJ: 275 | 28 days supply | Qty: 4 | Fill #0 | Status: TO

## 2017-04-14 ENCOUNTER — Ambulatory Visit (INDEPENDENT_AMBULATORY_CARE_PROVIDER_SITE_OTHER): Payer: 59 | Admitting: *Deleted

## 2017-04-14 ENCOUNTER — Encounter: Payer: Self-pay | Admitting: *Deleted

## 2017-04-14 VITALS — BP 94/50 | HR 88 | Ht 63.0 in | Wt 113.0 lb

## 2017-04-14 DIAGNOSIS — Z3A22 22 weeks gestation of pregnancy: Secondary | ICD-10-CM | POA: Diagnosis not present

## 2017-04-14 DIAGNOSIS — Z331 Pregnant state, incidental: Secondary | ICD-10-CM | POA: Diagnosis not present

## 2017-04-14 DIAGNOSIS — O09212 Supervision of pregnancy with history of pre-term labor, second trimester: Secondary | ICD-10-CM

## 2017-04-14 DIAGNOSIS — Z1389 Encounter for screening for other disorder: Secondary | ICD-10-CM | POA: Diagnosis not present

## 2017-04-14 DIAGNOSIS — O09219 Supervision of pregnancy with history of pre-term labor, unspecified trimester: Principal | ICD-10-CM

## 2017-04-14 DIAGNOSIS — O09899 Supervision of other high risk pregnancies, unspecified trimester: Secondary | ICD-10-CM

## 2017-04-14 LAB — POCT URINALYSIS DIPSTICK
Glucose, UA: NEGATIVE
Ketones, UA: NEGATIVE
Leukocytes, UA: NEGATIVE
NITRITE UA: NEGATIVE
PROTEIN UA: NEGATIVE
RBC UA: NEGATIVE

## 2017-04-14 MED ORDER — HYDROXYPROGESTERONE CAPROATE 275 MG/1.1ML ~~LOC~~ SOAJ
275.0000 mg | Freq: Once | SUBCUTANEOUS | Status: AC
  Administered 2017-04-14: 275 mg via SUBCUTANEOUS

## 2017-04-14 NOTE — Progress Notes (Signed)
Pt given Makena 275mg  SQ left arm without complications. Advised pt return in 1 week for next injection.

## 2017-04-21 ENCOUNTER — Ambulatory Visit (INDEPENDENT_AMBULATORY_CARE_PROVIDER_SITE_OTHER): Payer: 59

## 2017-04-21 VITALS — BP 100/50 | HR 92 | Wt 113.4 lb

## 2017-04-21 DIAGNOSIS — Z331 Pregnant state, incidental: Secondary | ICD-10-CM | POA: Diagnosis not present

## 2017-04-21 DIAGNOSIS — Z1389 Encounter for screening for other disorder: Secondary | ICD-10-CM

## 2017-04-21 DIAGNOSIS — O09212 Supervision of pregnancy with history of pre-term labor, second trimester: Secondary | ICD-10-CM

## 2017-04-21 DIAGNOSIS — O09892 Supervision of other high risk pregnancies, second trimester: Secondary | ICD-10-CM

## 2017-04-21 DIAGNOSIS — Z3482 Encounter for supervision of other normal pregnancy, second trimester: Secondary | ICD-10-CM

## 2017-04-21 LAB — POCT URINALYSIS DIPSTICK
Glucose, UA: NEGATIVE
Ketones, UA: NEGATIVE
LEUKOCYTES UA: NEGATIVE
Nitrite, UA: NEGATIVE
RBC UA: NEGATIVE

## 2017-04-21 MED ORDER — HYDROXYPROGESTERONE CAPROATE 275 MG/1.1ML ~~LOC~~ SOAJ
275.0000 mg | Freq: Once | SUBCUTANEOUS | Status: AC
  Administered 2017-04-21: 275 mg via SUBCUTANEOUS

## 2017-04-21 NOTE — Progress Notes (Signed)
PT  here for 17 P injection 275 mg IM given SQ rt arm. Tolerated well. Return 1 week for next injection.pad CMA

## 2017-05-05 NOTE — L&D Delivery Note (Signed)
Patient is 22 y.o. G2P0100 5624w1d admitted for IOL for h/o IUFD. S/p IOL with Pitocin. AROM at 1320.  Prenatal course also complicated by anemia, RH negative.  Delivery Note At 4:49 PM a viable female was delivered via Vaginal, Spontaneous (Presentation: LOA).  APGAR: 8, 9; weight pending.   Placenta status: Intact.  Cord: 3V with the following complications: None .  Cord pH: N/A  Anesthesia: Epidural  Episiotomy:  None Lacerations:  None Est. Blood Loss (mL):  150  Mom to postpartum.  Baby to Couplet care / Skin to Skin.  Head delivered LOA. No nuchal cord present. Shoulder and body delivered in usual fashion after manual assistance due to poor maternal effort. Infant with spontaneous cry, placed on mother's abdomen, dried and bulb suctioned. Cord clamped x 2 after 1-minute delay, and cut by family member. Cord blood drawn. Placenta delivered spontaneously with gentle cord traction. Fundus firm with massage and Pitocin. Perineum inspected and found to be hemostatic.  Kelly AdaJazma Ciana Simmon, DO 08/11/2017, 5:04 PM

## 2017-05-06 ENCOUNTER — Encounter: Payer: Self-pay | Admitting: Women's Health

## 2017-05-07 ENCOUNTER — Encounter: Payer: Self-pay | Admitting: *Deleted

## 2017-05-08 ENCOUNTER — Telehealth: Payer: Self-pay | Admitting: *Deleted

## 2017-05-08 MED FILL — MAKENA 275 MG/1.1ML SOAJ: 275 | 28 days supply | Qty: 4 | Fill #0

## 2017-05-08 NOTE — Telephone Encounter (Signed)
[facility] notified and will ship Makena to our office. Should arrive Tuesday.

## 2017-05-08 NOTE — Telephone Encounter (Signed)
Called [facility] who stated [facility] been [facility].  It can be shipped but starting Jan 1, there is now a $5 shipping fee.  Attempted to call patient but phone off.  Will send mychart message.

## 2017-05-11 ENCOUNTER — Encounter: Payer: Self-pay | Admitting: *Deleted

## 2017-05-12 ENCOUNTER — Ambulatory Visit (INDEPENDENT_AMBULATORY_CARE_PROVIDER_SITE_OTHER): Payer: 59 | Admitting: Women's Health

## 2017-05-12 ENCOUNTER — Encounter: Payer: Self-pay | Admitting: Women's Health

## 2017-05-12 VITALS — BP 100/60 | HR 97 | Wt 120.4 lb

## 2017-05-12 DIAGNOSIS — O09212 Supervision of pregnancy with history of pre-term labor, second trimester: Secondary | ICD-10-CM | POA: Diagnosis not present

## 2017-05-12 DIAGNOSIS — Z1389 Encounter for screening for other disorder: Secondary | ICD-10-CM

## 2017-05-12 DIAGNOSIS — O09892 Supervision of other high risk pregnancies, second trimester: Secondary | ICD-10-CM

## 2017-05-12 DIAGNOSIS — Z3A26 26 weeks gestation of pregnancy: Secondary | ICD-10-CM

## 2017-05-12 DIAGNOSIS — Z331 Pregnant state, incidental: Secondary | ICD-10-CM

## 2017-05-12 DIAGNOSIS — Z3482 Encounter for supervision of other normal pregnancy, second trimester: Secondary | ICD-10-CM

## 2017-05-12 LAB — POCT URINALYSIS DIPSTICK
Blood, UA: NEGATIVE
Glucose, UA: NEGATIVE
KETONES UA: NEGATIVE
Leukocytes, UA: NEGATIVE
NITRITE UA: NEGATIVE

## 2017-05-12 MED ORDER — HYDROXYPROGESTERONE CAPROATE 275 MG/1.1ML ~~LOC~~ SOAJ
275.0000 mg | Freq: Once | SUBCUTANEOUS | Status: AC
  Administered 2017-04-28: 275 mg via SUBCUTANEOUS

## 2017-05-12 MED ORDER — HYDROXYPROGESTERONE CAPROATE 275 MG/1.1ML ~~LOC~~ SOAJ
275.0000 mg | Freq: Once | SUBCUTANEOUS | Status: AC
  Administered 2017-05-05: 275 mg via SUBCUTANEOUS

## 2017-05-12 MED ORDER — HYDROXYPROGESTERONE CAPROATE 275 MG/1.1ML ~~LOC~~ SOAJ
275.0000 mg | Freq: Once | SUBCUTANEOUS | Status: AC
  Administered 2017-05-12: 275 mg via SUBCUTANEOUS

## 2017-05-12 MED ORDER — HYDROXYPROGESTERONE CAPROATE 275 MG/1.1ML ~~LOC~~ SOAJ
275.0000 mg | SUBCUTANEOUS | Status: AC
  Administered 2017-05-19 – 2017-07-07 (×8): 275 mg via SUBCUTANEOUS

## 2017-05-12 NOTE — Addendum Note (Signed)
Addended by: Moss McRESENZO, Tahirih Lair M on: 05/12/2017 12:25 PM   Modules accepted: Orders

## 2017-05-12 NOTE — Progress Notes (Signed)
   LOW-RISK PREGNANCY VISIT Patient name: Kelly Watts MRN [id number]  Date of birth: 1996/03/02 Chief Complaint:   Routine Prenatal Visit (17p)  History of Present Illness:   Kelly Watts is a 22 y.o. 272P0100 female at 6360w1d with an Estimated Date of Delivery: 08/17/17 being seen today for ongoing management of a low-risk pregnancy.  Today she reports pain Rt ribs when standing/sitting too long, goes away when lays down. Hasn't tried anything. No recent cold/coughing.  Contractions: Not present.  .  Movement: Present. denies leaking of fluid. Review of Systems:   Pertinent items are noted in HPI Denies abnormal vaginal discharge w/ itching/odor/irritation, headaches, visual changes, shortness of breath, chest pain, abdominal pain, severe nausea/vomiting, or problems with urination or bowel movements unless otherwise stated above. Pertinent History Reviewed:  Reviewed past medical,surgical, social, obstetrical and family history.  Reviewed problem list, medications and allergies. Physical Assessment:   Vitals:   05/12/17 1152  BP: 100/60  Pulse: 97  Weight: 120 lb 6.4 oz (54.6 kg)  Body mass index is 21.33 kg/m.        Physical Examination:   General appearance: Well appearing, and in no distress  Mental status: Alert, oriented to person, place, and time  Skin: Warm & dry  Ribs: non-tender to palpation, no hepatomegaly  Cardiovascular: Normal heart rate noted  Respiratory: Normal respiratory effort, no distress  Abdomen: Soft, gravid, nontender  Pelvic: Cervical exam deferred         Extremities: Edema: None  Fetal Status: Fetal Heart Rate (bpm): 158 Fundal Height: 24 cm Movement: Present    Results for orders placed or performed in visit on 05/12/17 (from the past 24 hour(s))  POCT urinalysis dipstick   Collection Time: 05/12/17 11:56 AM  Result Value Ref Range   Color, UA     Clarity, UA     Glucose, UA neg    Bilirubin, UA     Ketones, UA neg    Spec Grav, UA   1.010 - 1.025   Blood, UA neg    pH, UA  5.0 - 8.0   Protein, UA trace    Urobilinogen, UA  0.2 or 1.0 E.U./dL   Nitrite, UA neg    Leukocytes, UA Negative Negative   Appearance     Odor      Assessment & Plan:  1) Low-risk pregnancy G2P0100 at 4160w1d with an Estimated Date of Delivery: 08/17/17   2) Rt rib pain, try apap, ice packs  3) H/O 22wk PTB d/t PPROM> continue weekly Makena, received dose today   Meds:  Meds ordered this encounter  Medications  . HYDROXYprogesterone Caproate SOAJ 275 mg   Labs/procedures today: Makena  Plan:  Continue routine obstetrical care   Reviewed: Preterm labor symptoms and general obstetric precautions including but not limited to vaginal bleeding, contractions, leaking of fluid and fetal movement were reviewed in detail with the patient.  All questions were answered  Follow-up: Return in about 2 weeks (around 05/26/2017) for LROB, PN2, Weekly 17P.  Orders Placed This Encounter  Procedures  . POCT urinalysis dipstick   Marge DuncansBooker, Phu Record Randall CNM, [facility]NP[facility]BC 05/12/2017 12:13 PM

## 2017-05-12 NOTE — Patient Instructions (Signed)
Kelly Watts, I greatly value your feedback.  If you receive a survey following your visit with us today, we appreciate you taking the time to fill it out.  Thanks, Kelly Watts, CNM, WHNP-BC   You will have your sugar test next visit.  Please do not eat or drink anything after midnight the night before you come, not even water.  You will be here for at least two hours.     Call the office [phone]) or go to [facility] if:  You begin to have strong, frequent contractions  Your water breaks.  Sometimes it is a big gush of fluid, sometimes it is just a trickle that keeps getting your panties wet or running down your legs  You have vaginal bleeding.  It is normal to have a small amount of spotting if your cervix was checked.   You don't feel your baby moving like normal.  If you don't, get you something to eat and drink and lay down and focus on feeling your baby move.   If your baby is still not moving like normal, you should call the office or go to [facility].  Second Trimester of Pregnancy The second trimester is from week 13 through week 28, months 4 through 6. The second trimester is often a time when you feel your best. Your body has also adjusted to being pregnant, and you begin to feel better physically. Usually, morning sickness has lessened or quit completely, you may have more energy, and you may have an increase in appetite. The second trimester is also a time when the fetus is growing rapidly. At the end of the sixth month, the fetus is about 9 inches long and weighs about 1 pounds. You will likely begin to feel the baby move (quickening) between 18 and 20 weeks of the pregnancy. BODY CHANGES Your body goes through many changes during pregnancy. The changes vary from woman to woman.   Your weight will continue to increase. You will notice your lower abdomen bulging out.  You may begin to get stretch marks on your hips, abdomen, and breasts.  You may develop  headaches that can be relieved by medicines approved by your health care provider.  You may urinate more often because the fetus is pressing on your bladder.  You may develop or continue to have heartburn as a result of your pregnancy.  You may develop constipation because certain hormones are causing the muscles that push waste through your intestines to slow down.  You may develop hemorrhoids or swollen, bulging veins (varicose veins).  You may have back pain because of the weight gain and pregnancy hormones relaxing your joints between the bones in your pelvis and as a result of a shift in weight and the muscles that support your balance.  Your breasts will continue to grow and be tender.  Your gums may bleed and may be sensitive to brushing and flossing.  Dark spots or blotches (chloasma, mask of pregnancy) may develop on your face. This will likely fade after the baby is born.  A dark line from your belly button to the pubic area (linea nigra) may appear. This will likely fade after the baby is born.  You may have changes in your hair. These can include thickening of your hair, rapid growth, and changes in texture. Some women also have hair loss during or after pregnancy, or hair that feels dry or thin. Your hair will most likely return to normal after your  baby is born. WHAT TO EXPECT AT YOUR PRENATAL VISITS During a routine prenatal visit:  You will be weighed to make sure you and the fetus are growing normally.  Your blood pressure will be taken.  Your abdomen will be measured to track your baby's growth.  The fetal heartbeat will be listened to.  Any test results from the previous visit will be discussed. Your health care provider may ask you:  How you are feeling.  If you are feeling the baby move.  If you have had any abnormal symptoms, such as leaking fluid, bleeding, severe headaches, or abdominal cramping.  If you have any questions. Other tests that may be  performed during your second trimester include:  Blood tests that check for:  Low iron levels (anemia).  Gestational diabetes (between 24 and 28 weeks).  Rh antibodies.  Urine tests to check for infections, diabetes, or protein in the urine.  An ultrasound to confirm the proper growth and development of the baby.  An amniocentesis to check for possible genetic problems.  Fetal screens for spina bifida and Down syndrome. HOME CARE INSTRUCTIONS   Avoid all smoking, herbs, alcohol, and unprescribed drugs. These chemicals affect the formation and growth of the baby.  Follow your health care provider's instructions regarding medicine use. There are medicines that are either safe or unsafe to take during pregnancy.  Exercise only as directed by your health care provider. Experiencing uterine cramps is a good sign to stop exercising.  Continue to eat regular, healthy meals.  Wear a good support bra for breast tenderness.  Do not use hot tubs, steam rooms, or saunas.  Wear your seat belt at all times when driving.  Avoid raw meat, uncooked cheese, cat litter boxes, and soil used by cats. These carry germs that can cause birth defects in the baby.  Take your prenatal vitamins.  Try taking a stool softener (if your health care provider approves) if you develop constipation. Eat more high-fiber foods, such as fresh vegetables or fruit and whole grains. Drink plenty of fluids to keep your urine clear or pale yellow.  Take warm sitz baths to soothe any pain or discomfort caused by hemorrhoids. Use hemorrhoid cream if your health care provider approves.  If you develop varicose veins, wear support hose. Elevate your feet for 15 minutes, 3-4 times a day. Limit salt in your diet.  Avoid heavy lifting, wear low heel shoes, and practice good posture.  Rest with your legs elevated if you have leg cramps or low back pain.  Visit your dentist if you have not gone yet during your pregnancy.  Use a soft toothbrush to brush your teeth and be gentle when you floss.  A sexual relationship may be continued unless your health care provider directs you otherwise.  Continue to go to all your prenatal visits as directed by your health care provider. SEEK MEDICAL CARE IF:   You have dizziness.  You have mild pelvic cramps, pelvic pressure, or nagging pain in the abdominal area.  You have persistent nausea, vomiting, or diarrhea.  You have a bad smelling vaginal discharge.  You have pain with urination. SEEK IMMEDIATE MEDICAL CARE IF:   You have a fever.  You are leaking fluid from your vagina.  You have spotting or bleeding from your vagina.  You have severe abdominal cramping or pain.  You have rapid weight gain or loss.  You have shortness of breath with chest pain.  You notice sudden or extreme   swelling of your face, hands, ankles, feet, or legs.  You have not felt your baby move in over an hour.  You have severe headaches that do not go away with medicine.  You have vision changes. Document Released: 04/15/2001 Document Revised: 04/26/2013 Document Reviewed: 06/22/2012 [facility] Patient Information 2015 [place], [place]. This information is not intended to replace advice given to you by your health care provider. Make sure you discuss any questions you have with your health care provider.

## 2017-05-19 ENCOUNTER — Ambulatory Visit (INDEPENDENT_AMBULATORY_CARE_PROVIDER_SITE_OTHER): Payer: 59 | Admitting: *Deleted

## 2017-05-19 VITALS — BP 100/65 | HR 78 | Wt 121.5 lb

## 2017-05-19 DIAGNOSIS — Z3A27 27 weeks gestation of pregnancy: Secondary | ICD-10-CM

## 2017-05-19 DIAGNOSIS — Z1389 Encounter for screening for other disorder: Secondary | ICD-10-CM | POA: Diagnosis not present

## 2017-05-19 DIAGNOSIS — O09212 Supervision of pregnancy with history of pre-term labor, second trimester: Secondary | ICD-10-CM | POA: Diagnosis not present

## 2017-05-19 DIAGNOSIS — Z331 Pregnant state, incidental: Secondary | ICD-10-CM

## 2017-05-19 LAB — POCT URINALYSIS DIPSTICK
GLUCOSE UA: NEGATIVE
Ketones, UA: NEGATIVE
LEUKOCYTES UA: NEGATIVE
NITRITE UA: NEGATIVE
PROTEIN UA: NEGATIVE
RBC UA: NEGATIVE

## 2017-05-19 NOTE — Addendum Note (Signed)
Addended by: Sherre LainASH, AMANDA A on: 05/19/2017 12:05 PM   Modules accepted: Level of Service

## 2017-05-19 NOTE — Progress Notes (Signed)
Pt in for makena injection. Patient tolerated well. Will be back in 1 week for ob visit and injection.

## 2017-05-25 ENCOUNTER — Encounter: Payer: Self-pay | Admitting: Physician Assistant

## 2017-05-25 ENCOUNTER — Ambulatory Visit (INDEPENDENT_AMBULATORY_CARE_PROVIDER_SITE_OTHER): Payer: 59 | Admitting: Physician Assistant

## 2017-05-25 ENCOUNTER — Other Ambulatory Visit: Payer: Self-pay

## 2017-05-25 ENCOUNTER — Ambulatory Visit: Payer: 59 | Admitting: Family Medicine

## 2017-05-25 VITALS — BP 108/74 | HR 104 | Temp 97.9°F | Resp 16 | Wt 120.8 lb

## 2017-05-25 DIAGNOSIS — J988 Other specified respiratory disorders: Secondary | ICD-10-CM

## 2017-05-25 DIAGNOSIS — J029 Acute pharyngitis, unspecified: Secondary | ICD-10-CM

## 2017-05-25 DIAGNOSIS — B9689 Other specified bacterial agents as the cause of diseases classified elsewhere: Secondary | ICD-10-CM

## 2017-05-25 MED ORDER — AZITHROMYCIN 250 MG PO TABS: ORAL_TABLET | ORAL | 0 refills | Status: DC

## 2017-05-25 NOTE — Progress Notes (Signed)
Patient ID: Kelly Watts MRN: [id number], DOB: March 09, 1996, 22 y.o. Date of Encounter: 05/25/2017, 10:26 AM    Chief Complaint:  Chief Complaint  Patient presents with  . Cough  . Sore Throat    x5days     HPI: 22 y.o. year old female presents with above.   Says that she has been sick for at least 5 days now and it is not getting any better at all.   States that she has had a bad sore throat and a very congested cough.  Some nasal congestion and mucus from the nose.  Has had no fever.  Has checked her temperature some with a thermometer and has been getting normal readings.    She is pregnant.  Has had no other additional signs or symptoms.  No other concerns to address today.     Home Meds:   Outpatient Medications Prior to Visit  Medication Sig Dispense Refill  . Prenatal Vit-Fe Fumarate-FA (PRENATAL VITAMIN PO) Take by mouth daily.     Facility-Administered Medications Prior to Visit  Medication Dose Route Frequency Provider Last Rate Last Dose  . HYDROXYprogesterone Caproate SOAJ 275 mg  275 mg Subcutaneous Weekly Cheral MarkerBooker, Kimberly R, [place]   275 mg at 05/19/17 1147    Allergies:  Allergies  Allergen Reactions  . Penicillins Hives  . Sulfa Antibiotics       Review of Systems: See HPI for pertinent ROS. All other ROS negative.    Physical Exam: Blood pressure 108/74, pulse (!) 104, temperature 97.9 F (36.6 C), temperature source Oral, resp. rate 16, weight 54.8 kg (120 lb 12.8 oz), last menstrual period 11/10/2016, SpO2 99 %., Body mass index is 21.4 kg/m. General:  WF. Appears in no acute distress. HEENT: Normocephalic, atraumatic, eyes without discharge, sclera non-icteric, nares are without discharge. Bilateral auditory canals clear. Right TM with effusion, clear, serous. Left Tm clear, normal.  Oral cavity moist, posterior pharynx with mild erythema, no  exudate, no peritonsillar abscess. Neck: Supple. No thyromegaly. No lymphadenopathy. Lungs: Clear  bilaterally to auscultation without wheezes, rales, or rhonchi. Breathing is unlabored. Heart: Regular rhythm. No murmurs, rubs, or gallops. Msk:  Strength and tone normal for age. Extremities/Skin: Warm and dry.  Neuro: Alert and oriented X 3. Moves all extremities spontaneously. Gait is normal. CNII-XII grossly in tact. Psych:  Responds to questions appropriately with a normal affect.   Results for orders placed or performed in visit on 05/25/17  STREP GROUP A AG, W/REFLEX TO CULT  Result Value Ref Range   Streptococcus, Group A Screen (Direct) NONE DETECTED      ASSESSMENT AND PLAN:  22 y.o. year old female with  1. Bacterial respiratory infection  She is allergic to penicillin and sulfa. She is pregnant.  Azithromycin is category B pregnancy. She is to take the azithromycin as directed.  Discussed that most over-the-counter medications are not safe to use during pregnancy.  She states that her OB has given her a list of over-the-counter medicines that she can with pregnancy so she will follow this regarding any additional meds for symptom relief.  Follow-up if symptoms do not resolve after completion of antibiotic.  I offered note for work but states does not need one. - azithromycin (ZITHROMAX) 250 MG tablet; Day 1: Take 2 daily.  Days 2 -5: Take 1 daily.  Dispense: 6 tablet; Refill: 0  2. Sore throat Rapid strep test negative. - STREP GROUP A AG, W/REFLEX TO CULT   Signed,  [address] [place], [place], [facility] 05/25/2017 10:26 AM

## 2017-05-26 ENCOUNTER — Other Ambulatory Visit: Payer: Self-pay

## 2017-05-26 ENCOUNTER — Ambulatory Visit (INDEPENDENT_AMBULATORY_CARE_PROVIDER_SITE_OTHER): Payer: 59 | Admitting: Women's Health

## 2017-05-26 ENCOUNTER — Encounter: Payer: Self-pay | Admitting: Women's Health

## 2017-05-26 ENCOUNTER — Other Ambulatory Visit: Payer: 59

## 2017-05-26 VITALS — BP 102/60 | HR 78 | Wt 120.0 lb

## 2017-05-26 DIAGNOSIS — Z131 Encounter for screening for diabetes mellitus: Secondary | ICD-10-CM

## 2017-05-26 DIAGNOSIS — Z3483 Encounter for supervision of other normal pregnancy, third trimester: Secondary | ICD-10-CM

## 2017-05-26 DIAGNOSIS — Z3A28 28 weeks gestation of pregnancy: Secondary | ICD-10-CM

## 2017-05-26 DIAGNOSIS — O09219 Supervision of pregnancy with history of pre-term labor, unspecified trimester: Secondary | ICD-10-CM

## 2017-05-26 DIAGNOSIS — Z1389 Encounter for screening for other disorder: Secondary | ICD-10-CM

## 2017-05-26 DIAGNOSIS — O09213 Supervision of pregnancy with history of pre-term labor, third trimester: Secondary | ICD-10-CM | POA: Diagnosis not present

## 2017-05-26 DIAGNOSIS — Z3482 Encounter for supervision of other normal pregnancy, second trimester: Secondary | ICD-10-CM | POA: Diagnosis not present

## 2017-05-26 DIAGNOSIS — Z331 Pregnant state, incidental: Secondary | ICD-10-CM

## 2017-05-26 DIAGNOSIS — O09899 Supervision of other high risk pregnancies, unspecified trimester: Secondary | ICD-10-CM

## 2017-05-26 LAB — POCT URINALYSIS DIPSTICK
Blood, UA: NEGATIVE
Glucose, UA: NEGATIVE
Ketones, UA: NEGATIVE
LEUKOCYTES UA: NEGATIVE
NITRITE UA: NEGATIVE
Protein, UA: NEGATIVE

## 2017-05-26 NOTE — Progress Notes (Signed)
Pt given Makena 275mg SQ right arm without complications. Advised to return in 1 week for next injection. 

## 2017-05-26 NOTE — Progress Notes (Signed)
   LOW-RISK PREGNANCY VISIT Patient name: Kelly Watts Bou MRN [id number]  Date of birth: 1995/10/21 Chief Complaint:   Routine Prenatal Visit (PN2 today)  History of Present Illness:   Kelly Watts Menard is a 22 y.o. 162P0100 female at 7981w1d with an Estimated Date of Delivery: 08/17/17 being seen today for ongoing management of a low-risk pregnancy.  Today she reports no complaints. Contractions: Not present. Vag. Bleeding: None.  Movement: Present. denies leaking of fluid. Review of Systems:   Pertinent items are noted in HPI Denies abnormal vaginal discharge w/ itching/odor/irritation, headaches, visual changes, shortness of breath, chest pain, abdominal pain, severe nausea/vomiting, or problems with urination or bowel movements unless otherwise stated above. Pertinent History Reviewed:  Reviewed past medical,surgical, social, obstetrical and family history.  Reviewed problem list, medications and allergies. Physical Assessment:   Vitals:   05/26/17 0924  BP: 102/60  Pulse: 78  Weight: 120 lb (54.4 kg)  Body mass index is 21.26 kg/m.        Physical Examination:   General appearance: Well appearing, and in no distress  Mental status: Alert, oriented to person, place, and time  Skin: Warm & dry  Cardiovascular: Normal heart rate noted  Respiratory: Normal respiratory effort, no distress  Abdomen: Soft, gravid, nontender  Pelvic: Cervical exam deferred         Extremities: Edema: None  Fetal Status: Fetal Heart Rate (bpm): 144 Fundal Height: 27 cm Movement: Present    Results for orders placed or performed in visit on 05/26/17 (from the past 24 hour(s))  POCT urinalysis dipstick   Collection Time: 05/26/17  9:26 AM  Result Value Ref Range   Color, UA     Clarity, UA     Glucose, UA neg    Bilirubin, UA     Ketones, UA neg    Spec Grav, UA  1.010 - 1.025   Blood, UA neg    pH, UA  5.0 - 8.0   Protein, UA neg    Urobilinogen, UA  0.2 or 1.0 E.U./dL   Nitrite, UA neg    Leukocytes, UA Negative Negative   Appearance     Odor    Results for orders placed or performed in visit on 05/25/17 (from the past 24 hour(s))  STREP GROUP A AG, W/REFLEX TO CULT   Collection Time: 05/25/17 10:17 AM  Result Value Ref Range   Streptococcus, Group A Screen (Direct) NONE DETECTED     Assessment & Plan:  1) Low-risk pregnancy G2P0100 at 8881w1d with an Estimated Date of Delivery: 08/17/17   2) H/o 22wk PTB d/t PPROM w/ prolapsed cord>IUFD, continue weekly Makena, doesn't need antenatal testing per [facility]   Meds: No orders of the defined types were placed in this encounter.  Labs/procedures today: pn2  Plan:  Continue routine obstetrical care   Reviewed: Preterm labor symptoms and general obstetric precautions including but not limited to vaginal bleeding, contractions, leaking of fluid and fetal movement were reviewed in detail with the patient. Recommended Tdap at HD/PCP per CDC guidelines.  All questions were answered  Follow-up: Return in about 4 weeks (around 06/23/2017) for LROB, Weekly 17P.  Orders Placed This Encounter  Procedures  . POCT urinalysis dipstick   Marge DuncansBooker, Halena Mohar Randall CNM, [facility]NP-BC 05/26/2017 9:51 AM

## 2017-05-26 NOTE — Patient Instructions (Signed)
Kelly Watts, I greatly value your feedback.  If you receive a survey following your visit with us today, we appreciate you taking the time to fill it out.  Thanks, Joellyn HaffKim Cobie Leidner, CNM, WHNP-BC   Call the office [phone]) or go to [facility] if:  You begin to have strong, frequent contractions  Your water breaks.  Sometimes it is a big gush of fluid, sometimes it is just a trickle that keeps getting your panties wet or running down your legs  You have vaginal bleeding.  It is normal to have a small amount of spotting if your cervix was checked.   You don't feel your baby moving like normal.  If you don't, get you something to eat and drink and lay down and focus on feeling your baby move.  You should feel at least 10 movements in 2 hours.  If you don't, you should call the office or go to [facility].    Tdap Vaccine  It is recommended that you get the Tdap vaccine during the third trimester of EACH pregnancy to help protect your baby from getting pertussis (whooping cough)  27-36 weeks is the BEST time to do this so that you can pass the protection on to your baby. During pregnancy is better than after pregnancy, but if you are unable to get it during pregnancy it will be offered at the hospital.   You can get this vaccine at the health department or your family doctor  Everyone who will be around your baby should also be up-to-date on their vaccines. Adults (who are not pregnant) only need 1 dose of Tdap during adulthood.   Third Trimester of Pregnancy The third trimester is from week 29 through week 42, months 7 through 9. The third trimester is a time when the fetus is growing rapidly. At the end of the ninth month, the fetus is about 20 inches in length and weighs 6-10 pounds.  BODY CHANGES Your body goes through many changes during pregnancy. The changes vary from woman to woman.   Your weight will continue to increase. You can expect to gain 25-35 pounds (11-16 kg) by  the end of the pregnancy.  You may begin to get stretch marks on your hips, abdomen, and breasts.  You may urinate more often because the fetus is moving lower into your pelvis and pressing on your bladder.  You may develop or continue to have heartburn as a result of your pregnancy.  You may develop constipation because certain hormones are causing the muscles that push waste through your intestines to slow down.  You may develop hemorrhoids or swollen, bulging veins (varicose veins).  You may have pelvic pain because of the weight gain and pregnancy hormones relaxing your joints between the bones in your pelvis. Backaches may result from overexertion of the muscles supporting your posture.  You may have changes in your hair. These can include thickening of your hair, rapid growth, and changes in texture. Some women also have hair loss during or after pregnancy, or hair that feels dry or thin. Your hair will most likely return to normal after your baby is born.  Your breasts will continue to grow and be tender. A yellow discharge may leak from your breasts called colostrum.  Your belly button may stick out.  You may feel short of breath because of your expanding uterus.  You may notice the fetus "dropping," or moving lower in your abdomen.  You may have a bloody  mucus discharge. This usually occurs a few days to a week before labor begins.  Your cervix becomes thin and soft (effaced) near your due date. WHAT TO EXPECT AT YOUR PRENATAL EXAMS  You will have prenatal exams every 2 weeks until week 36. Then, you will have weekly prenatal exams. During a routine prenatal visit:  You will be weighed to make sure you and the fetus are growing normally.  Your blood pressure is taken.  Your abdomen will be measured to track your baby's growth.  The fetal heartbeat will be listened to.  Any test results from the previous visit will be discussed.  You may have a cervical check near your  due date to see if you have effaced. At around 36 weeks, your caregiver will check your cervix. At the same time, your caregiver will also perform a test on the secretions of the vaginal tissue. This test is to determine if a type of bacteria, Group B streptococcus, is present. Your caregiver will explain this further. Your caregiver may ask you:  What your birth plan is.  How you are feeling.  If you are feeling the baby move.  If you have had any abnormal symptoms, such as leaking fluid, bleeding, severe headaches, or abdominal cramping.  If you have any questions. Other tests or screenings that may be performed during your third trimester include:  Blood tests that check for low iron levels (anemia).  Fetal testing to check the health, activity level, and growth of the fetus. Testing is done if you have certain medical conditions or if there are problems during the pregnancy. FALSE LABOR You may feel small, irregular contractions that eventually go away. These are called Braxton Hicks contractions, or false labor. Contractions may last for hours, days, or even weeks before true labor sets in. If contractions come at regular intervals, intensify, or become painful, it is best to be seen by your caregiver.  SIGNS OF LABOR   Menstrual-like cramps.  Contractions that are 5 minutes apart or less.  Contractions that start on the top of the uterus and spread down to the lower abdomen and back.  A sense of increased pelvic pressure or back pain.  A watery or bloody mucus discharge that comes from the vagina. If you have any of these signs before the 37th week of pregnancy, call your caregiver right away. You need to go to the hospital to get checked immediately. HOME CARE INSTRUCTIONS   Avoid all smoking, herbs, alcohol, and unprescribed drugs. These chemicals affect the formation and growth of the baby.  Follow your caregiver's instructions regarding medicine use. There are medicines  that are either safe or unsafe to take during pregnancy.  Exercise only as directed by your caregiver. Experiencing uterine cramps is a good sign to stop exercising.  Continue to eat regular, healthy meals.  Wear a good support bra for breast tenderness.  Do not use hot tubs, steam rooms, or saunas.  Wear your seat belt at all times when driving.  Avoid raw meat, uncooked cheese, cat litter boxes, and soil used by cats. These carry germs that can cause birth defects in the baby.  Take your prenatal vitamins.  Try taking a stool softener (if your caregiver approves) if you develop constipation. Eat more high-fiber foods, such as fresh vegetables or fruit and whole grains. Drink plenty of fluids to keep your urine clear or pale yellow.  Take warm sitz baths to soothe any pain or discomfort caused by hemorrhoids.  Use hemorrhoid cream if your caregiver approves.  If you develop varicose veins, wear support hose. Elevate your feet for 15 minutes, 3-4 times a day. Limit salt in your diet.  Avoid heavy lifting, wear low heal shoes, and practice good posture.  Rest a lot with your legs elevated if you have leg cramps or low back pain.  Visit your dentist if you have not gone during your pregnancy. Use a soft toothbrush to brush your teeth and be gentle when you floss.  A sexual relationship may be continued unless your caregiver directs you otherwise.  Do not travel far distances unless it is absolutely necessary and only with the approval of your caregiver.  Take prenatal classes to understand, practice, and ask questions about the labor and delivery.  Make a trial run to the hospital.  Pack your hospital bag.  Prepare the baby's nursery.  Continue to go to all your prenatal visits as directed by your caregiver. SEEK MEDICAL CARE IF:  You are unsure if you are in labor or if your water has broken.  You have dizziness.  You have mild pelvic cramps, pelvic pressure, or nagging  pain in your abdominal area.  You have persistent nausea, vomiting, or diarrhea.  You have a bad smelling vaginal discharge.  You have pain with urination. SEEK IMMEDIATE MEDICAL CARE IF:   You have a fever.  You are leaking fluid from your vagina.  You have spotting or bleeding from your vagina.  You have severe abdominal cramping or pain.  You have rapid weight loss or gain.  You have shortness of breath with chest pain.  You notice sudden or extreme swelling of your face, hands, ankles, feet, or legs.  You have not felt your baby move in over an hour.  You have severe headaches that do not go away with medicine.  You have vision changes. Document Released: 04/15/2001 Document Revised: 04/26/2013 Document Reviewed: 06/22/2012 [facility] Patient Information 2015 [place]. This information is not intended to replace advice given to you by your health care provider. Make sure you discuss any questions you have with your health care provider.

## 2017-05-27 LAB — CULTURE, GROUP A STREP
MICRO NUMBER:: 90085179
SOURCE:: 0
SPECIMEN QUALITY:: ADEQUATE

## 2017-05-27 LAB — ANTIBODY SCREEN: ANTIBODY SCREEN: NEGATIVE

## 2017-05-27 LAB — GLUCOSE TOLERANCE, 2 HOURS W/ 1HR
GLUCOSE, 1 HOUR: 101 mg/dL (ref 65–179)
GLUCOSE, 2 HOUR: 108 mg/dL (ref 65–152)
GLUCOSE, FASTING: 75 mg/dL (ref 65–91)

## 2017-05-27 LAB — CBC
Hematocrit: 32.7 % — ABNORMAL LOW (ref 34.0–46.6)
Hemoglobin: 11.3 g/dL (ref 11.1–15.9)
MCH: 31.7 pg (ref 26.6–33.0)
MCHC: 34.6 g/dL (ref 31.5–35.7)
MCV: 92 fL (ref 79–97)
PLATELETS: 221 10*3/uL (ref 150–379)
RBC: 3.56 x10E6/uL — AB (ref 3.77–5.28)
RDW: 13.2 % (ref 12.3–15.4)
WBC: 7.6 10*3/uL (ref 3.4–10.8)

## 2017-05-27 LAB — STREP GROUP A AG, W/REFLEX TO CULT: Streptococcus, Group A Screen (Direct): NOT DETECTED

## 2017-05-27 LAB — RPR: RPR: NONREACTIVE

## 2017-05-27 LAB — HIV ANTIBODY (ROUTINE TESTING W REFLEX): HIV SCREEN 4TH GENERATION: NONREACTIVE

## 2017-06-02 ENCOUNTER — Ambulatory Visit (INDEPENDENT_AMBULATORY_CARE_PROVIDER_SITE_OTHER): Payer: 59

## 2017-06-02 VITALS — BP 100/60 | HR 94 | Wt 120.0 lb

## 2017-06-02 DIAGNOSIS — Z331 Pregnant state, incidental: Secondary | ICD-10-CM | POA: Diagnosis not present

## 2017-06-02 DIAGNOSIS — O09213 Supervision of pregnancy with history of pre-term labor, third trimester: Secondary | ICD-10-CM | POA: Diagnosis not present

## 2017-06-02 DIAGNOSIS — Z1389 Encounter for screening for other disorder: Secondary | ICD-10-CM | POA: Diagnosis not present

## 2017-06-02 DIAGNOSIS — Z8751 Personal history of pre-term labor: Secondary | ICD-10-CM

## 2017-06-02 DIAGNOSIS — Z3A29 29 weeks gestation of pregnancy: Secondary | ICD-10-CM

## 2017-06-02 DIAGNOSIS — Z3483 Encounter for supervision of other normal pregnancy, third trimester: Secondary | ICD-10-CM

## 2017-06-02 LAB — POCT URINALYSIS DIPSTICK
GLUCOSE UA: NEGATIVE
KETONES UA: NEGATIVE
Leukocytes, UA: NEGATIVE
Nitrite, UA: NEGATIVE
RBC UA: NEGATIVE

## 2017-06-02 NOTE — Progress Notes (Signed)
PT here for 17p injection.given IM SQ rt arm. Tolerated well. Return 1 week for next injection.pad CMA

## 2017-06-03 ENCOUNTER — Telehealth: Payer: Self-pay | Admitting: Obstetrics & Gynecology

## 2017-06-03 MED FILL — MAKENA 275 MG/1.1ML SOAJ: 275 | 28 days supply | Qty: 4 | Fill #1

## 2017-06-03 NOTE — Telephone Encounter (Signed)
Spoke with pt. Pt states she is having pelvic pain. No bleeding, no leaking fluid. + baby movement. I advised to watch it and see if it goes away. Advised to try and rest when she notices discomfort and push fluids. If continues, call us back for appt. Pt would feel better if she was seen. Call transferred to front desk for appt. [facility]. Pt can pick up tomorrow after lunch. Pt aware. JSY

## 2017-06-04 ENCOUNTER — Encounter: Payer: Self-pay | Admitting: Advanced Practice Midwife

## 2017-06-04 ENCOUNTER — Ambulatory Visit (HOSPITAL_COMMUNITY)
Admission: RE | Admit: 2017-06-04 | Discharge: 2017-06-04 | Disposition: A | Discharge status: Home / Self Care | Payer: 59 | Source: Ambulatory Visit | Attending: Advanced Practice Midwife | Admitting: Advanced Practice Midwife

## 2017-06-04 ENCOUNTER — Ambulatory Visit (INDEPENDENT_AMBULATORY_CARE_PROVIDER_SITE_OTHER): Payer: 59 | Admitting: Advanced Practice Midwife

## 2017-06-04 ENCOUNTER — Other Ambulatory Visit (HOSPITAL_COMMUNITY): Payer: Self-pay | Admitting: Advanced Practice Midwife

## 2017-06-04 VITALS — BP 100/60 | HR 98 | Wt 122.0 lb

## 2017-06-04 DIAGNOSIS — Z1389 Encounter for screening for other disorder: Secondary | ICD-10-CM

## 2017-06-04 DIAGNOSIS — Z3A29 29 weeks gestation of pregnancy: Secondary | ICD-10-CM | POA: Diagnosis not present

## 2017-06-04 DIAGNOSIS — O9989 Other specified diseases and conditions complicating pregnancy, childbirth and the puerperium: Secondary | ICD-10-CM | POA: Insufficient documentation

## 2017-06-04 DIAGNOSIS — N1330 Unspecified hydronephrosis: Secondary | ICD-10-CM | POA: Diagnosis not present

## 2017-06-04 DIAGNOSIS — R102 Pelvic and perineal pain: Secondary | ICD-10-CM

## 2017-06-04 DIAGNOSIS — O26893 Other specified pregnancy related conditions, third trimester: Secondary | ICD-10-CM | POA: Insufficient documentation

## 2017-06-04 DIAGNOSIS — R109 Unspecified abdominal pain: Secondary | ICD-10-CM

## 2017-06-04 DIAGNOSIS — Z331 Pregnant state, incidental: Secondary | ICD-10-CM

## 2017-06-04 DIAGNOSIS — Z3483 Encounter for supervision of other normal pregnancy, third trimester: Secondary | ICD-10-CM

## 2017-06-04 LAB — POCT URINALYSIS DIPSTICK
GLUCOSE UA: NEGATIVE
Ketones, UA: NEGATIVE
Nitrite, UA: NEGATIVE
Protein, UA: NEGATIVE

## 2017-06-04 MED ORDER — TAMSULOSIN HCL 0.4 MG PO CAPS: 0.4000 mg | ORAL_CAPSULE | Freq: Every day | ORAL | 0 refills | Status: DC

## 2017-06-04 MED ORDER — TRAMADOL HCL 50 MG PO TABS: 50.0000 mg | ORAL_TABLET | Freq: Four times a day (QID) | ORAL | 0 refills | Status: DC | PRN

## 2017-06-04 NOTE — Progress Notes (Signed)
WORK IN FOR PELVIC PAIN since yesterday.  Pain is no longer in pelvis, but in left flank. no vaginal discharge/irritation/itching.    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   BP weight and urine results all reviewed and noted.  Please refer to the obstetrical flow sheet for the fundal height and fetal heart rate documentation:  Patient reports good fetal movement, denies any bleeding and no rupture of membranes symptoms or regular contractions.    Cx FT/long/ballottable  All questions were answered.  Orders Placed This Encounter  Procedures  . US RENAL  . Fetal fibronectin  . POCT urinalysis dipstick     FINDINGS: Right Kidney:  Length: 12.7 cm. Echogenicity within normal limits. No mass. Mild hydronephrosis.  Left Kidney:  Length: 10.6 cm. Echogenicity within normal limits. No mass. Mild hydronephrosis.  Bladder:  Appears normal for degree of bladder distention. Bilateral ureteral jets noted.  IMPRESSION: Mild bilateral hydronephrosis.  Exam otherwise unremarkable.   Electronically Signed   By: Maisie Fushomas  Register   On: 06/04/2017 11:01 Plan:  Continued routine obstetrical care, renal US ordered. Flomax/tramadol rx'd. 2 fFn collected and sent

## 2017-06-05 LAB — FETAL FIBRONECTIN: Fetal Fibronectin: NEGATIVE

## 2017-06-09 ENCOUNTER — Encounter: Payer: Self-pay | Admitting: *Deleted

## 2017-06-09 ENCOUNTER — Ambulatory Visit (INDEPENDENT_AMBULATORY_CARE_PROVIDER_SITE_OTHER): Payer: 59 | Admitting: *Deleted

## 2017-06-09 VITALS — BP 102/60 | HR 94 | Ht 63.0 in | Wt 122.0 lb

## 2017-06-09 DIAGNOSIS — Z1389 Encounter for screening for other disorder: Secondary | ICD-10-CM

## 2017-06-09 DIAGNOSIS — Z331 Pregnant state, incidental: Secondary | ICD-10-CM | POA: Diagnosis not present

## 2017-06-09 DIAGNOSIS — Z3A29 29 weeks gestation of pregnancy: Secondary | ICD-10-CM | POA: Diagnosis not present

## 2017-06-09 DIAGNOSIS — O09893 Supervision of other high risk pregnancies, third trimester: Secondary | ICD-10-CM

## 2017-06-09 DIAGNOSIS — O09213 Supervision of pregnancy with history of pre-term labor, third trimester: Secondary | ICD-10-CM

## 2017-06-09 LAB — POCT URINALYSIS DIPSTICK
Glucose, UA: NEGATIVE
KETONES UA: NEGATIVE
Leukocytes, UA: NEGATIVE
Nitrite, UA: NEGATIVE
Protein, UA: NEGATIVE
RBC UA: NEGATIVE

## 2017-06-09 NOTE — Progress Notes (Signed)
Pt here for 17P. Pt tolerated shot well. Return in 1 week for next shot. JSY 

## 2017-06-16 ENCOUNTER — Ambulatory Visit (INDEPENDENT_AMBULATORY_CARE_PROVIDER_SITE_OTHER): Payer: 59 | Admitting: *Deleted

## 2017-06-16 ENCOUNTER — Encounter: Payer: Self-pay | Admitting: *Deleted

## 2017-06-16 VITALS — BP 100/50 | HR 98 | Ht 63.0 in | Wt 124.0 lb

## 2017-06-16 DIAGNOSIS — O360130 Maternal care for anti-D [Rh] antibodies, third trimester, not applicable or unspecified: Secondary | ICD-10-CM | POA: Diagnosis not present

## 2017-06-16 DIAGNOSIS — Z1389 Encounter for screening for other disorder: Secondary | ICD-10-CM

## 2017-06-16 DIAGNOSIS — Z3A30 30 weeks gestation of pregnancy: Secondary | ICD-10-CM

## 2017-06-16 DIAGNOSIS — O09893 Supervision of other high risk pregnancies, third trimester: Secondary | ICD-10-CM

## 2017-06-16 DIAGNOSIS — O26893 Other specified pregnancy related conditions, third trimester: Secondary | ICD-10-CM

## 2017-06-16 DIAGNOSIS — Z6791 Unspecified blood type, Rh negative: Secondary | ICD-10-CM

## 2017-06-16 DIAGNOSIS — O09213 Supervision of pregnancy with history of pre-term labor, third trimester: Secondary | ICD-10-CM

## 2017-06-16 DIAGNOSIS — Z331 Pregnant state, incidental: Secondary | ICD-10-CM

## 2017-06-16 LAB — POCT URINALYSIS DIPSTICK
Blood, UA: NEGATIVE
GLUCOSE UA: NEGATIVE
Ketones, UA: NEGATIVE
Nitrite, UA: NEGATIVE
PROTEIN UA: NEGATIVE

## 2017-06-16 MED ORDER — RHO D IMMUNE GLOBULIN 1500 UNIT/2ML IJ SOSY
300.0000 ug | PREFILLED_SYRINGE | Freq: Once | INTRAMUSCULAR | Status: AC
  Administered 2017-06-16: 300 ug via INTRAMUSCULAR

## 2017-06-16 NOTE — Progress Notes (Signed)
Pt received 17P and Rhogam. Pt tolerated both shots well. Return in 1 week for next 17P. JSY

## 2017-06-23 ENCOUNTER — Ambulatory Visit (INDEPENDENT_AMBULATORY_CARE_PROVIDER_SITE_OTHER): Payer: 59 | Admitting: Women's Health

## 2017-06-23 ENCOUNTER — Encounter: Payer: Self-pay | Admitting: Women's Health

## 2017-06-23 VITALS — BP 100/54 | HR 96 | Wt 123.8 lb

## 2017-06-23 DIAGNOSIS — Z331 Pregnant state, incidental: Secondary | ICD-10-CM

## 2017-06-23 DIAGNOSIS — Z3483 Encounter for supervision of other normal pregnancy, third trimester: Secondary | ICD-10-CM

## 2017-06-23 DIAGNOSIS — Z3A32 32 weeks gestation of pregnancy: Secondary | ICD-10-CM

## 2017-06-23 DIAGNOSIS — Z1389 Encounter for screening for other disorder: Secondary | ICD-10-CM

## 2017-06-23 DIAGNOSIS — O09293 Supervision of pregnancy with other poor reproductive or obstetric history, third trimester: Secondary | ICD-10-CM

## 2017-06-23 LAB — POCT URINALYSIS DIPSTICK
GLUCOSE UA: NEGATIVE
Nitrite, UA: NEGATIVE
RBC UA: NEGATIVE

## 2017-06-23 NOTE — Progress Notes (Signed)
   LOW-RISK PREGNANCY VISIT Patient name: Kelly Watts MRN [id number]  Date of birth: 1995/12/28 Chief Complaint:   High Risk Gestation (17P)  History of Present Illness:   Kelly Watts is a 22 y.o. 662P0100 female at 1966w1d with an Estimated Date of Delivery: 08/17/17 being seen today for ongoing management of a low-risk pregnancy.  Today she reports no complaints. Contractions: Not present. Vag. Bleeding: None.  Movement: Present. denies leaking of fluid. Review of Systems:   Pertinent items are noted in HPI Denies abnormal vaginal discharge w/ itching/odor/irritation, headaches, visual changes, shortness of breath, chest pain, abdominal pain, severe nausea/vomiting, or problems with urination or bowel movements unless otherwise stated above. Pertinent History Reviewed:  Reviewed past medical,surgical, social, obstetrical and family history.  Reviewed problem list, medications and allergies. Physical Assessment:   Vitals:   06/23/17 1146  BP: (!) 100/54  Pulse: 96  Weight: 123 lb 12.8 oz (56.2 kg)  Body mass index is 21.93 kg/m.        Physical Examination:   General appearance: Well appearing, and in no distress  Mental status: Alert, oriented to person, place, and time  Skin: Warm & dry  Cardiovascular: Normal heart rate noted  Respiratory: Normal respiratory effort, no distress  Abdomen: Soft, gravid, nontender  Pelvic: Cervical exam deferred         Extremities: Edema: None  Fetal Status: Fetal Heart Rate (bpm): 145 Fundal Height: 32 cm Movement: Present    Results for orders placed or performed in visit on 06/23/17 (from the past 24 hour(s))  POCT Urinalysis Dipstick   Collection Time: 06/23/17 11:49 AM  Result Value Ref Range   Color, UA     Clarity, UA     Glucose, UA neg    Bilirubin, UA     Ketones, UA 4+    Spec Grav, UA  1.010 - 1.025   Blood, UA neg    pH, UA  5.0 - 8.0   Protein, UA trace    Urobilinogen, UA  0.2 or 1.0 E.U./dL   Nitrite, UA neg      Leukocytes, UA Trace (A) Negative   Appearance     Odor      Assessment & Plan:  1) Low-risk pregnancy G2P0100 at 8866w1d with an Estimated Date of Delivery: 08/17/17   2) H/O 22wk PTB d/t PPROM, continue weekly 17P   Meds: No orders of the defined types were placed in this encounter.  Labs/procedures today: 17P  Plan:  Continue routine obstetrical care   Reviewed: Preterm labor symptoms and general obstetric precautions including but not limited to vaginal bleeding, contractions, leaking of fluid and fetal movement were reviewed in detail with the patient.  All questions were answered  Follow-up: Return in about 2 weeks (around 07/07/2017) for LROB, Weekly 17P.  Orders Placed This Encounter  Procedures  . POCT Urinalysis Dipstick   Cheral MarkerKimberly R Booker CNM, WHNP-BC 06/23/2017 1:10 PM

## 2017-06-23 NOTE — Patient Instructions (Addendum)
Carney Harderourtney J Soderberg, I greatly value your feedback.  If you receive a survey following your visit with us today, we appreciate you taking the time to fill it out.  Thanks, Joellyn HaffKim Kennadi [place], CNM, WHNP-BC   Call the office [phone]) or go to [facility] if:  You begin to have strong, frequent contractions  Your water breaks.  Sometimes it is a big gush of fluid, sometimes it is just a trickle that keeps getting your panties wet or running down your legs  You have vaginal bleeding.  It is normal to have a small amount of spotting if your cervix was checked.   You don't feel your baby moving like normal.  If you don't, get you something to eat and drink and lay down and focus on feeling your baby move.  You should feel at least 10 movements in 2 hours.  If you don't, you should call the office or [facility] to [facility].     Preterm Labor and Birth Information The normal length of a pregnancy is 39-41 weeks. Preterm labor is when labor starts before 37 completed weeks of pregnancy. What are the risk factors for preterm labor? Preterm labor is more likely to occur in women who:  Have certain infections during pregnancy such as a bladder infection, sexually transmitted infection, or infection inside the uterus (chorioamnionitis).  Have a shorter-than-normal cervix.  Have gone into preterm labor before.  Have had surgery on their cervix.  Are younger than age 22 or older than age 22.  Are African American.  Are pregnant with twins or multiple babies (multiple gestation).  Take street drugs or smoke while pregnant.  Do not gain enough weight while pregnant.  Became pregnant shortly after having been pregnant.  What are the symptoms of preterm labor? Symptoms of preterm labor include:  Cramps similar to those that can happen during a menstrual period. The cramps may happen with diarrhea.  Pain in the abdomen or lower back.  Regular uterine contractions that may feel like tightening  of the abdomen.  A feeling of increased pressure in the pelvis.  Increased watery or bloody mucus discharge from the vagina.  Water breaking (ruptured amniotic sac).  Why is it important to recognize signs of preterm labor? It is important to recognize signs of preterm labor because babies who are born prematurely may not be fully developed. This can put them at an increased risk for:  Long-term (chronic) heart and lung problems.  Difficulty immediately after birth with regulating body systems, including blood sugar, body temperature, heart rate, and breathing rate.  Bleeding in the brain.  Cerebral palsy.  Learning difficulties.  Death.  These risks are highest for babies who are born before 34 weeks of pregnancy. How is preterm labor treated? Treatment depends on the length of your pregnancy, your condition, and the health of your baby. It may involve:  Having a stitch (suture) placed in your cervix to prevent your cervix from opening too early (cerclage).  Taking or being given medicines, such as: ? Hormone medicines. These may be given early in pregnancy to help support the pregnancy. ? Medicine to stop contractions. ? Medicines to help mature the baby's lungs. These may be prescribed if the risk of delivery is high. ? Medicines to prevent your baby from developing cerebral palsy.  If the labor happens before 34 weeks of pregnancy, you may need to stay in the hospital. What should I do if I think I am in preterm labor? If  you think that you are going into preterm labor, call your health care provider right away. How can I prevent preterm labor in future pregnancies? To increase your chance of having a full-term pregnancy:  Do not use any tobacco products, such as cigarettes, chewing tobacco, and e-cigarettes. If you need help quitting, ask your health care provider.  Do not use street drugs or medicines that have not been prescribed to you during your pregnancy.  Talk  with your health care provider before taking any herbal supplements, even if you have been taking them regularly.  Make sure you gain a healthy amount of weight during your pregnancy.  Watch for infection. If you think that you might have an infection, get it checked right away.  Make sure to tell your health care provider if you have gone into preterm labor before.  This information is not intended to replace advice given to you by your health care provider. Make sure you discuss any questions you have with your health care provider. Document Released: 07/12/2003 Document Revised: 10/02/2015 Document Reviewed: 09/12/2015 Elsevier Interactive Patient Education  2018 [employer].

## 2017-06-30 ENCOUNTER — Ambulatory Visit (INDEPENDENT_AMBULATORY_CARE_PROVIDER_SITE_OTHER): Payer: 59 | Admitting: *Deleted

## 2017-06-30 VITALS — BP 104/58 | HR 84 | Wt 125.0 lb

## 2017-06-30 DIAGNOSIS — Z331 Pregnant state, incidental: Secondary | ICD-10-CM

## 2017-06-30 DIAGNOSIS — Z3A33 33 weeks gestation of pregnancy: Secondary | ICD-10-CM

## 2017-06-30 DIAGNOSIS — O09213 Supervision of pregnancy with history of pre-term labor, third trimester: Secondary | ICD-10-CM

## 2017-06-30 DIAGNOSIS — Z1389 Encounter for screening for other disorder: Secondary | ICD-10-CM

## 2017-06-30 LAB — POCT URINALYSIS DIPSTICK
GLUCOSE UA: NEGATIVE
KETONES UA: NEGATIVE
LEUKOCYTES UA: NEGATIVE
Nitrite, UA: NEGATIVE
Protein, UA: NEGATIVE
RBC UA: NEGATIVE

## 2017-06-30 NOTE — Progress Notes (Signed)
Pt in for makena injection. She reports that she is feeling well and has good fetal movement. No problems or concerns today.

## 2017-07-01 DIAGNOSIS — Z3483 Encounter for supervision of other normal pregnancy, third trimester: Secondary | ICD-10-CM | POA: Diagnosis not present

## 2017-07-01 DIAGNOSIS — Z3482 Encounter for supervision of other normal pregnancy, second trimester: Secondary | ICD-10-CM | POA: Diagnosis not present

## 2017-07-07 ENCOUNTER — Encounter: Payer: Self-pay | Admitting: Obstetrics & Gynecology

## 2017-07-07 ENCOUNTER — Ambulatory Visit (INDEPENDENT_AMBULATORY_CARE_PROVIDER_SITE_OTHER): Payer: 59 | Admitting: Obstetrics & Gynecology

## 2017-07-07 VITALS — BP 98/50 | HR 94 | Wt 129.0 lb

## 2017-07-07 DIAGNOSIS — Z3483 Encounter for supervision of other normal pregnancy, third trimester: Secondary | ICD-10-CM

## 2017-07-07 DIAGNOSIS — Z3A34 34 weeks gestation of pregnancy: Secondary | ICD-10-CM

## 2017-07-07 DIAGNOSIS — Z1389 Encounter for screening for other disorder: Secondary | ICD-10-CM

## 2017-07-07 DIAGNOSIS — Z331 Pregnant state, incidental: Secondary | ICD-10-CM

## 2017-07-07 LAB — POCT URINALYSIS DIPSTICK
Glucose, UA: NEGATIVE
KETONES UA: NEGATIVE
Leukocytes, UA: NEGATIVE
NITRITE UA: NEGATIVE
PROTEIN UA: NEGATIVE
RBC UA: NEGATIVE

## 2017-07-07 NOTE — Progress Notes (Signed)
G2P0100 7973w1d Estimated Date of Delivery: 08/17/17  Blood pressure (!) 98/50, pulse 94, weight 129 lb (58.5 kg), last menstrual period 11/10/2016.   BP weight and urine results all reviewed and noted.  Please refer to the obstetrical flow sheet for the fundal height and fetal heart rate documentation:  Patient reports good fetal movement, denies any bleeding and no rupture of membranes symptoms or regular contractions. Patient is without complaints. All questions were answered.  Orders Placed This Encounter  Procedures  . POCT Urinalysis Dipstick    Plan:  Continued routine obstetrical care, cont 17 P  Return for 1 week 17 P, 2 weeks LROB appt.

## 2017-07-09 ENCOUNTER — Encounter: Payer: Self-pay | Admitting: Physician Assistant

## 2017-07-09 ENCOUNTER — Ambulatory Visit (INDEPENDENT_AMBULATORY_CARE_PROVIDER_SITE_OTHER): Payer: 59 | Admitting: Physician Assistant

## 2017-07-09 VITALS — BP 110/60 | HR 114 | Temp 97.6°F | Resp 16 | Wt 128.2 lb

## 2017-07-09 DIAGNOSIS — H8110 Benign paroxysmal vertigo, unspecified ear: Secondary | ICD-10-CM | POA: Diagnosis not present

## 2017-07-09 MED ORDER — MECLIZINE HCL 25 MG PO TABS
25.0000 mg | ORAL_TABLET | Freq: Three times a day (TID) | ORAL | 0 refills | Status: DC | PRN
Start: 2017-07-09 — End: 2017-08-13

## 2017-07-09 NOTE — Progress Notes (Signed)
    Patient ID: Carney HarderCourtney J Sefcik MRN: [id number], DOB: 02-Apr-1996, 22 y.o. Date of Encounter: 07/09/2017, 11:27 AM    Chief Complaint:  Chief Complaint  Patient presents with  . Dizziness     HPI: 22 y.o. year old female presents with above.   She says that symptoms started yesterday morning.  Yesterday morning was eating breakfast and started to feel "feeling like when you have been on a boat all day ".  Vomitted once yesterday morning.  Has not vomited anymore since then but has had some nausea intermittently especially after she eats.  States that that "feeling like you have been on a boat "was persistent all day yesterday.  Says that even when she lays down she stills feels that sensation.  Symptoms have continued today.  States that she has had no true spinning sensation but just this staggery sensation and some nausea.  Says that she has never had this occur before.  Has not had any viral URI symptoms recently.  No recent rhinorrhea or sneezing or nasal congestion or cough.     Home Meds:   Outpatient Medications Prior to Visit  Medication Sig Dispense Refill  . Prenatal Vit-Fe Fumarate-FA (PRENATAL VITAMIN PO) Take by mouth daily.    . tamsulosin (FLOMAX) 0.4 MG CAPS capsule Take 1 capsule (0.4 mg total) by mouth daily. (Patient not taking: Reported on 07/09/2017) 30 capsule 0   Facility-Administered Medications Prior to Visit  Medication Dose Route Frequency Provider Last Rate Last Dose  . HYDROXYprogesterone Caproate SOAJ 275 mg  275 mg Subcutaneous Weekly Cheral MarkerBooker, Kimberly R, [place]   275 mg at 07/07/17 1152    Allergies:  Allergies  Allergen Reactions  . Penicillins Hives  . Sulfa Antibiotics       Review of Systems: See HPI for pertinent ROS. All other ROS negative.    Physical Exam: Blood pressure 110/60, pulse (!) 114, temperature 97.6 F (36.4 C), temperature source Oral, resp. rate 16, weight 58.2 kg (128 lb 3.2 oz), last menstrual period 11/10/2016, SpO2 99 %.,  Body mass index is 22.71 kg/m. General: WNWD WF. Pregnant  Appears in no acute distress. HEENT: Normocephalic, atraumatic, eyes without discharge, sclera non-icteric, nares are without discharge. Bilateral auditory canals clear, TM's are without perforation, pearly grey and translucent with reflective cone of light bilaterally.  Neck: Supple. No thyromegaly. No lymphadenopathy. Lungs: Clear bilaterally to auscultation without wheezes, rales, or rhonchi. Breathing is unlabored. Heart: Regular rhythm. No murmurs, rubs, or gallops. [employer] [place] Maneuver: Positive--Does cause increased symptoms. Msk:  Strength and tone normal for age. Extremities/Skin: Warm and dry.  Neuro: Alert and oriented X 3. Moves all extremities spontaneously. Gait is normal. CNII-XII grossly in tact. Psych:  Responds to questions appropriately with a normal affect.     ASSESSMENT AND PLAN:  22 y.o. year old female with  1. Benign paroxysmal positional vertigo, unspecified laterality Meclizine is category B pregnancy so this is safe for her to use.  She can use this 1 every 4 hours as needed.  She is to follow-up if symptoms worsen or are not resolving within 5 days.  Is avoiding driving until symptoms are controlled/resolved. - meclizine (ANTIVERT) 25 MG tablet; Take 1 tablet (25 mg total) by mouth 3 (three) times daily as needed for dizziness.  Dispense: 30 tablet; Refill: 0   Signed, [address]Zacari Stiff Beth Con[place]ling [place], [place], [facility] 07/09/2017 11:27 AM

## 2017-07-14 ENCOUNTER — Ambulatory Visit (INDEPENDENT_AMBULATORY_CARE_PROVIDER_SITE_OTHER): Payer: 59

## 2017-07-14 VITALS — BP 90/60 | HR 97 | Wt 129.0 lb

## 2017-07-14 DIAGNOSIS — Z331 Pregnant state, incidental: Secondary | ICD-10-CM

## 2017-07-14 DIAGNOSIS — O09213 Supervision of pregnancy with history of pre-term labor, third trimester: Secondary | ICD-10-CM | POA: Diagnosis not present

## 2017-07-14 DIAGNOSIS — Z3A35 35 weeks gestation of pregnancy: Secondary | ICD-10-CM

## 2017-07-14 DIAGNOSIS — Z3483 Encounter for supervision of other normal pregnancy, third trimester: Secondary | ICD-10-CM

## 2017-07-14 DIAGNOSIS — Z1389 Encounter for screening for other disorder: Secondary | ICD-10-CM

## 2017-07-14 DIAGNOSIS — O09893 Supervision of other high risk pregnancies, third trimester: Secondary | ICD-10-CM

## 2017-07-14 LAB — POCT URINALYSIS DIPSTICK
Blood, UA: NEGATIVE
Glucose, UA: NEGATIVE
KETONES UA: NEGATIVE
NITRITE UA: NEGATIVE
PROTEIN UA: NEGATIVE

## 2017-07-14 MED ORDER — HYDROXYPROGESTERONE CAPROATE 275 MG/1.1ML ~~LOC~~ SOAJ
275.0000 mg | Freq: Once | SUBCUTANEOUS | Status: AC
  Administered 2017-07-14: 275 mg via SUBCUTANEOUS

## 2017-07-14 NOTE — Addendum Note (Signed)
Addended by: Federico FlakeNES, Zaxton Angerer A on: 07/14/2017 11:55 AM   Modules accepted: Level of Service

## 2017-07-14 NOTE — Progress Notes (Signed)
Pt here Makena injection 275 mg SQ rt arm. Tolerated well. Return 1 week for next injection. Pad CMA

## 2017-07-21 ENCOUNTER — Encounter: Payer: Self-pay | Admitting: Women's Health

## 2017-07-21 ENCOUNTER — Ambulatory Visit (INDEPENDENT_AMBULATORY_CARE_PROVIDER_SITE_OTHER): Payer: 59 | Admitting: Women's Health

## 2017-07-21 VITALS — BP 90/60 | HR 98 | Wt 128.4 lb

## 2017-07-21 DIAGNOSIS — Z3483 Encounter for supervision of other normal pregnancy, third trimester: Secondary | ICD-10-CM

## 2017-07-21 DIAGNOSIS — O99613 Diseases of the digestive system complicating pregnancy, third trimester: Secondary | ICD-10-CM

## 2017-07-21 DIAGNOSIS — Z331 Pregnant state, incidental: Secondary | ICD-10-CM

## 2017-07-21 DIAGNOSIS — O09213 Supervision of pregnancy with history of pre-term labor, third trimester: Secondary | ICD-10-CM

## 2017-07-21 DIAGNOSIS — Z3A36 36 weeks gestation of pregnancy: Secondary | ICD-10-CM | POA: Diagnosis not present

## 2017-07-21 DIAGNOSIS — Z1389 Encounter for screening for other disorder: Secondary | ICD-10-CM

## 2017-07-21 DIAGNOSIS — K5900 Constipation, unspecified: Secondary | ICD-10-CM

## 2017-07-21 DIAGNOSIS — O09893 Supervision of other high risk pregnancies, third trimester: Secondary | ICD-10-CM

## 2017-07-21 LAB — POCT URINALYSIS DIPSTICK
Blood, UA: NEGATIVE
Glucose, UA: NEGATIVE
Ketones, UA: NEGATIVE
Leukocytes, UA: NEGATIVE
NITRITE UA: NEGATIVE
PROTEIN UA: NEGATIVE

## 2017-07-21 MED ORDER — HYDROXYPROGESTERONE CAPROATE 275 MG/1.1ML ~~LOC~~ SOAJ
275.0000 mg | Freq: Once | SUBCUTANEOUS | Status: AC
  Administered 2017-07-21: 275 mg via SUBCUTANEOUS

## 2017-07-21 NOTE — Progress Notes (Signed)
   LOW-RISK PREGNANCY VISIT Patient name: Kelly Watts MRN [id number]  Date of birth: [birth year] Chief Complaint:   Routine Prenatal Visit (17p)  History of Present Illness:   Kelly HarderCourtney J Bibbins is a 22 y.o. 122P0100 female at 4081w1d with an Estimated Date of Delivery: 08/17/17 being seen today for ongoing management of a low-risk pregnancy.  Today she reports constipation, eating fruits, hasn't tried stool softeners. Contractions: Not present.  .  Movement: Present. denies leaking of fluid. Review of Systems:   Pertinent items are noted in HPI Denies abnormal vaginal discharge w/ itching/odor/irritation, headaches, visual changes, shortness of breath, chest pain, abdominal pain, severe nausea/vomiting, or problems with urination or bowel movements unless otherwise stated above. Pertinent History Reviewed:  Reviewed past medical,surgical, social, obstetrical and family history.  Reviewed problem list, medications and allergies. Physical Assessment:   Vitals:   07/21/17 0852  BP: 90/60  Pulse: 98  Weight: 128 lb 6.4 oz (58.2 kg)  Body mass index is 22.75 kg/m.        Physical Examination:   General appearance: Well appearing, and in no distress  Mental status: Alert, oriented to person, place, and time  Skin: Warm & dry  Cardiovascular: Normal heart rate noted  Respiratory: Normal respiratory effort, no distress  Abdomen: Soft, gravid, nontender  Pelvic: Cervical exam deferred         Extremities: Edema: None  Fetal Status: Fetal Heart Rate (bpm): 135 Fundal Height: 35 cm Movement: Present    Results for orders placed or performed in visit on 07/21/17 (from the past 24 hour(s))  POCT urinalysis dipstick   Collection Time: 07/21/17  9:19 AM  Result Value Ref Range   Color, UA     Clarity, UA     Glucose, UA neg    Bilirubin, UA     Ketones, UA neg    Spec Grav, UA  1.010 - 1.025   Blood, UA neg    pH, UA  5.0 - 8.0   Protein, UA neg    Urobilinogen, UA  0.2 or 1.0 E.U./dL    Nitrite, UA neg    Leukocytes, UA Negative Negative   Appearance     Odor      Assessment & Plan:  1) Low-risk pregnancy G2P0100 at 281w1d with an Estimated Date of Delivery: 08/17/17   2) Constipation, gave printed prevention/relief measures   3) H/O 22wk PTB d/t 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 Pt undecided about this, discussed today, will discuss more next week   Meds:  Meds ordered this encounter  Medications  . HYDROXYprogesterone Caproate SOAJ 275 mg   Labs/procedures today: none  Plan:  Continue routine obstetrical care   Reviewed: Preterm labor symptoms and general obstetric precautions including but not limited to vaginal bleeding, contractions, leaking of fluid and fetal movement were reviewed in detail with the patient.  All questions were answered  Follow-up: Return in about 1 week (around 07/28/2017) for LROB.  Orders Placed This Encounter  Procedures  . POCT urinalysis dipstick   Cheral MarkerKimberly R Booker CNM, [facility]NP-BC 07/21/2017 9:26 AM

## 2017-07-21 NOTE — Patient Instructions (Addendum)
Kelly Watts, I greatly value your feedback.  If you receive a survey following your visit with [place] today, we appreciate you taking the time to fill it out.  Thanks, Kelly Watts, CNM, WHNP-BC   Call the office [phone]) or go to [facility] if:  You begin to have strong, frequent contractions  Your water breaks.  Sometimes it is a big gush of fluid, sometimes it is just a trickle that keeps getting your panties wet or running down your legs  You have vaginal bleeding.  It is normal to have a small amount of spotting if your cervix was checked.   You don't feel your baby moving like normal.  If you don't, get you something to eat and drink and lay down and focus on feeling your baby move.  You should feel at least 10 movements in 2 hours.  If you don't, you should call the office or go to [facility].   Constipation  Drink plenty of fluid, preferably water, throughout the day  Eat foods high in fiber such as fruits, vegetables, and grains  Exercise, such as walking, is a good way to keep your bowels regular  Drink warm fluids, especially warm prune juice, or decaf coffee  Eat a 1/2 cup of real oatmeal (not instant), 1/2 cup applesauce, and 1/2-1 cup warm prune juice every day  If needed, you may take Colace (docusate sodium) stool softener once or twice a day to help keep the stool soft. If you are pregnant, wait until you are out of your first trimester (12-14 weeks of pregnancy)  If you still are having problems with constipation, you may take Miralax once daily as needed to help keep your bowels regular.  If you are pregnant, wait until you are out of your first trimester (12-14 weeks of pregnancy)       Preterm Labor and Birth Information The normal length of a pregnancy is 39-41 weeks. Preterm labor is when labor starts before 37 completed weeks of pregnancy. What are the risk factors for preterm labor? Preterm labor is more likely to occur in women who:  Have  certain infections during pregnancy such as a bladder infection, sexually transmitted infection, or infection inside the uterus (chorioamnionitis).  Have a shorter-than-normal cervix.  Have gone into preterm labor before.  Have had surgery on their cervix.  Are younger than age 69 or older than age 60.  Are African American.  Are pregnant with twins or multiple babies (multiple gestation).  Take street drugs or smoke while pregnant.  Do not gain enough weight while pregnant.  Became pregnant shortly after having been pregnant.  What are the symptoms of preterm labor? Symptoms of preterm labor include:  Cramps similar to those that can happen during a menstrual period. The cramps may happen with diarrhea.  Pain in the abdomen or lower back.  Regular uterine contractions that may feel like tightening of the abdomen.  A feeling of increased pressure in the pelvis.  Increased watery or bloody mucus discharge from the vagina.  Water breaking (ruptured amniotic sac).  Why is it important to recognize signs of preterm labor? It is important to recognize signs of preterm labor because babies who are born prematurely may not be fully developed. This can put them at an increased risk for:  Long-term (chronic) heart and lung problems.  Difficulty immediately after birth with regulating body systems, including blood sugar, body temperature, heart rate, and breathing rate.  Bleeding in the brain.  Cerebral palsy.  Learning difficulties.  Death.  These risks are highest for babies who are born before 34 weeks of pregnancy. How is preterm labor treated? Treatment depends on the length of your pregnancy, your condition, and the health of your baby. It may involve:  Having a stitch (suture) placed in your cervix to prevent your cervix from opening too early (cerclage).  Taking or being given medicines, such as: ? Hormone medicines. These may be given early in pregnancy to help  support the pregnancy. ? Medicine to stop contractions. ? Medicines to help mature the baby's lungs. These may be prescribed if the risk of delivery is high. ? Medicines to prevent your baby from developing cerebral palsy.  If the labor happens before 34 weeks of pregnancy, you may need to stay in the hospital. What should I do if I think I am in preterm labor? If you think that you are going into preterm labor, call your health care provider right away. How can I prevent preterm labor in future pregnancies? To increase your chance of having a full-term pregnancy:  Do not use any tobacco products, such as cigarettes, chewing tobacco, and e-cigarettes. If you need help quitting, ask your health care provider.  Do not use street drugs or medicines that have not been prescribed to you during your pregnancy.  Talk with your health care provider before taking any herbal supplements, even if you have been taking them regularly.  Make sure you gain a healthy amount of weight during your pregnancy.  Watch for infection. If you think that you might have an infection, get it checked right away.  Make sure to tell your health care provider if you have gone into preterm labor before.  This information is not intended to replace advice given to you by your health care provider. Make sure you discuss any questions you have with your health care provider. Document Released: 07/12/2003 Document Revised: 10/02/2015 Document Reviewed: 09/12/2015 Elsevier Interactive Patient Education  2018 [employer]

## 2017-07-31 ENCOUNTER — Ambulatory Visit (INDEPENDENT_AMBULATORY_CARE_PROVIDER_SITE_OTHER): Payer: 59 | Admitting: Obstetrics and Gynecology

## 2017-07-31 ENCOUNTER — Encounter: Payer: Self-pay | Admitting: Obstetrics and Gynecology

## 2017-07-31 VITALS — BP 102/60 | HR 97 | Wt 129.6 lb

## 2017-07-31 DIAGNOSIS — Z3A37 37 weeks gestation of pregnancy: Secondary | ICD-10-CM | POA: Diagnosis not present

## 2017-07-31 DIAGNOSIS — Z331 Pregnant state, incidental: Secondary | ICD-10-CM

## 2017-07-31 DIAGNOSIS — O09213 Supervision of pregnancy with history of pre-term labor, third trimester: Secondary | ICD-10-CM

## 2017-07-31 DIAGNOSIS — Z3483 Encounter for supervision of other normal pregnancy, third trimester: Secondary | ICD-10-CM | POA: Diagnosis not present

## 2017-07-31 DIAGNOSIS — Z1389 Encounter for screening for other disorder: Secondary | ICD-10-CM

## 2017-07-31 LAB — POCT URINALYSIS DIPSTICK
Glucose, UA: NEGATIVE
Ketones, UA: NEGATIVE
LEUKOCYTES UA: NEGATIVE
Nitrite, UA: NEGATIVE
PROTEIN UA: NEGATIVE
RBC UA: NEGATIVE

## 2017-07-31 LAB — OB RESULTS CONSOLE GBS: STREP GROUP B AG: NEGATIVE

## 2017-07-31 NOTE — Patient Instructions (Signed)
(  [phone] is the phone number for Pregnancy Classes or hospital tours at [facility].  ° °You will be referred to  [email or web address]   for more information on childbirth classes   °At this site you may register for classes. You may sign up for a waiting list if classes are full. Please SIGN UP FOR THIS!.   When the waiting list becomes long, sometimes new classes can be added. ° ° °[facility]: YOU GOTTA SEE IT! ° °Programs for YOU and your baby: New Baby and Parenting Classes  ° °[email or web address] ° °[address] °[place], [place] [address]  °Phone: [phone] ° ° °

## 2017-07-31 NOTE — Progress Notes (Signed)
   LOW-RISK PREGNANCY VISIT Patient name: Kelly Watts MRN [id number]  Date of birth: 05/03/1996 Chief Complaint:   Routine Prenatal Visit (gbs- gc- chl)  History of Present Illness:   Kelly Watts is a 22 y.o. 712P0100 female at 6458w4d with an Estimated Date of Delivery: 08/17/17 being seen today for ongoing management of a low-risk pregnancy. She will not be attending child birthing classes. She is thinking about having the nexplanon following this pregnancy, which she has had twice.    Today she reports no complaints. Contractions: Not present.  .  Movement: Present. denies leaking of fluid. Review of Systems:   Pertinent items are noted in HPI Denies abnormal vaginal discharge w/ itching/odor/irritation, headaches, visual changes, shortness of breath, chest pain, abdominal pain, severe nausea/vomiting, or problems with urination or bowel movements unless otherwise stated above. Pertinent History Reviewed:  Reviewed past medical,surgical, social, obstetrical and family history.  Reviewed problem list, medications and allergies. Physical Assessment:   Vitals:   07/31/17 1101  BP: 102/60  Pulse: 97  Weight: 129 lb 9.6 oz (58.8 kg)  Body mass index is 22.96 kg/m.        Physical Examination:   General appearance: Well appearing, and in no distress  Mental status: Alert, oriented to person, place, and time  Skin: Warm & dry  Cardiovascular: Normal heart rate noted  Respiratory: Normal respiratory effort, no distress  Abdomen: Soft, gravid, nontender  Pelvic: Cervical exam performed  Long, posterior  Extremities: Edema: None  Fetal Status:     Movement: Present   FH: 35 cm. FHR: 135  Results for orders placed or performed in visit on 07/31/17 (from the past 24 hour(s))  POCT urinalysis dipstick   Collection Time: 07/31/17 11:04 AM  Result Value Ref Range   Color, UA     Clarity, UA     Glucose, UA neg    Bilirubin, UA     Ketones, UA neg    Spec Grav, UA  1.010 - 1.025    Blood, UA neg    pH, UA  5.0 - 8.0   Protein, UA neg    Urobilinogen, UA  0.2 or 1.0 E.U./dL   Nitrite, UA neg    Leukocytes, UA Negative Negative   Appearance     Odor      Assessment & Plan:  1) Low-risk pregnancy G2P0100 at 6758w4d with an Estimated Date of Delivery: 08/17/17   2) H/O 22wk PTB d/t 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   Meds: No orders of the defined types were placed in this encounter.  Labs/procedures today: GBS and Gc/Chl  Plan:  Continue routine obstetrical care. F/u in 1 week for LROB  Reviewed: Preterm labor symptoms and general obstetric precautions including but not limited to vaginal bleeding, contractions, leaking of fluid and fetal movement were reviewed in detail with the patient.  All questions were answered  Follow-up: No follow-ups on file.  Orders Placed This Encounter  Procedures  . POCT urinalysis dipstick   By signing my name below, I, Soijett Blue, attest that this documentation has been prepared under the direction and in the presence of Tilda BurrowFerguson, Farhana Fellows V, MD. Electronically Signed: Soijett Blue, Scribe. 07/31/17. 9:20 AM.  I personally performed the services described in this documentation, which was SCRIBED in my presence. The recorded information has been reviewed and considered accurate. It has been edited as necessary during review. Tilda BurrowJohn V Nirvi Boehler, MD

## 2017-08-02 LAB — STREP GP B NAA+RFLX: Strep Gp B NAA+Rflx: NEGATIVE

## 2017-08-02 LAB — GC/CHLAMYDIA PROBE AMP
Chlamydia trachomatis, NAA: NEGATIVE
Neisseria gonorrhoeae by PCR: NEGATIVE

## 2017-08-03 ENCOUNTER — Telehealth (HOSPITAL_COMMUNITY): Payer: Self-pay | Admitting: *Deleted

## 2017-08-03 ENCOUNTER — Encounter (HOSPITAL_COMMUNITY): Payer: Self-pay | Admitting: *Deleted

## 2017-08-03 NOTE — Telephone Encounter (Signed)
Preadmission screen  

## 2017-08-06 ENCOUNTER — Ambulatory Visit (INDEPENDENT_AMBULATORY_CARE_PROVIDER_SITE_OTHER): Payer: 59 | Admitting: Women's Health

## 2017-08-06 ENCOUNTER — Encounter: Payer: Self-pay | Admitting: Women's Health

## 2017-08-06 VITALS — BP 100/60 | HR 98 | Wt 130.4 lb

## 2017-08-06 DIAGNOSIS — Z331 Pregnant state, incidental: Secondary | ICD-10-CM

## 2017-08-06 DIAGNOSIS — Z1389 Encounter for screening for other disorder: Secondary | ICD-10-CM

## 2017-08-06 DIAGNOSIS — O09213 Supervision of pregnancy with history of pre-term labor, third trimester: Secondary | ICD-10-CM

## 2017-08-06 DIAGNOSIS — Z3A38 38 weeks gestation of pregnancy: Secondary | ICD-10-CM

## 2017-08-06 DIAGNOSIS — Z3483 Encounter for supervision of other normal pregnancy, third trimester: Secondary | ICD-10-CM

## 2017-08-06 LAB — POCT URINALYSIS DIPSTICK
Blood, UA: NEGATIVE
Glucose, UA: NEGATIVE
Ketones, UA: NEGATIVE
Nitrite, UA: NEGATIVE
PROTEIN UA: NEGATIVE

## 2017-08-06 NOTE — Patient Instructions (Signed)
Kelly Watts, I greatly value your feedback.  If you receive a survey following your visit with us today, we appreciate you taking the time to fill it out.  Thanks, Joellyn HaffKim Rithy Mandley, CNM, WHNP-BC   Call the office [phone]) or [facility] to [facility] if:  You begin to have strong, frequent contractions  Your water breaks.  Sometimes it is a big gush of fluid, sometimes it is just a trickle that keeps getting your panties wet or running down your legs  You have vaginal bleeding.  It is normal to have a small amount of spotting if your cervix was checked.   You don't feel your baby moving like normal.  If you don't, get you something to eat and drink and lay down and focus on feeling your baby move.  You should feel at least 10 movements in 2 hours.  If you don't, you should call the office or go to [facility].     [facility] Contractions Contractions of the uterus can occur throughout pregnancy, but they are not always a sign that you are in labor. You may have practice contractions called Braxton Hicks contractions. These false labor contractions are sometimes confused with true labor. What are Deberah PeltonBraxton Hicks contractions? Braxton Hicks contractions are tightening movements that occur in the muscles of the uterus before labor. Unlike true labor contractions, these contractions do not result in opening (dilation) and thinning of the cervix. Toward the end of pregnancy (32-34 weeks), Braxton Hicks contractions can happen more often and may become stronger. These contractions are sometimes difficult to tell apart from true labor because they can be very uncomfortable. You should not feel embarrassed if you go to the hospital with false labor. Sometimes, the only way to tell if you are in true labor is for your health care provider to look for changes in the cervix. The health care provider will do a physical exam and may monitor your contractions. If you are not in true labor, the exam should  show that your cervix is not dilating and your water has not broken. If there are other health problems associated with your pregnancy, it is completely safe for you to be sent home with false labor. You may continue to have Braxton Hicks contractions until you go into true labor. How to tell the difference between true labor and false labor True labor  Contractions last 30-70 seconds.  Contractions become very regular.  Discomfort is usually felt in the top of the uterus, and it spreads to the lower abdomen and low back.  Contractions do not go away with walking.  Contractions usually become more intense and increase in frequency.  The cervix dilates and gets thinner. False labor  Contractions are usually shorter and not as strong as true labor contractions.  Contractions are usually irregular.  Contractions are often felt in the front of the lower abdomen and in the groin.  Contractions may go away when you walk around or change positions while lying down.  Contractions get weaker and are shorter-lasting as time goes on.  The cervix usually does not dilate or become thin. Follow these instructions at home:  Take over-the-counter and prescription medicines only as told by your health care provider.  Keep up with your usual exercises and follow other instructions from your health care provider.  Eat and drink lightly if you think you are going into labor.  If Braxton Hicks contractions are making you uncomfortable: ? Change your position from lying  down or resting to walking, or change from walking to resting. ? Sit and rest in a tub of warm water. ? Drink enough fluid to keep your urine pale yellow. Dehydration may cause these contractions. ? Do slow and deep breathing several times an hour.  Keep all follow-up prenatal visits as told by your health care provider. This is important. Contact a health care provider if:  You have a fever.  You have continuous pain in  your abdomen. Get help right away if:  Your contractions become stronger, more regular, and closer together.  You have fluid leaking or gushing from your vagina.  You pass blood-tinged mucus (bloody show).  You have bleeding from your vagina.  You have low back pain that you never had before.  You feel your baby's head pushing down and causing pelvic pressure.  Your baby is not moving inside you as much as it used to. Summary  Contractions that occur before labor are called Braxton Hicks contractions, false labor, or practice contractions.  Braxton Hicks contractions are usually shorter, weaker, farther apart, and less regular than true labor contractions. True labor contractions usually become progressively stronger and regular and they become more frequent.  Manage discomfort from [facility] contractions by changing position, resting in a warm bath, drinking plenty of water, or practicing deep breathing. This information is not intended to replace advice given to you by your health care provider. Make sure you discuss any questions you have with your health care provider. Document Released: 09/04/2016 Document Revised: 09/04/2016 Document Reviewed: 09/04/2016 Elsevier Interactive Patient Education  2018 [employer].

## 2017-08-06 NOTE — Progress Notes (Signed)
   LOW-RISK PREGNANCY VISIT Patient name: Kelly Watts MRN [id number]  Date of birth: 1996/02/02 Chief Complaint:   Routine Prenatal Visit (want to be check)  History of Present Illness:   Kelly Watts is a 22 y.o. 92P0100 female at 7475w3d with an Estimated Date of Delivery: 08/17/17 being seen today for ongoing management of a low-risk pregnancy. H/O 22wk PTB d/t PPRM, prolapsed cord, IUFD.  Today she reports no complaints. Contractions: Not present.  .  Movement: Absent. denies leaking of fluid. Review of Systems:   Pertinent items are noted in HPI Denies abnormal vaginal discharge w/ itching/odor/irritation, headaches, visual changes, shortness of breath, chest pain, abdominal pain, severe nausea/vomiting, or problems with urination or bowel movements unless otherwise stated above. Pertinent History Reviewed:  Reviewed past medical,surgical, social, obstetrical and family history.  Reviewed problem list, medications and allergies. Physical Assessment:   Vitals:   08/06/17 1053  BP: 100/60  Pulse: 98  Weight: 130 lb 6.4 oz (59.1 kg)  Body mass index is 23.1 kg/m.        Physical Examination:   General appearance: Well appearing, and in no distress  Mental status: Alert, oriented to person, place, and time  Skin: Warm & dry  Cardiovascular: Normal heart rate noted  Respiratory: Normal respiratory effort, no distress  Abdomen: Soft, gravid, nontender  Pelvic: Cervical exam performed  Dilation: 2.5 Effacement (%): 80 Station: -2  Extremities: Edema: Trace  Fetal Status: Fetal Heart Rate (bpm): 145 Fundal Height: 37 cm Movement: Absent Presentation: Vertex  Results for orders placed or performed in visit on 08/06/17 (from the past 24 hour(s))  POCT urinalysis dipstick   Collection Time: 08/06/17 10:56 AM  Result Value Ref Range   Color, UA     Clarity, UA     Glucose, UA neg    Bilirubin, UA     Ketones, UA neg    Spec Grav, UA  1.010 - 1.025   Blood, UA neg    pH,  UA  5.0 - 8.0   Protein, UA neg    Urobilinogen, UA  0.2 or 1.0 E.U./dL   Nitrite, UA neg    Leukocytes, UA Small (1+) (A) Negative   Appearance     Odor      Assessment & Plan:  1) Low-risk pregnancy G2P0100 at 3575w3d with an Estimated Date of Delivery: 08/17/17   2) H/O 22wk PTB d/t PPROM w/ prolapsed cord>IUFD, scheduled for IOL 4/9 @ [facility]   Meds: No orders of the defined types were placed in this encounter.  Labs/procedures today: sve  Plan:  Continue routine obstetrical care   Reviewed: Term labor symptoms and general obstetric precautions including but not limited to vaginal bleeding, contractions, leaking of fluid and fetal movement were reviewed in detail with the patient.  All questions were answered  Follow-up: Return for 6wks for pp visit, then next day for nexplanon insertion.  Orders Placed This Encounter  Procedures  . POCT urinalysis dipstick   Cheral MarkerKimberly R Laytoya Ion CNM, [facility]WHNP-BC 08/06/2017 11:36 AM

## 2017-08-11 ENCOUNTER — Inpatient Hospital Stay (HOSPITAL_COMMUNITY)
Admission: RE | Admit: 2017-08-11 | Discharge: 2017-08-13 | DRG: 807 | Disposition: A | Discharge status: Home / Self Care | Payer: 59 | Source: Ambulatory Visit | Attending: Family Medicine | Admitting: Family Medicine

## 2017-08-11 ENCOUNTER — Encounter (HOSPITAL_COMMUNITY): Payer: Self-pay

## 2017-08-11 ENCOUNTER — Inpatient Hospital Stay (HOSPITAL_COMMUNITY): Payer: 59 | Admitting: Anesthesiology

## 2017-08-11 DIAGNOSIS — O26899 Other specified pregnancy related conditions, unspecified trimester: Secondary | ICD-10-CM

## 2017-08-11 DIAGNOSIS — D649 Anemia, unspecified: Secondary | ICD-10-CM | POA: Diagnosis present

## 2017-08-11 DIAGNOSIS — O9902 Anemia complicating childbirth: Secondary | ICD-10-CM | POA: Diagnosis present

## 2017-08-11 DIAGNOSIS — Z6791 Unspecified blood type, Rh negative: Secondary | ICD-10-CM

## 2017-08-11 DIAGNOSIS — Z87891 Personal history of nicotine dependence: Secondary | ICD-10-CM | POA: Diagnosis not present

## 2017-08-11 DIAGNOSIS — O26893 Other specified pregnancy related conditions, third trimester: Principal | ICD-10-CM | POA: Diagnosis present

## 2017-08-11 DIAGNOSIS — Z3490 Encounter for supervision of normal pregnancy, unspecified, unspecified trimester: Secondary | ICD-10-CM

## 2017-08-11 DIAGNOSIS — Z3A39 39 weeks gestation of pregnancy: Secondary | ICD-10-CM

## 2017-08-11 DIAGNOSIS — Z880 Allergy status to penicillin: Secondary | ICD-10-CM | POA: Diagnosis not present

## 2017-08-11 DIAGNOSIS — Z8759 Personal history of other complications of pregnancy, childbirth and the puerperium: Secondary | ICD-10-CM

## 2017-08-11 LAB — CBC
HEMATOCRIT: 30.2 % — AB (ref 36.0–46.0)
HEMOGLOBIN: 9.9 g/dL — AB (ref 12.0–15.0)
MCH: 28.9 pg (ref 26.0–34.0)
MCHC: 32.8 g/dL (ref 30.0–36.0)
MCV: 88 fL (ref 78.0–100.0)
Platelets: 223 10*3/uL (ref 150–400)
RBC: 3.43 MIL/uL — ABNORMAL LOW (ref 3.87–5.11)
RDW: 13.6 % (ref 11.5–15.5)
WBC: 10.5 10*3/uL (ref 4.0–10.5)

## 2017-08-11 LAB — RPR: RPR: NONREACTIVE

## 2017-08-11 MED ORDER — DIPHENHYDRAMINE HCL 25 MG PO CAPS: 25.0000 mg | ORAL_CAPSULE | Freq: Four times a day (QID) | ORAL | Status: DC | PRN

## 2017-08-11 MED ORDER — OXYTOCIN 40 UNITS IN LACTATED RINGERS INFUSION - SIMPLE MED
INTRAVENOUS | Status: AC
  Filled 2017-08-11: qty 1000

## 2017-08-11 MED ORDER — SOD CITRATE-CITRIC ACID 500-334 MG/5ML PO SOLN
30.0000 mL | ORAL | Status: DC | PRN
  Administered 2017-08-11: 30 mL via ORAL
  Filled 2017-08-11: qty 15

## 2017-08-11 MED ORDER — ACETAMINOPHEN 325 MG PO TABS: 650.0000 mg | ORAL_TABLET | ORAL | Status: DC | PRN

## 2017-08-11 MED ORDER — DIPHENHYDRAMINE HCL 50 MG/ML IJ SOLN: 12.5000 mg | INTRAMUSCULAR | Status: DC | PRN

## 2017-08-11 MED ORDER — SIMETHICONE 80 MG PO CHEW: 80.0000 mg | CHEWABLE_TABLET | ORAL | Status: DC | PRN

## 2017-08-11 MED ORDER — PHENYLEPHRINE 40 MCG/ML (10ML) SYRINGE FOR IV PUSH (FOR BLOOD PRESSURE SUPPORT)
80.0000 ug | PREFILLED_SYRINGE | INTRAVENOUS | Status: DC | PRN
  Administered 2017-08-11: 80 ug via INTRAVENOUS
  Filled 2017-08-11: qty 5

## 2017-08-11 MED ORDER — ZOLPIDEM TARTRATE 5 MG PO TABS: 5.0000 mg | ORAL_TABLET | Freq: Every evening | ORAL | Status: DC | PRN

## 2017-08-11 MED ORDER — BENZOCAINE-MENTHOL 20-0.5 % EX AERO
1.0000 "application " | INHALATION_SPRAY | CUTANEOUS | Status: DC | PRN
  Administered 2017-08-13: 1 via TOPICAL
  Filled 2017-08-11: qty 56

## 2017-08-11 MED ORDER — OXYCODONE-ACETAMINOPHEN 5-325 MG PO TABS: 2.0000 | ORAL_TABLET | ORAL | Status: DC | PRN

## 2017-08-11 MED ORDER — WITCH HAZEL-GLYCERIN EX PADS: 1.0000 "application " | MEDICATED_PAD | CUTANEOUS | Status: DC | PRN

## 2017-08-11 MED ORDER — ONDANSETRON HCL 4 MG/2ML IJ SOLN: 4.0000 mg | INTRAMUSCULAR | Status: DC | PRN

## 2017-08-11 MED ORDER — ONDANSETRON HCL 4 MG PO TABS: 4.0000 mg | ORAL_TABLET | ORAL | Status: DC | PRN

## 2017-08-11 MED ORDER — IBUPROFEN 600 MG PO TABS
600.0000 mg | ORAL_TABLET | Freq: Four times a day (QID) | ORAL | Status: DC
  Administered 2017-08-11 – 2017-08-13 (×8): 600 mg via ORAL
  Filled 2017-08-11 (×8): qty 1

## 2017-08-11 MED ORDER — TETANUS-DIPHTH-ACELL PERTUSSIS 5-2.5-18.5 LF-MCG/0.5 IM SUSP: 0.5000 mL | Freq: Once | INTRAMUSCULAR | Status: DC

## 2017-08-11 MED ORDER — PHENYLEPHRINE 40 MCG/ML (10ML) SYRINGE FOR IV PUSH (FOR BLOOD PRESSURE SUPPORT)
80.0000 ug | PREFILLED_SYRINGE | INTRAVENOUS | Status: DC | PRN
  Filled 2017-08-11: qty 10
  Filled 2017-08-11: qty 5

## 2017-08-11 MED ORDER — COCONUT OIL OIL: 1.0000 "application " | TOPICAL_OIL | Status: DC | PRN

## 2017-08-11 MED ORDER — HYDROXYZINE HCL 50 MG PO TABS
50.0000 mg | ORAL_TABLET | Freq: Four times a day (QID) | ORAL | Status: DC | PRN
  Filled 2017-08-11: qty 1

## 2017-08-11 MED ORDER — OXYTOCIN 40 UNITS IN LACTATED RINGERS INFUSION - SIMPLE MED
1.0000 m[IU]/min | INTRAVENOUS | Status: DC
  Administered 2017-08-11: 16 m[IU]/min via INTRAVENOUS
  Administered 2017-08-11: 2 m[IU]/min via INTRAVENOUS
  Administered 2017-08-11: 12 m[IU]/min via INTRAVENOUS
  Administered 2017-08-11: 20 m[IU]/min via INTRAVENOUS
  Administered 2017-08-11: 18 m[IU]/min via INTRAVENOUS

## 2017-08-11 MED ORDER — LIDOCAINE HCL (PF) 1 % IJ SOLN
INTRAMUSCULAR | Status: DC | PRN
  Administered 2017-08-11: 4 mL
  Administered 2017-08-11: 6 mL

## 2017-08-11 MED ORDER — ONDANSETRON HCL 4 MG/2ML IJ SOLN: 4.0000 mg | Freq: Four times a day (QID) | INTRAMUSCULAR | Status: DC | PRN

## 2017-08-11 MED ORDER — EPHEDRINE 5 MG/ML INJ
10.0000 mg | INTRAVENOUS | Status: DC | PRN
  Filled 2017-08-11: qty 2

## 2017-08-11 MED ORDER — OXYCODONE-ACETAMINOPHEN 5-325 MG PO TABS: 1.0000 | ORAL_TABLET | ORAL | Status: DC | PRN

## 2017-08-11 MED ORDER — LACTATED RINGERS IV SOLN
500.0000 mL | INTRAVENOUS | Status: DC | PRN
  Administered 2017-08-11: 250 mL via INTRAVENOUS

## 2017-08-11 MED ORDER — FENTANYL 2.5 MCG/ML BUPIVACAINE 1/10 % EPIDURAL INFUSION (WH - ANES)
14.0000 mL/h | INTRAMUSCULAR | Status: DC | PRN
  Administered 2017-08-11: 14 mL/h via EPIDURAL
  Filled 2017-08-11: qty 100

## 2017-08-11 MED ORDER — OXYTOCIN 40 UNITS IN LACTATED RINGERS INFUSION - SIMPLE MED: 2.5000 [IU]/h | INTRAVENOUS | Status: DC

## 2017-08-11 MED ORDER — SENNOSIDES-DOCUSATE SODIUM 8.6-50 MG PO TABS
2.0000 | ORAL_TABLET | ORAL | Status: DC
  Administered 2017-08-11: 2 via ORAL
  Filled 2017-08-11 (×3): qty 2

## 2017-08-11 MED ORDER — FENTANYL CITRATE (PF) 100 MCG/2ML IJ SOLN
50.0000 ug | INTRAMUSCULAR | Status: DC | PRN
  Administered 2017-08-11: 100 ug via INTRAVENOUS
  Filled 2017-08-11: qty 2

## 2017-08-11 MED ORDER — PRENATAL MULTIVITAMIN CH
1.0000 | ORAL_TABLET | Freq: Every day | ORAL | Status: DC
  Administered 2017-08-12 – 2017-08-13 (×2): 1 via ORAL
  Filled 2017-08-11 (×2): qty 1

## 2017-08-11 MED ORDER — TERBUTALINE SULFATE 1 MG/ML IJ SOLN
0.2500 mg | Freq: Once | INTRAMUSCULAR | Status: DC | PRN
  Filled 2017-08-11: qty 1

## 2017-08-11 MED ORDER — LACTATED RINGERS IV SOLN
INTRAVENOUS | Status: DC
  Administered 2017-08-11 (×3): via INTRAVENOUS

## 2017-08-11 MED ORDER — LIDOCAINE HCL (PF) 1 % IJ SOLN
30.0000 mL | INTRAMUSCULAR | Status: DC | PRN
  Filled 2017-08-11: qty 30

## 2017-08-11 MED ORDER — LACTATED RINGERS IV SOLN
500.0000 mL | Freq: Once | INTRAVENOUS | Status: AC
  Administered 2017-08-11: 500 mL via INTRAVENOUS

## 2017-08-11 MED ORDER — DIBUCAINE 1 % RE OINT: 1.0000 "application " | TOPICAL_OINTMENT | RECTAL | Status: DC | PRN

## 2017-08-11 MED ORDER — OXYTOCIN BOLUS FROM INFUSION
500.0000 mL | Freq: Once | INTRAVENOUS | Status: AC
  Administered 2017-08-11: 500 mL via INTRAVENOUS

## 2017-08-11 MED ORDER — FLEET ENEMA 7-19 GM/118ML RE ENEM: 1.0000 | ENEMA | RECTAL | Status: DC | PRN

## 2017-08-11 NOTE — Anesthesia Procedure Notes (Signed)
Epidural Patient location during procedure: OB  Staffing Anesthesiologist: Irish Piech, MD  Preanesthetic Checklist Completed: patient identified, pre-op evaluation, timeout performed, IV checked, risks and benefits discussed and monitors and equipment checked  Epidural Patient position: sitting Prep: DuraPrep Patient monitoring: blood pressure and continuous pulse ox Approach: right paramedian Location: L3-L4 Injection technique: LOR air  Needle:  Needle type: Tuohy  Needle gauge: 17 G Needle insertion depth: 4 cm Catheter type: closed end flexible Catheter size: 19 Gauge Catheter at skin depth: 10 cm Test dose: negative  Assessment Sensory level: T8  Additional Notes  Dosing of Epidural:  1st dose, through catheter .............................................  Xylocaine 40 mg  2nd dose, through catheter, after waiting 3 minutes.........Xylocaine 60 mg    As each dose occurred, patient was free of IV sx; and patient exhibited no evidence of SA injection.  Patient is more comfortable after epidural dosed. Please see RN's note for documentation of vital signs,and FHR which are stable.  Patient reminded not to try to ambulate with numb legs, and that an RN must be present when she attempts to get up.           

## 2017-08-11 NOTE — H&P (Signed)
LABOR AND DELIVERY ADMISSION HISTORY AND PHYSICAL NOTE  Kelly Watts is a 22 y.o. female G2P0100 with IUP at [redacted]w[redacted]d by LMP + 7wk U/S presenting for IOL d/t hx of IUFD in her previous pregnancy.  She reports positive fetal movement. She denies leakage of fluid or vaginal bleeding.  Prenatal History/Complications: PNC at [facility] Pregnancy complications:  - Hx of PTD/IUFD at 22 weeks (PPROM -> cord prolapse -> IUFD) - Rh negative  Past Medical History: Past Medical History:  Diagnosis Date  . Encounter for Nexplanon removal 11/16/2015  . History of kidney stones   . History of kidney stones   . Kidney infection   . Nexplanon insertion 11/02/2012   Inserted left arm 11/02/12  . Patient underweight     Past Surgical History: Past Surgical History:  Procedure Laterality Date  . EAR TUBE REMOVAL    . EXTRACORPOREAL SHOCK WAVE LITHOTRIPSY Left 10/30/2016   Procedure: LEFT EXTRACORPOREAL SHOCK WAVE LITHOTRIPSY (ESWL);  Surgeon: Crist Fat, MD;  Location: [facility];  Service: Urology;  Laterality: Left;  . WISDOM TOOTH EXTRACTION      Obstetrical History: OB History    Gravida  2   Para  1   Term      Preterm  1   AB      Living        SAB      TAB      Ectopic      Multiple      Live Births              Social History: Social History   Socioeconomic History  . Marital status: Single    Spouse name: Not on file  . Number of children: Not on file  . Years of education: Not on file  . Highest education level: Not on file  Occupational History  . Not on file  Social Needs  . Financial resource strain: Not on file  . Food insecurity:    Worry: Not on file    Inability: Not on file  . Transportation needs:    Medical: Not on file    Non-medical: Not on file  Tobacco Use  . Smoking status: Former Smoker    Packs/day: 0.25    Years: 3.00    Pack years: 0.75    Types: Cigarettes    Last attempt to quit: 01/30/2016    Years since quitting:  1.5  . Smokeless tobacco: Never Used  Substance and Sexual Activity  . Alcohol use: No    Comment: occ  . Drug use: No  . Sexual activity: Yes    Birth control/protection: None  Lifestyle  . Physical activity:    Days per week: Not on file    Minutes per session: Not on file  . Stress: Not on file  Relationships  . Social connections:    Talks on phone: Not on file    Gets together: Not on file    Attends religious service: Not on file    Active member of club or organization: Not on file    Attends meetings of clubs or organizations: Not on file    Relationship status: Not on file  Other Topics Concern  . Not on file  Social History Narrative  . Not on file    Family History: Family History  Problem Relation Age of Onset  . Coronary artery disease Paternal Grandfather   . Cancer Paternal Grandmother  breast  . Diabetes Maternal Grandmother   . Thyroid disease Maternal Grandmother   . Heart disease Maternal Grandmother   . Melanoma Maternal Grandfather     Allergies: Allergies  Allergen Reactions  . Penicillins Hives  . Sulfa Antibiotics     Medications Prior to Admission  Medication Sig Dispense Refill Last Dose  . meclizine (ANTIVERT) 25 MG tablet Take 1 tablet (25 mg total) by mouth 3 (three) times daily as needed for dizziness. (Patient not taking: Reported on 07/14/2017) 30 tablet 0 Not Taking  . Prenatal Vit-Fe Fumarate-FA (PRENATAL VITAMIN PO) Take by mouth daily.   Taking     Review of Systems  All systems reviewed and negative except as stated in HPI  Physical Exam Blood pressure 116/72, pulse 83, temperature 97.6 F (36.4 C), temperature source Oral, resp. rate 18, last menstrual period 11/10/2016. General appearance: alert, oriented, NAD Lungs: normal respiratory effort Heart: regular rate Abdomen: soft, non-tender; gravid, FH appropriate for GA Extremities: No calf swelling or tenderness Presentation: cephalic by SVE Fetal monitoring:  baseline rate 150, moderate variability, +acel, no decel Uterine activity: ctx q 3 min Dilation: 2.5 Effacement (%): 70 Station: -2 Exam by:: Irving BurtonEmily Rothermel RN   Prenatal labs: ABO, Rh: O/Negative/-- (09/18 1225) Antibody: Negative (01/22 0907) Rubella: 2.89 (09/18 1225) RPR: Non Reactive (01/22 0907)  HBsAg: Negative (09/18 1225)  HIV: Non Reactive (01/22 0907)  GC/Chlamydia: negative GBS:   negative 2-hr GTT: normal Genetic screening:  Negative first trimester screen Anatomy US: normal female  Prenatal Transfer Tool  Maternal Diabetes: No Genetic Screening: Normal Maternal Ultrasounds/Referrals: Normal Fetal Ultrasounds or other Referrals:  None Maternal Substance Abuse:  No Significant Maternal Medications:  None Significant Maternal Lab Results: Lab values include: Group B Strep negative   Assessment: Kelly Watts is a 22 y.o. G2P0100 at 7833w1d here for IOL for hx of IUFD  #Labor: IV Pitocin #Pain: Per patient's request #FWB: Cat I #ID:  GBS negative #MOF: breast #MOC:undediced; discuss posptartum #Circ:  N/a (girl)  Kandra NicolasJulie P Zaim Nitta 08/11/2017, 1:26 AM

## 2017-08-11 NOTE — Progress Notes (Signed)
Patient ID: Kelly Watts, female   DOB: 1995-10-29, 22 y.o.   MRN: [id number]  Patient comfortable with contractions  BP 110/65   Pulse 67   Temp 98.4 F (36.9 C) (Oral)   Resp 17   Ht 5\' 3"  (1.6 m)   Wt 133 lb 14.4 oz (60.7 kg)   LMP 11/10/2016 (Exact Date)   BMI 23.72 kg/m   4cm/80/-1  AROM with clear fluid Category 1 tracing  Continue pitocin.  Levie HeritageStinson, Muhanad Torosyan J, DO 08/11/2017 1:25 PM

## 2017-08-11 NOTE — Progress Notes (Signed)
Labor Progress Note  Carney HarderCourtney J Chamberland is a 22 y.o. G2P0100 at 3128w1d  admitted for induction of labor due to h/o IUFD.  S: Doing well. No complaints.   O:  BP 101/62   Pulse 64   Temp 98.4 F (36.9 C) (Oral)   Resp 18   Ht 5\' 3"  (1.6 m)   Wt 60.7 kg (133 lb 14.4 oz)   LMP 11/10/2016 (Exact Date)   BMI 23.72 kg/m   No intake/output data recorded.  FHT:  FHR: 130 bpm, variability: moderate,  accelerations:  Present,  decelerations:  Absent UC:   regular, every 2-3 minutes SVE:   Dilation: 3 Effacement (%): 80 Station: -3 Exam by:: Dr. Nira Retortegele  Pitocin @ 16 mu/min  Labs: Lab Results  Component Value Date   WBC 10.5 08/11/2017   HGB 9.9 (L) 08/11/2017   HCT 30.2 (L) 08/11/2017   MCV 88.0 08/11/2017   PLT 223 08/11/2017    Assessment / Plan: 22 y.o. G2P0100 6728w1d in early labor Induction of labor,  progressing well on pitocin  Labor: Progressing on Pitocin, will continue to increase then AROM Fetal Wellbeing:  Category I Pain Control:  Labor support without medications Anticipated MOD:  NSVD  Caryl AdaJazma Phelps, DO OB Fellow [facility], [facility]

## 2017-08-11 NOTE — Anesthesia Pain Management Evaluation Note (Signed)
  CRNA Pain Management Visit Note  Patient: Kelly Watts, 22 y.o., female  "Hello I am a [facility] of the [facility]. We have an anesthesia team available at all times to provide care throughout the hospital, including epidural management and anesthesia for C-section. I don't know your plan for the delivery whether it a natural birth, water birth, IV sedation, nitrous supplementation, doula or epidural, but we want to meet your pain goals."   1.Was your pain managed to your expectations on prior hospitalizations?   No prior hospitalizations  2.What is your expectation for pain management during this hospitalization?     Labor support without medications and IV pain meds  3.How can we help you reach that goal? support  Record the patient's initial score and the patient's pain goal.   Pain: 3  Pain Goal: 6 [facility] wants you to be able to say your pain was always managed very well.  Trellis PaganiniBREWER,Tarnisha Kachmar N 08/11/2017

## 2017-08-11 NOTE — Anesthesia Preprocedure Evaluation (Signed)
Anesthesia Evaluation  Patient identified by MRN, date of birth, ID band Patient awake    Reviewed: Allergy & Precautions, H&P , Patient's Chart, lab work & pertinent test results  Airway Mallampati: II  TM Distance: >3 FB Neck ROM: full    Dental no notable dental hx.    Pulmonary former smoker,    Pulmonary exam normal breath sounds clear to auscultation       Cardiovascular Exercise Tolerance: Good  Rhythm:regular Rate:Normal     Neuro/Psych    GI/Hepatic   Endo/Other    Renal/GU      Musculoskeletal   Abdominal   Peds  Hematology   Anesthesia Other Findings   Reproductive/Obstetrics                             Anesthesia Physical Anesthesia Plan  ASA: II  Anesthesia Plan: Epidural   Post-op Pain Management:    Induction:   PONV Risk Score and Plan:   Airway Management Planned:   Additional Equipment:   Intra-op Plan:   Post-operative Plan:   Informed Consent: I have reviewed the patients History and Physical, chart, labs and discussed the procedure including the risks, benefits and alternatives for the proposed anesthesia with the patient or authorized representative who has indicated his/her understanding and acceptance.   Dental Advisory Given  Plan Discussed with:   Anesthesia Plan Comments: (Labs checked- platelets confirmed with RN in room. Fetal heart tracing, per RN, reported to be stable enough for sitting procedure. Discussed epidural, and patient consents to the procedure:  included risk of possible headache,backache, failed block, allergic reaction, and nerve injury. This patient was asked if she had any questions or concerns before the procedure started.)        Anesthesia Quick Evaluation  

## 2017-08-12 ENCOUNTER — Other Ambulatory Visit: Payer: Self-pay

## 2017-08-12 DIAGNOSIS — Z3A39 39 weeks gestation of pregnancy: Secondary | ICD-10-CM

## 2017-08-12 LAB — BIRTH TISSUE RECOVERY COLLECTION (PLACENTA DONATION)

## 2017-08-12 MED ORDER — IBUPROFEN 600 MG PO TABS: 600.0000 mg | ORAL_TABLET | Freq: Four times a day (QID) | ORAL | 0 refills | Status: DC | PRN

## 2017-08-12 NOTE — Anesthesia Postprocedure Evaluation (Signed)
Anesthesia Post Note  Patient: Kelly Watts  Procedure(s) Performed: AN AD HOC LABOR EPIDURAL     Patient location during evaluation: Mother Baby Anesthesia Type: Epidural Level of consciousness: awake and alert and oriented Pain management: satisfactory to patient Vital Signs Assessment: post-procedure vital signs reviewed and stable Respiratory status: respiratory function stable Cardiovascular status: stable Postop Assessment: no headache, no backache, epidural receding, patient able to bend at knees, no signs of nausea or vomiting and adequate PO intake Anesthetic complications: no    Last Vitals:  Vitals:   08/12/17 0043 08/12/17 0808  BP: (!) 103/50 106/66  Pulse: 68 (!) 59  Resp: 14 18  Temp: 36.7 C 36.4 C  SpO2: 96%     Last Pain:  Vitals:   08/12/17 0811  TempSrc:   PainSc: 0-No pain   Pain Goal: Patients Stated Pain Goal: 0 (08/11/17 2330)               [place] Blizzard

## 2017-08-12 NOTE — Discharge Instructions (Signed)
NO SEX UNTIL AFTER YOU GET YOUR BIRTH CONTROL  ° ° °Postpartum Care After Vaginal Delivery °The period of time right after you deliver your newborn is called the postpartum period. °What kind of medical care will I receive? °· You may continue to receive fluids and medicines through an IV tube inserted into one of your veins. °· If an incision was made near your vagina (episiotomy) or if you had some vaginal tearing during delivery, cold compresses may be placed on your episiotomy or your tear. This helps to reduce pain and swelling. °· You may be given a squirt bottle to use when you go to the bathroom. You may use this until you are comfortable wiping as usual. To use the squirt bottle, follow these steps: °? Before you urinate, fill the squirt bottle with warm water. Do not use hot water. °? After you urinate, while you are sitting on the toilet, use the squirt bottle to rinse the area around your urethra and vaginal opening. This rinses away any urine and blood. °? You may do this instead of wiping. As you start healing, you may use the squirt bottle before wiping yourself. Make sure to wipe gently. °? Fill the squirt bottle with clean water every time you use the bathroom. °· You will be given sanitary pads to wear. °How can I expect to feel? °· You may not feel the need to urinate for several hours after delivery. °· You will have some soreness and pain in your abdomen and vagina. °· If you are breastfeeding, you may have uterine contractions every time you breastfeed for up to several weeks postpartum. Uterine contractions help your uterus return to its normal size. °· It is normal to have vaginal bleeding (lochia) after delivery. The amount and appearance of lochia is often similar to a menstrual period in the first week after delivery. It will gradually decrease over the next few weeks to a dry, yellow-brown discharge. For most women, lochia stops completely by 6-8 weeks after delivery. Vaginal bleeding can  vary from woman to woman. °· Within the first few days after delivery, you may have breast engorgement. This is when your breasts feel heavy, full, and uncomfortable. Your breasts may also throb and feel hard, tightly stretched, warm, and tender. After this occurs, you may have milk leaking from your breasts. Your health care provider can help you relieve discomfort due to breast engorgement. Breast engorgement should go away within a few days. °· You may feel more sad or worried than normal due to hormonal changes after delivery. These feelings should not last more than a few days. If these feelings do not go away after several days, speak with your health care provider. °How should I care for myself? °· Tell your health care provider if you have pain or discomfort. °· Drink enough water to keep your urine clear or pale yellow. °· Wash your hands thoroughly with soap and water for at least 20 seconds after changing your sanitary pads, after using the toilet, and before holding or feeding your baby. °· If you are not breastfeeding, avoid touching your breasts a lot. Doing this can make your breasts produce more milk. °· If you become weak or lightheaded, or you feel like you might faint, ask for help before: °? Getting out of bed. °? Showering. °· Change your sanitary pads frequently. Watch for any changes in your flow, such as a sudden increase in volume, a change in color, the passing of large blood   clots. If you pass a blood clot from your vagina, save it to show to your health care provider. Do not flush blood clots down the toilet without having your health care provider look at them. °· Make sure that all your vaccinations are up to date. This can help protect you and your baby from getting certain diseases. You may need to have immunizations done before you leave the hospital. °· If desired, talk with your health care provider about methods of family planning or birth control (contraception). °How can I start  bonding with my baby? °Spending as much time as possible with your baby is very important. During this time, you and your baby can get to know each other and develop a bond. Having your baby stay with you in your room (rooming in) can give you time to get to know your baby. Rooming in can also help you become comfortable caring for your baby. Breastfeeding can also help you bond with your baby. °How can I plan for returning home with my baby? °· Make sure that you have a car seat installed in your vehicle. °? Your car seat should be checked by a certified car seat installer to make sure that it is installed safely. °? Make sure that your baby fits into the car seat safely. °· Ask your health care provider any questions you have about caring for yourself or your baby. Make sure that you are able to contact your health care provider with any questions after leaving the hospital. °This information is not intended to replace advice given to you by your health care provider. Make sure you discuss any questions you have with your health care provider. °Document Released: 02/16/2007 Document Revised: 09/24/2015 Document Reviewed: 03/26/2015 °Elsevier Interactive Patient Education © 2018 [employer] ° ° °Breastfeeding °Choosing to breastfeed is one of the best decisions you can make for yourself and your baby. A change in hormones during pregnancy causes your breasts to make breast milk in your milk-producing glands. Hormones prevent breast milk from being released before your baby is born. They also prompt milk flow after birth. Once breastfeeding has begun, thoughts of your baby, as well as his or her sucking or crying, can stimulate the release of milk from your milk-producing glands. °Benefits of breastfeeding °Research shows that breastfeeding offers many health benefits for infants and mothers. It also offers a cost-free and convenient way to feed your baby. °For your baby °· Your first milk (colostrum) helps your  baby's digestive system to function better. °· Special cells in your milk (antibodies) help your baby to fight off infections. °· Breastfed babies are less likely to develop asthma, allergies, obesity, or type 2 diabetes. They are also at lower risk for sudden infant death syndrome (SIDS). °· Nutrients in breast milk are better able to meet your baby’s needs compared to infant formula. °· Breast milk improves your baby's brain development. °For you °· Breastfeeding helps to create a very special bond between you and your baby. °· Breastfeeding is convenient. Breast milk costs nothing and is always available at the correct temperature. °· Breastfeeding helps to burn calories. It helps you to lose the weight that you gained during pregnancy. °· Breastfeeding makes your uterus return faster to its size before pregnancy. It also slows bleeding (lochia) after you give birth. °· Breastfeeding helps to lower your risk of developing type 2 diabetes, osteoporosis, rheumatoid arthritis, cardiovascular disease, and breast, ovarian, uterine, and endometrial cancer later in life. °Breastfeeding basics °  Starting breastfeeding °· Find a comfortable place to sit or lie down, with your neck and back well-supported. °· Place a pillow or a rolled-up blanket under your baby to bring him or her to the level of your breast (if you are seated). Nursing pillows are specially designed to help support your arms and your baby while you breastfeed. °· Make sure that your baby's tummy (abdomen) is facing your abdomen. °· Gently massage your breast. With your fingertips, massage from the outer edges of your breast inward toward the nipple. This encourages milk flow. If your milk flows slowly, you may need to continue this action during the feeding. °· Support your breast with 4 fingers underneath and your thumb above your nipple (make the letter "C" with your hand). Make sure your fingers are well away from your nipple and your baby’s  mouth. °· Stroke your baby's lips gently with your finger or nipple. °· When your baby's mouth is open wide enough, quickly bring your baby to your breast, placing your entire nipple and as much of the areola as possible into your baby's mouth. The areola is the colored area around your nipple. °? More areola should be visible above your baby's upper lip than below the lower lip. °? Your baby's lips should be opened and extended outward (flanged) to ensure an adequate, comfortable latch. °? Your baby's tongue should be between his or her lower gum and your breast. °· Make sure that your baby's mouth is correctly positioned around your nipple (latched). Your baby's lips should create a seal on your breast and be turned out (everted). °· It is common for your baby to suck about 2-3 minutes in order to start the flow of breast milk. °Latching °Teaching your baby how to latch onto your breast properly is very important. An improper latch can cause nipple pain, decreased milk supply, and poor weight gain in your baby. Also, if your baby is not latched onto your nipple properly, he or she may swallow some air during feeding. This can make your baby fussy. Burping your baby when you switch breasts during the feeding can help to get rid of the air. However, teaching your baby to latch on properly is still the best way to prevent fussiness from swallowing air while breastfeeding. °Signs that your baby has successfully latched onto your nipple °· Silent tugging or silent sucking, without causing you pain. Infant's lips should be extended outward (flanged). °· Swallowing heard between every 3-4 sucks once your milk has started to flow (after your let-down milk reflex occurs). °· Muscle movement above and in front of his or her ears while sucking. ° °Signs that your baby has not successfully latched onto your nipple °· Sucking sounds or smacking sounds from your baby while breastfeeding. °· Nipple pain. ° °If you think your  baby has not latched on correctly, slip your finger into the corner of your baby’s mouth to break the suction and place it between your baby's gums. Attempt to start breastfeeding again. °Signs of successful breastfeeding °Signs from your baby °· Your baby will gradually decrease the number of sucks or will completely stop sucking. °· Your baby will fall asleep. °· Your baby's body will relax. °· Your baby will retain a small amount of milk in his or her mouth. °· Your baby will let go of your breast by himself or herself. ° °Signs from you °· Breasts that have increased in firmness, weight, and size 1-3 hours after feeding. °·   Breasts that are softer immediately after breastfeeding. °· Increased milk volume, as well as a change in milk consistency and color by the fifth day of breastfeeding. °· Nipples that are not sore, cracked, or bleeding. ° °Signs that your baby is getting enough milk °· Wetting at least 1-2 diapers during the first 24 hours after birth. °· Wetting at least 5-6 diapers every 24 hours for the first week after birth. The urine should be clear or pale yellow by the age of 5 days. °· Wetting 6-8 diapers every 24 hours as your baby continues to grow and develop. °· At least 3 stools in a 24-hour period by the age of 5 days. The stool should be soft and yellow. °· At least 3 stools in a 24-hour period by the age of 7 days. The stool should be seedy and yellow. °· No loss of weight greater than 10% of birth weight during the first 3 days of life. °· Average weight gain of 4-7 oz (113-198 g) per week after the age of 4 days. °· Consistent daily weight gain by the age of 5 days, without weight loss after the age of 2 weeks. °After a feeding, your baby may spit up a small amount of milk. This is normal. °Breastfeeding frequency and duration °Frequent feeding will help you make more milk and can prevent sore nipples and extremely full breasts (breast engorgement). Breastfeed when you feel the need to  reduce the fullness of your breasts or when your baby shows signs of hunger. This is called "breastfeeding on demand." Signs that your baby is hungry include: °· Increased alertness, activity, or restlessness. °· Movement of the head from side to side. °· Opening of the mouth when the corner of the mouth or cheek is stroked (rooting). °· Increased sucking sounds, smacking lips, cooing, sighing, or squeaking. °· Hand-to-mouth movements and sucking on fingers or hands. °· Fussing or crying. ° °Avoid introducing a pacifier to your baby in the first 4-6 weeks after your baby is born. After this time, you may choose to use a pacifier. Research has shown that pacifier use during the first year of a baby's life decreases the risk of sudden infant death syndrome (SIDS). °Allow your baby to feed on each breast as long as he or she wants. When your baby unlatches or falls asleep while feeding from the first breast, offer the second breast. Because newborns are often sleepy in the first few weeks of life, you may need to awaken your baby to get him or her to feed. °Breastfeeding times will vary from baby to baby. However, the following rules can serve as a guide to help you make sure that your baby is properly fed: °· Newborns (babies 4 weeks of age or younger) may breastfeed every 1-3 hours. °· Newborns should not go without breastfeeding for longer than 3 hours during the day or 5 hours during the night. °· You should breastfeed your baby a minimum of 8 times in a 24-hour period. ° °Breast milk pumping °Pumping and storing breast milk allows you to make sure that your baby is exclusively fed your breast milk, even at times when you are unable to breastfeed. This is especially important if you go back to work while you are still breastfeeding, or if you are not able to be present during feedings. Your lactation consultant can help you find a method of pumping that works best for you and give you guidelines about how long it  is   safe to store breast milk. °Caring for your breasts while you breastfeed °Nipples can become dry, cracked, and sore while breastfeeding. The following recommendations can help keep your breasts moisturized and healthy: °· Avoid using soap on your nipples. °· Wear a supportive bra designed especially for nursing. Avoid wearing underwire-style bras or extremely tight bras (sports bras). °· Air-dry your nipples for 3-4 minutes after each feeding. °· Use only cotton bra pads to absorb leaked breast milk. Leaking of breast milk between feedings is normal. °· Use lanolin on your nipples after breastfeeding. Lanolin helps to maintain your skin's normal moisture barrier. Pure lanolin is not harmful (not toxic) to your baby. You may also hand express a few drops of breast milk and gently massage that milk into your nipples and allow the milk to air-dry. ° °In the first few weeks after giving birth, some women experience breast engorgement. Engorgement can make your breasts feel heavy, warm, and tender to the touch. Engorgement peaks within 3-5 days after you give birth. The following recommendations can help to ease engorgement: °· Completely empty your breasts while breastfeeding or pumping. You may want to start by applying warm, moist heat (in the shower or with warm, water-soaked hand towels) just before feeding or pumping. This increases circulation and helps the milk flow. If your baby does not completely empty your breasts while breastfeeding, pump any extra milk after he or she is finished. °· Apply ice packs to your breasts immediately after breastfeeding or pumping, unless this is too uncomfortable for you. To do this: °? Put ice in a plastic bag. °? Place a towel between your skin and the bag. °? Leave the ice on for 20 minutes, 2-3 times a day. °· Make sure that your baby is latched on and positioned properly while breastfeeding. ° °If engorgement persists after 48 hours of following these recommendations,  contact your health care provider or a lactation consultant. °Overall health care recommendations while breastfeeding °· Eat 3 healthy meals and 3 snacks every day. Well-nourished mothers who are breastfeeding need an additional 450-500 calories a day. You can meet this requirement by increasing the amount of a balanced diet that you eat. °· Drink enough water to keep your urine pale yellow or clear. °· Rest often, relax, and continue to take your prenatal vitamins to prevent fatigue, stress, and low vitamin and mineral levels in your body (nutrient deficiencies). °· Do not use any products that contain nicotine or tobacco, such as cigarettes and e-cigarettes. Your baby may be harmed by chemicals from cigarettes that pass into breast milk and exposure to secondhand smoke. If you need help quitting, ask your health care provider. °· Avoid alcohol. °· Do not use illegal drugs or marijuana. °· Talk with your health care provider before taking any medicines. These include over-the-counter and prescription medicines as well as vitamins and herbal supplements. Some medicines that may be harmful to your baby can pass through breast milk. °· It is possible to become pregnant while breastfeeding. If birth control is desired, ask your health care provider about options that will be safe while breastfeeding your baby. °Where to find more information: °[employer]: www.llli.org °Contact a health care provider if: °· You feel like you want to stop breastfeeding or have become frustrated with breastfeeding. °· Your nipples are cracked or bleeding. °· Your breasts are red, tender, or warm. °· You have: °? Painful breasts or nipples. °? A swollen area on either breast. °? A fever   or chills. °? Nausea or vomiting. °? Drainage other than breast milk from your nipples. °· Your breasts do not become full before feedings by the fifth day after you give birth. °· You feel sad and depressed. °· Your baby is: °? Too  sleepy to eat well. °? Having trouble sleeping. °? More than 1 week old and wetting fewer than 6 diapers in a 24-hour period. °? Not gaining weight by 5 days of age. °· Your baby has fewer than 3 stools in a 24-hour period. °· Your baby's skin or the white parts of his or her eyes become yellow. °Get help right away if: °· Your baby is overly tired (lethargic) and does not want to wake up and feed. °· Your baby develops an unexplained fever. °Summary °· Breastfeeding offers many health benefits for infant and mothers. °· Try to breastfeed your infant when he or she shows early signs of hunger. °· Gently tickle or stroke your baby's lips with your finger or nipple to allow the baby to open his or her mouth. Bring the baby to your breast. Make sure that much of the areola is in your baby's mouth. Offer one side and burp the baby before you offer the other side. °· Talk with your health care provider or lactation consultant if you have questions or you face problems as you breastfeed. °This information is not intended to replace advice given to you by your health care provider. Make sure you discuss any questions you have with your health care provider. °Document Released: 04/21/2005 Document Revised: 05/23/2016 Document Reviewed: 05/23/2016 °Elsevier Interactive Patient Education © 2018 [employer] ° ° °

## 2017-08-12 NOTE — Lactation Note (Addendum)
This note was copied from a baby's chart. Lactation Consultation Note Baby 12 hrs old. Mom stated she was tired. Mom had baby laying in her lap in bed. Offered to put baby in crib, mom declined. Mom slow to respond d/t tired. Encouraged mom to call for assistance if needed. Mom is [employer]. Mom has round breast w/long shaft everted nipples. Mom states breast are tender. Hand expression demonstrated colostrum to tip of nipple.  Mom had IUFD w/1st child. Mom is very tired and doesn't talk unless asked question.  Mom encouraged to feed baby 8-12 times/24 hours and with feeding cues. Encouraged STS, I&O, and to call for questions. [facility] brochure given w/resources, support groups and LC services.  Patient Name: Kelly Watts'UToday's Date: 08/12/2017 Reason for consult: Initial assessment   Maternal Data Does the patient have breastfeeding experience prior to this delivery?: No  Feeding    LATCH Score       Type of Nipple: Everted at rest and after stimulation  Comfort (Breast/Nipple): Soft / non-tender        Interventions Interventions: Breast feeding basics reviewed;Skin to skin;Breast massage;Hand express;Breast compression  Lactation Tools Discussed/Used     Consult Status Consult Status: Follow-up Date: 08/12/17 Follow-up type: In-patient    Charyl DancerCARVER, Kasara Schomer G 08/12/2017, 4:58 AM

## 2017-08-12 NOTE — Progress Notes (Addendum)
Patient requesting to cancel early discharge order. This RN obtained verbal order from Wells Fargoolitta Dawson at 1223.

## 2017-08-12 NOTE — Lactation Note (Addendum)
This note was copied from a baby's chart. Lactation Consultation Note  Patient Name: Kelly Watts JYNWG'NToday's Date: 08/12/2017 Reason for consult: Follow-up assessment   Baby 16 hours old. Mother has [employer] - [employer] employee. Provided mother w/ manual pump. Mother states baby cluster fed.  Mom encouraged to feed baby 8-12 times/24 hours and with feeding cues.  Reviewed spoon feeding and STS if baby does not wake to feed. Reviewed engorgement care and monitoring voids/stools.     Maternal Data    Feeding Feeding Type: Breast Fed Length of feed: 0 min  LATCH Score Latch: Repeated attempts needed to sustain latch, nipple held in mouth throughout feeding, stimulation needed to elicit sucking reflex.  Audible Swallowing: None              Interventions    Lactation Tools Discussed/Used     Consult Status Consult Status: Complete    Hardie PulleyBerkelhammer, Fallynn Gravett Boschen 08/12/2017, 9:32 AM

## 2017-08-12 NOTE — Discharge Summary (Signed)
OB Discharge Summary     Patient Name: Kelly Watts DOB: May 27, 1995 MRN: [id number]  Date of admission: 08/11/2017 Delivering MD: Pincus LargePHELPS, JAZMA Y   Date of discharge: 08/12/2017  Admitting diagnosis: INDUCTION Intrauterine pregnancy: 4240w1d     Secondary diagnosis:  Active Problems:   Rh negative status during pregnancy   Supervision of normal pregnancy   History of IUFD   SVD (spontaneous vaginal delivery)  Additional problems: none     Discharge diagnosis: Term Pregnancy Delivered                                                                                                Post partum procedures:none  Augmentation: AROM and Pitocin  Complications: None  Hospital course:  Induction of Labor With Vaginal Delivery   22 y.o. yo G2P1101 at 4140w1d was admitted to the hospital 08/11/2017 for induction of labor.  Indication for induction: h/o IUFD.  Patient had an uncomplicated labor course as follows: Membrane Rupture Time/Date: 1:20 PM ,08/11/2017   Intrapartum Procedures: Episiotomy: None [1]                                         Lacerations:  None [1]  Patient had delivery of a Viable infant.  Information for the patient's newborn:  Siri ColeStone, Girl Deshawn [[id number]][[id number]]  Delivery Method: Vaginal, Spontaneous(Filed from Delivery Summary)   08/11/2017  Details of delivery can be found in separate delivery note.  Patient had a routine postpartum course. She is requesting d/c today if possible, understands if peds wants baby to stay, then she will stay another night as well. Patient is discharged home 08/12/17.  Physical exam  Vitals:   08/11/17 1846 08/11/17 1930 08/11/17 2030 08/12/17 0043  BP: (!) 110/52 115/63 (!) 102/58 (!) 103/50  Pulse: 70 68 95 68  Resp: 18 18 16 14   Temp: 98.3 F (36.8 C) 97.8 F (36.6 C) 97.9 F (36.6 C) 98 F (36.7 C)  TempSrc: Oral Oral Oral Oral  SpO2:  97% 98% 96%  Weight:      Height:       General: alert, cooperative and no  distress Lochia: appropriate Uterine Fundus: firm Incision: N/A DVT Evaluation: No evidence of DVT seen on physical exam. Negative Homan's sign. No cords or calf tenderness. No significant calf/ankle edema. Labs: Lab Results  Component Value Date   WBC 10.5 08/11/2017   HGB 9.9 (L) 08/11/2017   HCT 30.2 (L) 08/11/2017   MCV 88.0 08/11/2017   PLT 223 08/11/2017   CMP Latest Ref Rng & Units 10/01/2016  Glucose 65 - 99 mg/dL 88  BUN 6 - 20 mg/dL 7  Creatinine 4.690.44 - 6.291.00 mg/dL 5.280.64  Sodium 413135 - 244145 mmol/L 133(L)  Potassium 3.5 - 5.1 mmol/L 3.4(L)  Chloride 101 - 111 mmol/L 105  CO2 22 - 32 mmol/L 18(L)  Calcium 8.9 - 10.3 mg/dL 7.7(L)  Total Protein 6.5 - 8.1 g/dL 6.3(L)  Total Bilirubin 0.3 - 1.2 mg/dL 0.8  Alkaline Phos 38 - 126 U/L 47  AST 15 - 41 U/L 15  ALT 14 - 54 U/L 17    Discharge instruction: per After Visit Summary and "Baby and Me Booklet".  After visit meds:  Allergies as of 08/12/2017      Reactions   Penicillins Hives   Has patient had a PCN reaction causing immediate rash, facial/tongue/throat swelling, SOB or lightheadedness with hypotension: Yes Has patient had a PCN reaction causing severe rash involving mucus membranes or skin necrosis: No Has patient had a PCN reaction that required hospitalization: No Has patient had a PCN reaction occurring within the last 10 years: No If all of the above answers are "NO", then may proceed with Cephalosporin use.   Sulfa Antibiotics Hives      Medication List    STOP taking these medications   calcium carbonate 500 MG chewable tablet Commonly known as:  TUMS - dosed in mg elemental calcium   meclizine 25 MG tablet Commonly known as:  ANTIVERT     TAKE these medications   ibuprofen 600 MG tablet Commonly known as:  ADVIL,MOTRIN Take 1 tablet (600 mg total) by mouth every 6 (six) hours as needed for mild pain, moderate pain or cramping.   PRENATAL VITAMIN PO Take by mouth daily.       Diet: routine  diet  Activity: Advance as tolerated. Pelvic rest for 6 weeks.   Outpatient follow up:4-6wks Follow up Appt:No future appointments. Follow up Visit:No follow-ups on file.  Postpartum contraception: abstinence until nexplanon  Newborn Data: Live born female  Birth Weight: 7 lb 12.5 oz (3530 g) APGAR: 8, 9  Newborn Delivery   Birth date/time:  08/11/2017 16:49:00 Delivery type:  Vaginal, Spontaneous     Baby Feeding: Breast Disposition:home with mother pending d/c from peds   08/12/2017 Cheral Marker, CNM

## 2017-08-13 NOTE — Lactation Note (Signed)
This note was copied from a baby's chart. Lactation Consultation Note  Patient Name: Girl Valentino HueCourtney Judy ZOXWR'UToday's Date: 08/13/2017  Mom states baby is feeding well and no questions or concerns this morning.  Discharge instructions given including engorgement treatment.  Lactation outpatient services and support information reviewed and encouraged prn.   Maternal Data    Feeding Feeding Type: Breast Fed Length of feed: 30 min  LATCH Score                   Interventions    Lactation Tools Discussed/Used     Consult Status      Huston FoleyMOULDEN, Roxy Mastandrea S 08/13/2017, 8:12 AM

## 2017-08-13 NOTE — Discharge Summary (Addendum)
OB Discharge Summary     Patient Name: Kelly Watts DOB: 09-Mar-1996 MRN: [id number]  Date of admission: 08/11/2017 Delivering MD: Pincus Large   Date of discharge: 08/13/2017  Admitting diagnosis: INDUCTION Intrauterine pregnancy: [redacted]w[redacted]d     Secondary diagnosis:  Active Problems:   Rh negative status during pregnancy   Supervision of normal pregnancy   History of IUFD   SVD (spontaneous vaginal delivery)  Additional problems: none     Discharge diagnosis: Term Pregnancy Delivered                                                                                                Post partum procedures:none  Augmentation: AROM and Pitocin  Complications: none  Hospital course:  Induction of Labor With Vaginal Delivery   22 y.o. yo G2P1101 at [redacted]w[redacted]d was admitted to the hospital 08/11/2017 for induction of labor.  Indication for induction: h/o IUFD.  Patient had an uncomplicated labor course as follows: Membrane Rupture Time/Date: 1:20 PM ,08/11/2017   Intrapartum Procedures: Episiotomy: None [1]                                         Lacerations:  None [1]  Patient had delivery of a Viable infant.  Information for the patient's newborn:  [name], [name] [[id number]]  Delivery Method: Vaginal, Spontaneous(Filed from Delivery Summary)   08/11/2017  Details of delivery can be found in separate delivery note.  Patient had a routine postpartum course. Patient is discharged home 08/13/17.  Physical exam  Vitals:   08/12/17 0043 08/12/17 0808 08/12/17 1841 08/13/17 0527  BP: (!) 103/50 106/66 111/67 (!) 111/48  Pulse: 68 (!) 59 76 (!) 59  Resp: 14 18 18 17   Temp: 98 F (36.7 C) 97.6 F (36.4 C) 97.7 F (36.5 C) 98.3 F (36.8 C)  TempSrc: Oral Oral Oral Oral  SpO2: 96%     Weight:      Height:       General: alert and no distress Lochia: appropriate Uterine Fundus: firm Incision: N/A DVT Evaluation: No evidence of DVT seen on physical exam. Labs: Lab Results   Component Value Date   WBC 10.5 08/11/2017   HGB 9.9 (L) 08/11/2017   HCT 30.2 (L) 08/11/2017   MCV 88.0 08/11/2017   PLT 223 08/11/2017   CMP Latest Ref Rng & Units 10/01/2016  Glucose 65 - 99 mg/dL 88  BUN 6 - 20 mg/dL 7  Creatinine 7.25 - 3.66 mg/dL 4.40  Sodium 347 - 425 mmol/L 133(L)  Potassium 3.5 - 5.1 mmol/L 3.4(L)  Chloride 101 - 111 mmol/L 105  CO2 22 - 32 mmol/L 18(L)  Calcium 8.9 - 10.3 mg/dL 7.7(L)  Total Protein 6.5 - 8.1 g/dL 6.3(L)  Total Bilirubin 0.3 - 1.2 mg/dL 0.8  Alkaline Phos 38 - 126 U/L 47  AST 15 - 41 U/L 15  ALT 14 - 54 U/L 17    Discharge instruction: per After Visit Summary and "Baby and Me  Booklet".  After visit meds:  Allergies as of 08/13/2017      Reactions   Penicillins Hives   Has patient had a PCN reaction causing immediate rash, facial/tongue/throat swelling, SOB or lightheadedness with hypotension: Yes Has patient had a PCN reaction causing severe rash involving mucus membranes or skin necrosis: No Has patient had a PCN reaction that required hospitalization: No Has patient had a PCN reaction occurring within the last 10 years: No If all of the above answers are "NO", then may proceed with Cephalosporin use.   Sulfa Antibiotics Hives      Medication List    STOP taking these medications   calcium carbonate 500 MG chewable tablet Commonly known as:  TUMS - dosed in mg elemental calcium   meclizine 25 MG tablet Commonly known as:  ANTIVERT     TAKE these medications   ibuprofen 600 MG tablet Commonly known as:  ADVIL,MOTRIN Take 1 tablet (600 mg total) by mouth every 6 (six) hours as needed for mild pain, moderate pain or cramping.   PRENATAL VITAMIN PO Take by mouth daily.       Diet: routine diet  Activity: Advance as tolerated. Pelvic rest for 6 weeks.   Outpatient follow up: 4-6 weeks Follow up Appt: Future Appointments  Date Time Provider Department Center  09/18/2017 11:00 AM Cheral MarkerBooker, Kimberly R, CNM [facility]  [facility]   Follow up Visit:No follow-ups on file.  Postpartum contraception: Abstinence until Nexpalnon  Newborn Data: Live born female  Birth Weight: 7 lb 12.5 oz (3530 g) APGAR: 8, 9  Newborn Delivery   Birth date/time:  08/11/2017 16:49:00 Delivery type:  Vaginal, Spontaneous     Baby Feeding: Breast Disposition:home with mother   08/13/2017 Wendee Beaversavid J McMullen, DO  OB FELLOW DISCHARGE ATTESTATION  I have seen and examined this patient and agree with above documentation in the resident's note.   Frederik PearJulie P Degele, MD OB Fellow 7:07 AM

## 2017-08-14 LAB — TYPE AND SCREEN
ABO/RH(D): O NEG
ANTIBODY SCREEN: POSITIVE
UNIT DIVISION: 0
Unit division: 0

## 2017-08-14 LAB — BPAM RBC
BLOOD PRODUCT EXPIRATION DATE: 201905022359
Blood Product Expiration Date: 201905072359
UNIT TYPE AND RH: 9500
Unit Type and Rh: 9500

## 2017-08-17 ENCOUNTER — Encounter: Payer: Self-pay | Admitting: *Deleted

## 2017-08-17 ENCOUNTER — Telehealth: Payer: Self-pay | Admitting: *Deleted

## 2017-09-18 ENCOUNTER — Encounter: Payer: Self-pay | Admitting: Women's Health

## 2017-09-18 ENCOUNTER — Telehealth: Payer: Self-pay | Admitting: *Deleted

## 2017-09-18 ENCOUNTER — Ambulatory Visit (INDEPENDENT_AMBULATORY_CARE_PROVIDER_SITE_OTHER): Payer: 59 | Admitting: Women's Health

## 2017-09-18 NOTE — Telephone Encounter (Signed)
Spoke with patient and she states that she has pain likes she is in labor again after her exam. She denies any bleeding. Spoke with Selena Batten about patient and she recommended that she take ibuprofen and drink plenty of water. Patient agreeable and had no further questions or concerns.

## 2017-09-18 NOTE — Patient Instructions (Signed)
NO SEX UNTIL AFTER YOU GET YOUR BIRTH CONTROL   Etonogestrel implant What is this medicine? ETONOGESTREL (et oh noe JES trel) is a contraceptive (birth control) device. It is used to prevent pregnancy. It can be used for up to 3 years. This medicine may be used for other purposes; ask your health care provider or pharmacist if you have questions. COMMON BRAND NAME(S): Implanon, Nexplanon What should I tell my health care provider before I take this medicine? They need to know if you have any of these conditions: -abnormal vaginal bleeding -blood vessel disease or blood clots -cancer of the breast, cervix, or liver -depression -diabetes -gallbladder disease -headaches -heart disease or recent heart attack -high blood pressure -high cholesterol -kidney disease -liver disease -renal disease -seizures -tobacco smoker -an unusual or allergic reaction to etonogestrel, other hormones, anesthetics or antiseptics, medicines, foods, dyes, or preservatives -pregnant or trying to get pregnant -breast-feeding How should I use this medicine? This device is inserted just under the skin on the inner side of your upper arm by a health care professional. Talk to your pediatrician regarding the use of this medicine in children. Special care may be needed. Overdosage: If you think you have taken too much of this medicine contact a poison control center or emergency room at once. NOTE: This medicine is only for you. Do not share this medicine with others. What if I miss a dose? This does not apply. What may interact with this medicine? Do not take this medicine with any of the following medications: -amprenavir -bosentan -fosamprenavir This medicine may also interact with the following medications: -barbiturate medicines for inducing sleep or treating seizures -certain medicines for fungal infections like ketoconazole and itraconazole -grapefruit juice -griseofulvin -medicines to treat  seizures like carbamazepine, felbamate, oxcarbazepine, phenytoin, topiramate -modafinil -phenylbutazone -rifampin -rufinamide -some medicines to treat HIV infection like atazanavir, indinavir, lopinavir, nelfinavir, tipranavir, ritonavir -St. John's wort This list may not describe all possible interactions. Give your health care provider a list of all the medicines, herbs, non-prescription drugs, or dietary supplements you use. Also tell them if you smoke, drink alcohol, or use illegal drugs. Some items may interact with your medicine. What should I watch for while using this medicine? This product does not protect you against HIV infection (AIDS) or other sexually transmitted diseases. You should be able to feel the implant by pressing your fingertips over the skin where it was inserted. Contact your doctor if you cannot feel the implant, and use a non-hormonal birth control method (such as condoms) until your doctor confirms that the implant is in place. If you feel that the implant may have broken or become bent while in your arm, contact your healthcare provider. What side effects may I notice from receiving this medicine? Side effects that you should report to your doctor or health care professional as soon as possible: -allergic reactions like skin rash, itching or hives, swelling of the face, lips, or tongue -breast lumps -changes in emotions or moods -depressed mood -heavy or prolonged menstrual bleeding -pain, irritation, swelling, or bruising at the insertion site -scar at site of insertion -signs of infection at the insertion site such as fever, and skin redness, pain or discharge -signs of pregnancy -signs and symptoms of a blood clot such as breathing problems; changes in vision; chest pain; severe, sudden headache; pain, swelling, warmth in the leg; trouble speaking; sudden numbness or weakness of the face, arm or leg -signs and symptoms of liver injury like dark yellow   or brown  urine; general ill feeling or flu-like symptoms; light-colored stools; loss of appetite; nausea; right upper belly pain; unusually weak or tired; yellowing of the eyes or skin -unusual vaginal bleeding, discharge -signs and symptoms of a stroke like changes in vision; confusion; trouble speaking or understanding; severe headaches; sudden numbness or weakness of the face, arm or leg; trouble walking; dizziness; loss of balance or coordination Side effects that usually do not require medical attention (report to your doctor or health care professional if they continue or are bothersome): -acne -back pain -breast pain -changes in weight -dizziness -general ill feeling or flu-like symptoms -headache -irregular menstrual bleeding -nausea -sore throat -vaginal irritation or inflammation This list may not describe all possible side effects. Call your doctor for medical advice about side effects. You may report side effects to FDA at 1-800-FDA-1088. Where should I keep my medicine? This drug is given in a hospital or clinic and will not be stored at home. NOTE: This sheet is a summary. It may not cover all possible information. If you have questions about this medicine, talk to your doctor, pharmacist, or health care provider.  2018 Elsevier/Gold Standard (2015-11-08 11:19:22)  

## 2017-09-18 NOTE — Progress Notes (Signed)
   POSTPARTUM VISIT Patient name: Kelly Watts MRN [id number]  Date of birth: 06-19-1995 Chief Complaint:   Postpartum Care  History of Present Illness:   Kelly Watts is a 22 y.o. G66P1101 Caucasian female being seen today for a postpartum visit. She is 5 weeks postpartum following a spontaneous vaginal delivery at 39.1 gestational weeks after IOL d/t h/o IUFD. Anesthesia: epidural. I have fully reviewed the prenatal and intrapartum course. Pregnancy complicated by h/o IUFD, h/o PTB. Postpartum course has been uncomplicated. Bleeding no bleeding. Bowel function is normal. Bladder function is normal.  Patient is not sexually active. Last sexual activity: prior to birth of baby.  Contraception method is wants nexplanon.  [place] Postpartum Depression Screening: negative. Score 3.   Last pap 01/20/17.  Results were normal .  No LMP recorded.  Baby's course has been uncomplicated. Baby is feeding by breast.  Review of Systems:   Pertinent items are noted in HPI Denies Abnormal vaginal discharge w/ itching/odor/irritation, headaches, visual changes, shortness of breath, chest pain, abdominal pain, severe nausea/vomiting, or problems with urination or bowel movements. Pertinent History Reviewed:  Reviewed past medical,surgical, obstetrical and family history.  Reviewed problem list, medications and allergies. OB History  Gravida Para Term Preterm AB Living  SAB TAB Ectopic Multiple Live Births        0 1    # Outcome Date GA Lbr Len/2nd Weight Sex Delivery Anes PTL Lv  2 Term 08/11/17 [redacted]w[redacted]d 15:19 / 01:00 7 lb 12.5 oz (3.53 kg) F Vag-Spont EPI  LIV  1 Preterm 07/25/12 [redacted]w[redacted]d 07:35 / 00:04 15 oz (0.425 kg) F Vag-Spont None  FD   Physical Assessment:   Vitals:   09/18/17 1121  BP: 100/60  Pulse: 98  Weight: 108 lb (49 kg)  Height:  (1.6 m)  Body mass index is 19.13 kg/m.       Physical Examination:   General appearance: alert, well appearing, and in no  distress  Mental status: alert, oriented to person, place, and time  Skin: warm & dry   Cardiovascular: normal heart rate noted   Respiratory: normal respiratory effort, no distress   Breasts: deferred, no complaints   Abdomen: soft, non-tender   Pelvic: VULVA: normal appearing vulva with no masses, tenderness or lesions, UTERUS: uterus is normal size, shape, consistency and nontender  Rectal: no hemorrhoids  Extremities: no edema       No results found for this or any previous visit (from the past 24 hour(s)).  Assessment & Plan:  1) Postpartum exam 2) 5 wks s/p SVB after IOL for h/o IUFD 3) Breastfeeding 4) Depression screening 5) Contraception counseling, pt prefers Nexplanon, abstinence until insertion  Meds: No orders of the defined types were placed in this encounter.   Follow-up: Return for next week for , Nexplanon insertion.   No orders of the defined types were placed in this encounter.   Cheral Marker CNM, [facility] 09/18/2017 11:37 AM

## 2017-09-21 ENCOUNTER — Other Ambulatory Visit: Payer: Self-pay | Admitting: Physician Assistant

## 2017-09-21 ENCOUNTER — Ambulatory Visit (HOSPITAL_COMMUNITY)
Admission: RE | Admit: 2017-09-21 | Discharge: 2017-09-21 | Disposition: A | Discharge status: Home / Self Care | Payer: 59 | Source: Ambulatory Visit | Attending: Physician Assistant | Admitting: Physician Assistant

## 2017-09-21 ENCOUNTER — Other Ambulatory Visit: Payer: Self-pay

## 2017-09-21 ENCOUNTER — Ambulatory Visit (INDEPENDENT_AMBULATORY_CARE_PROVIDER_SITE_OTHER): Payer: 59 | Admitting: Physician Assistant

## 2017-09-21 ENCOUNTER — Encounter: Payer: Self-pay | Admitting: Physician Assistant

## 2017-09-21 VITALS — BP 110/64 | HR 72 | Temp 97.6°F | Ht 63.0 in | Wt 106.1 lb

## 2017-09-21 DIAGNOSIS — N2889 Other specified disorders of kidney and ureter: Secondary | ICD-10-CM | POA: Diagnosis not present

## 2017-09-21 DIAGNOSIS — R109 Unspecified abdominal pain: Secondary | ICD-10-CM | POA: Diagnosis not present

## 2017-09-21 DIAGNOSIS — N200 Calculus of kidney: Secondary | ICD-10-CM | POA: Diagnosis not present

## 2017-09-21 LAB — URINALYSIS, ROUTINE W REFLEX MICROSCOPIC
Bilirubin Urine: NEGATIVE
Glucose, UA: NEGATIVE
Ketones, ur: NEGATIVE
Nitrite: NEGATIVE
PROTEIN: NEGATIVE
Specific Gravity, Urine: 1.025 (ref 1.001–1.03)
pH: 5.5 (ref 5.0–8.0)

## 2017-09-21 LAB — COMPLETE METABOLIC PANEL WITH GFR
AG Ratio: 1.7 (calc) (ref 1.0–2.5)
ALT: 13 U/L (ref 6–29)
AST: 13 U/L (ref 10–30)
Albumin: 4.4 g/dL (ref 3.6–5.1)
Alkaline phosphatase (APISO): 136 U/L — ABNORMAL HIGH (ref 33–115)
BUN: 13 mg/dL (ref 7–25)
CALCIUM: 10.7 mg/dL — AB (ref 8.6–10.2)
CO2: 28 mmol/L (ref 20–32)
Chloride: 106 mmol/L (ref 98–110)
Creat: 0.91 mg/dL (ref 0.50–1.10)
GFR, EST AFRICAN AMERICAN: 104 mL/min/{1.73_m2} (ref >=60)
GFR, EST NON AFRICAN AMERICAN: 90 mL/min/{1.73_m2} (ref >=60)
GLUCOSE: 96 mg/dL (ref 65–99)
Globulin: 2.6 g/dL (calc) (ref 1.9–3.7)
Potassium: 3.9 mmol/L (ref 3.5–5.3)
Sodium: 142 mmol/L (ref 135–146)
TOTAL PROTEIN: 7 g/dL (ref 6.1–8.1)
Total Bilirubin: 0.6 mg/dL (ref 0.2–1.2)

## 2017-09-21 LAB — CBC WITH DIFFERENTIAL/PLATELET
BASOS ABS: 51 {cells}/uL (ref 0–200)
Basophils Relative: 0.9 %
EOS ABS: 108 {cells}/uL (ref 15–500)
EOS PCT: 1.9 %
HEMATOCRIT: 40.1 % (ref 35.0–45.0)
HEMOGLOBIN: 13.5 g/dL (ref 11.7–15.5)
LYMPHS ABS: 2286 {cells}/uL (ref 850–3900)
MCH: 27.8 pg (ref 27.0–33.0)
MCHC: 33.7 g/dL (ref 32.0–36.0)
MCV: 82.7 fL (ref 80.0–100.0)
MPV: 10.3 fL (ref 7.5–12.5)
Monocytes Relative: 9.7 %
Neutro Abs: 2702 cells/uL (ref 1500–7800)
Neutrophils Relative %: 47.4 %
Platelets: 261 10*3/uL (ref 140–400)
RBC: 4.85 10*6/uL (ref 3.80–5.10)
RDW: 14.1 % (ref 11.0–15.0)
Total Lymphocyte: 40.1 %
WBC mixed population: 553 cells/uL (ref 200–950)
WBC: 5.7 10*3/uL (ref 3.8–10.8)

## 2017-09-21 LAB — MICROSCOPIC MESSAGE

## 2017-09-21 MED ORDER — IOPAMIDOL (ISOVUE-300) INJECTION 61%
100.0000 mL | Freq: Once | INTRAVENOUS | Status: AC | PRN
  Administered 2017-09-21: 100 mL via INTRAVENOUS

## 2017-09-21 NOTE — Progress Notes (Signed)
Patient ID: Kelly Watts MRN: [id number], DOB: 1995/12/14, 22 y.o. Date of Encounter: @  Chief Complaint:  Chief Complaint  Patient presents with  . Abdominal Pain    Onset Friday. States pain were excrucitating     HPI: 22 y.o. year old female  presents with above.    I reviewed that she recently had a baby / gave birth on 08/11/2017.  States that she did not require C-section with that.  States that this past Friday she went to her OB/GYN for her 6-week checkup. Reports that she was told that everything looked good on that exam from an OB standpoint. States that when the OB/GYN did their exam and was palpating abdomen she did feel some tenderness towards the right upper quadrant. States that when she was on her way home from that appointment, the pain increased in her right upper abdomen.  She also developed nausea and had some diarrhea.  Reports that by the time she got home on Friday, the pain became severe at a 10/10.   States that symptoms continued to be significant for about 3 hours but then symptoms eased. Since then, has continued to have some constant discomfort but currently at about a 2/10.   States that still the nausea comes and goes and has been intermittent since Friday.   States that she never vomited.   Has had minimal diarrhea since that episode on Friday. Has stayed with a bland diet and avoided fried foods and spicy foods since Friday. Did eat some oatmeal this morning around 8 AM.  Otherwise fasting.  Reviewed that she has had kidney Hel[place] in the past. She has not had her appendix removed, has not had gallbladder removed.  Discussed different areas that may feel pain from a kidney Spranger as the [place] moves.  She states that she never felt any pain in her back and has not felt the pain move down towards her pelvis.  Has seen no visual hematuria.  No dysuria.  Has had no known fevers or chills.  No other additional symptoms.  No other concerns to  address.  She reports that she had not been aware of any significant symptoms prior to Friday.  Had not been noticing any abdominal pain or nausea prior to Friday even after eating.  She says that generally she does not eat a whole lot at a time and does not eat a large fatty meal often----we discussed that usually people with gallbladder issues will feel achy discomfort/nausea after eating fatty meal.  Says that she really has not had a large fatty meal recently.   Past Medical History:  Diagnosis Date  . Encounter for Nexplanon removal 11/16/2015  . History of kidney stones   . History of kidney stones   . Kidney infection   . Nexplanon insertion 11/02/2012   Inserted left arm 11/02/12  . Patient underweight      Home Meds: Outpatient Medications Prior to Visit  Medication Sig Dispense Refill  . ibuprofen (ADVIL,MOTRIN) 600 MG tablet Take 1 tablet (600 mg total) by mouth every 6 (six) hours as needed for mild pain, moderate pain or cramping. 30 tablet 0  . Prenatal Vit-Fe Fumarate-FA (PRENATAL VITAMIN PO) Take by mouth daily.     No facility-administered medications prior to visit.     Allergies:  Allergies  Allergen Reactions  . Penicillins Hives    Has patient had a PCN reaction causing immediate rash, facial/tongue/throat swelling, SOB or lightheadedness with hypotension:  Yes Has patient had a PCN reaction causing severe rash involving mucus membranes or skin necrosis: No Has patient had a PCN reaction that required hospitalization: No Has patient had a PCN reaction occurring within the last 10 years: No If all of the above answers are "NO", then may proceed with Cephalosporin use.   . Sulfa Antibiotics Hives    Social History   Socioeconomic History  . Marital status: Single    Spouse name: Not on file  . Number of children: Not on file  . Years of education: Not on file  . Highest education level: Not on file  Occupational History  . Not on file  Social Needs  .  Financial resource strain: Not on file  . Food insecurity:    Worry: Not on file    Inability: Not on file  . Transportation needs:    Medical: Not on file    Non-medical: Not on file  Tobacco Use  . Smoking status: Former Smoker    Packs/day: 0.25    Years: 3.00    Pack years: 0.75    Types: Cigarettes    Last attempt to quit: 01/30/2016    Years since quitting: 1.6  . Smokeless tobacco: Never Used  Substance and Sexual Activity  . Alcohol use: No    Comment: occ  . Drug use: No  . Sexual activity: Not Currently    Birth control/protection: None  Lifestyle  . Physical activity:    Days per week: Not on file    Minutes per session: Not on file  . Stress: Not on file  Relationships  . Social connections:    Talks on phone: Not on file    Gets together: Not on file    Attends religious service: Not on file    Active member of club or organization: Not on file    Attends meetings of clubs or organizations: Not on file    Relationship status: Not on file  . Intimate partner violence:    Fear of current or ex partner: Not on file    Emotionally abused: Not on file    Physically abused: Not on file    Forced sexual activity: Not on file  Other Topics Concern  . Not on file  Social History Narrative  . Not on file    Family History  Problem Relation Age of Onset  . Coronary artery disease Paternal Grandfather   . Cancer Paternal Grandmother        breast  . Diabetes Maternal Grandmother   . Thyroid disease Maternal Grandmother   . Heart disease Maternal Grandmother   . Melanoma Maternal Grandfather      Review of Systems:  See HPI for pertinent ROS. All other ROS negative.    Physical Exam: Blood pressure 110/64, pulse 72, temperature 97.6 F (36.4 C), temperature source Oral, height  (1.6 m), weight 48.1 kg (106 lb 2 oz), SpO2 98 %, currently breastfeeding., Body mass index is 18.8 kg/m. General: Thin WF. Appears in no acute distress. Neck: Supple. No  thyromegaly. No lymphadenopathy. Lungs: Clear bilaterally to auscultation without wheezes, rales, or rhonchi. Breathing is unlabored. Heart: RRR with S1 S2. No murmurs, rubs, or gallops. Abdomen: Soft, non-distended with normoactive bowel sounds. No hepatomegaly. No rebound/guarding. No obvious abdominal masses. She has some tenderness with palpation in periumbilical region, right lower/mid abdomen and in RUQ.  Musculoskeletal:  Strength and tone normal for age. Extremities/Skin: Warm and dry.  Neuro: Alert and  oriented X 3. Moves all extremities spontaneously. Gait is normal. CNII-XII grossly in tact. Psych:  Responds to questions appropriately with a normal affect.     ASSESSMENT AND PLAN:  22 y.o. year old female with  1. Right sided abdominal pain - Urinalysis, Routine w reflex microscopic - COMPLETE METABOLIC PANEL WITH GFR - CBC with Differential/Platelet  Check urinalysis. Also check CBC and CMET stat. Also she is scheduled for CT abdomen pelvis and is sent directly to go to CT scan now. I will follow-up with her once I get that report. She left the office in stable condition and did not appear in distress.  Differential Includes: Kidney Behrend--- She has prior history of kidney Gladish.  Also given that she says that this pain was severe on Friday is consistent with kidney [place]. Appendicitis Cholelithiasis   Signed, [address] [place], [place], [facility] 09/21/2017 11:50 AM

## 2017-09-25 ENCOUNTER — Ambulatory Visit (INDEPENDENT_AMBULATORY_CARE_PROVIDER_SITE_OTHER): Payer: 59 | Admitting: Women's Health

## 2017-09-25 ENCOUNTER — Encounter: Payer: Self-pay | Admitting: Women's Health

## 2017-09-25 VITALS — BP 102/60 | HR 88 | Ht 63.0 in | Wt 107.8 lb

## 2017-09-25 DIAGNOSIS — Z3202 Encounter for pregnancy test, result negative: Secondary | ICD-10-CM | POA: Diagnosis not present

## 2017-09-25 DIAGNOSIS — Z30017 Encounter for initial prescription of implantable subdermal contraceptive: Secondary | ICD-10-CM

## 2017-09-25 DIAGNOSIS — Z3046 Encounter for surveillance of implantable subdermal contraceptive: Secondary | ICD-10-CM

## 2017-09-25 LAB — POCT URINE PREGNANCY: Preg Test, Ur: NEGATIVE

## 2017-09-25 MED ORDER — ETONOGESTREL 68 MG ~~LOC~~ IMPL
68.0000 mg | DRUG_IMPLANT | Freq: Once | SUBCUTANEOUS | Status: AC
  Administered 2017-09-25: 68 mg via SUBCUTANEOUS

## 2017-09-25 NOTE — Progress Notes (Signed)
   NEXPLANON INSERTION Patient name: Kelly Watts MRN [id number]  Date of birth: 1996-04-08 Subjective Findings:   Kelly Watts is a 22 y.o. G27P1101 Caucasian female being seen today for insertion of a Nexplanon.   No LMP recorded. (Menstrual status: Lactating). Last sexual intercourse was prior to birth of baby Last pap9/18/18. Results were:  normal  Risks/benefits/side effects of Nexplanon have been discussed and her questions have been answered.  Specifically, a failure rate of 05/998 has been reported, with an increased failure rate if pt takes St. John's Wort and/or antiseizure medicaitons.  She is aware of the common side effect of irregular bleeding, which the incidence of decreases over time. Signed copy of informed consent in chart.  Pertinent History Reviewed:   Reviewed past medical,surgical, social, obstetrical and family history.  Reviewed problem list, medications and allergies. Objective Findings & Procedure:    Vitals:   09/25/17 1010  BP: 102/60  Pulse: 88  Weight: 107 lb 12.8 oz (48.9 kg)  Height:  (1.6 m)  Body mass index is 19.1 kg/m.  Results for orders placed or performed in visit on 09/25/17 (from the past 24 hour(s))  POCT urine pregnancy   Collection Time: 09/25/17 10:33 AM  Result Value Ref Range   Preg Test, Ur Negative Negative     Time out was performed.  She is right-handed, so her left arm, approximately 10cm from the medial epicondyle and 3-5cm posterior to the sulcus, was cleansed with alcohol and anesthetized with 2cc of 2% Lidocaine.  The area was cleansed again with betadine and the Nexplanon was inserted per manufacturer's recommendations without difficulty.  3 steri-strips and pressure bandage were applied. The patient tolerated the procedure well.  Assessment & Plan:   1) Nexplanon insertion Pt was instructed to keep the area clean and dry, remove pressure bandage in 24 hours, and keep insertion site covered with the steri-strip  for 3-5 days.  Back up contraception was recommended for 2 weeks.  She was given a card indicating date Nexplanon was inserted and date it needs to be removed. Follow-up PRN problems.  Orders Placed This Encounter  Procedures  . POCT urine pregnancy    Follow-up: Return in about 1 year (around 09/26/2018) for Physical.  Cheral Marker CNM, WHNP-BC 09/25/2017 10:54 AM

## 2017-09-25 NOTE — Patient Instructions (Signed)

## 2017-11-09 ENCOUNTER — Ambulatory Visit (INDEPENDENT_AMBULATORY_CARE_PROVIDER_SITE_OTHER): Payer: 59 | Admitting: Physician Assistant

## 2017-11-09 ENCOUNTER — Other Ambulatory Visit: Payer: Self-pay

## 2017-11-09 ENCOUNTER — Encounter: Payer: Self-pay | Admitting: Physician Assistant

## 2017-11-09 VITALS — BP 100/68 | HR 70 | Temp 97.6°F | Resp 14 | Ht 63.0 in | Wt 111.8 lb

## 2017-11-09 DIAGNOSIS — K921 Melena: Secondary | ICD-10-CM

## 2017-11-09 LAB — CBC WITH DIFFERENTIAL/PLATELET
Basophils Absolute: 27 cells/uL (ref 0–200)
Basophils Relative: 0.4 %
EOS ABS: 61 {cells}/uL (ref 15–500)
Eosinophils Relative: 0.9 %
HEMATOCRIT: 37.7 % (ref 35.0–45.0)
Hemoglobin: 12.7 g/dL (ref 11.7–15.5)
Lymphs Abs: 2462 cells/uL (ref 850–3900)
MCH: 28.3 pg (ref 27.0–33.0)
MCHC: 33.7 g/dL (ref 32.0–36.0)
MCV: 84 fL (ref 80.0–100.0)
MPV: 9.9 fL (ref 7.5–12.5)
Monocytes Relative: 8.5 %
NEUTROS PCT: 54 %
Neutro Abs: 3672 cells/uL (ref 1500–7800)
PLATELETS: 334 10*3/uL (ref 140–400)
RBC: 4.49 10*6/uL (ref 3.80–5.10)
RDW: 15.2 % — AB (ref 11.0–15.0)
TOTAL LYMPHOCYTE: 36.2 %
WBC: 6.8 10*3/uL (ref 3.8–10.8)
WBCMIX: 578 {cells}/uL (ref 200–950)

## 2017-11-09 MED ORDER — HYDROCORTISONE ACETATE 25 MG RE SUPP: 25.0000 mg | Freq: Two times a day (BID) | RECTAL | 1 refills | Status: DC

## 2017-11-09 NOTE — Progress Notes (Signed)
Patient ID: Kelly Watts MRN: [id number], DOB: February 18, 1996, 22 y.o. Date of Encounter: @DATE @  Chief Complaint:  Chief Complaint  Patient presents with  . Blood In Stools    3 months     HPI: 22 y.o. year old female  presents with above.   She reports that for the last 3 months that sometimes when she has a bowel movement she sees bright red blood.   Reports that when she had her 6-week checkup with OB/GYN that she informed them of the hematochezia and that they did check for hemorrhoids and told her they saw no hemorrhoids and that they felt that symptoms would resolve. Reports that she has continued to see bright red blood at times so came in.  Says that she only sees the bright red blood when she has a bowel movement.   Says that what she has seen is bright red blood -- not melena.   Says that "sometimes it is blood like you would see when you have your menses.   Other times she only sees the blood when she wipes---on the toilet paper.  Reports that she has no constipation.  States that she does not have any straining with bowel movement and that the stool is not hard. She has felt no pain in the low abdomen/rectal area.     Past Medical History:  Diagnosis Date  . Encounter for Nexplanon removal 11/16/2015  . History of kidney stones   . History of kidney stones   . Kidney infection   . Nexplanon insertion 11/02/2012   Inserted left arm 11/02/12  . Patient underweight      Home Meds: Outpatient Medications Prior to Visit  Medication Sig Dispense Refill  . ibuprofen (ADVIL,MOTRIN) 600 MG tablet Take 1 tablet (600 mg total) by mouth every 6 (six) hours as needed for mild pain, moderate pain or cramping. 30 tablet 0  . Prenatal Vit-Fe Fumarate-FA (PRENATAL VITAMIN PO) Take by mouth daily.     No facility-administered medications prior to visit.     Allergies:  Allergies  Allergen Reactions  . Penicillins Hives    Has patient had a PCN reaction causing immediate  rash, facial/tongue/throat swelling, SOB or lightheadedness with hypotension: Yes Has patient had a PCN reaction causing severe rash involving mucus membranes or skin necrosis: No Has patient had a PCN reaction that required hospitalization: No Has patient had a PCN reaction occurring within the last 10 years: No If all of the above answers are "NO", then may proceed with Cephalosporin use.   . Sulfa Antibiotics Hives    Social History   Socioeconomic History  . Marital status: Single    Spouse name: Not on file  . Number of children: Not on file  . Years of education: Not on file  . Highest education level: Not on file  Occupational History  . Not on file  Social Needs  . Financial resource strain: Not on file  . Food insecurity:    Worry: Not on file    Inability: Not on file  . Transportation needs:    Medical: Not on file    Non-medical: Not on file  Tobacco Use  . Smoking status: Former Smoker    Packs/day: 0.25    Years: 3.00    Pack years: 0.75    Types: Cigarettes    Last attempt to quit: 01/30/2016    Years since quitting: 1.7  . Smokeless tobacco: Never Used  Substance and Sexual  Activity  . Alcohol use: No    Comment: occ  . Drug use: No  . Sexual activity: Not Currently    Birth control/protection: None  Lifestyle  . Physical activity:    Days per week: Not on file    Minutes per session: Not on file  . Stress: Not on file  Relationships  . Social connections:    Talks on phone: Not on file    Gets together: Not on file    Attends religious service: Not on file    Active member of club or organization: Not on file    Attends meetings of clubs or organizations: Not on file    Relationship status: Not on file  . Intimate partner violence:    Fear of current or ex partner: Not on file    Emotionally abused: Not on file    Physically abused: Not on file    Forced sexual activity: Not on file  Other Topics Concern  . Not on file  Social History  Narrative  . Not on file    Family History  Problem Relation Age of Onset  . Coronary artery disease Paternal Grandfather   . Cancer Paternal Grandmother        breast  . Diabetes Maternal Grandmother   . Thyroid disease Maternal Grandmother   . Heart disease Maternal Grandmother   . Melanoma Maternal Grandfather      Review of Systems:  See HPI for pertinent ROS. All other ROS negative.    Physical Exam: Blood pressure 100/68, pulse 70, temperature 97.6 F (36.4 C), temperature source Oral, resp. rate 14, height 5\' 3"  (1.6 m), weight 50.7 kg (111 lb 12.8 oz), SpO2 98 %, currently breastfeeding., Body mass index is 19.8 kg/m. General: WNWD WF. Appears in no acute distress. Neck: Supple. No thyromegaly. No lymphadenopathy. Lungs: Clear bilaterally to auscultation without wheezes, rales, or rhonchi. Breathing is unlabored. Heart: RRR with S1 S2. No murmurs, rubs, or gallops. Abdomen: Soft, non-tender, non-distended with normoactive bowel sounds. No hepatomegaly. No rebound/guarding. No obvious abdominal masses. Anoscopy: External exam appears normal. Anoscopy shows internal hemorrhoids but no sign of active bleeding or active inflammation. No mass, fissure, fistula on exam. Musculoskeletal:  Strength and tone normal for age. Extremities/Skin: Warm and dry.  Neuro: Alert and oriented X 3. Moves all extremities spontaneously. Gait is normal. CNII-XII grossly in tact. Psych:  Responds to questions appropriately with a normal affect.     ASSESSMENT AND PLAN:  22 y.o. year old female with  1. Hematochezia Check CBC to make sure she has no significant anemia secondary to significant blood loss. Will have her use Anusol suppository to treat internal hemorrhoids. Will schedule follow-up with GI to determine if additional evaluation warranted. - CBC with Differential/Platelet - hydrocortisone (ANUSOL-HC) 25 MG suppository; Place 1 suppository (25 mg total) rectally every 12 (twelve)  hours.  Dispense: 12 suppository; Refill: 1 - Ambulatory referral to Gastroenterology   Signed, Shon [place], [place], [facility] 11/09/2017 12:25 PM

## 2017-11-20 DIAGNOSIS — Z760 Encounter for issue of repeat prescription: Secondary | ICD-10-CM | POA: Diagnosis not present

## 2018-03-17 IMAGING — CR DG FOOT COMPLETE 3+V*R*
3 series · 3 of 3 positions shown · non-contrast
Comparison: 02/23/2008

CLINICAL DATA: Hit foot on metal [employer] 1 week ago. Lateral foot
pain. Initial encounter.

EXAM:
RIGHT FOOT COMPLETE - 3+ VIEW

[t foot ap right]
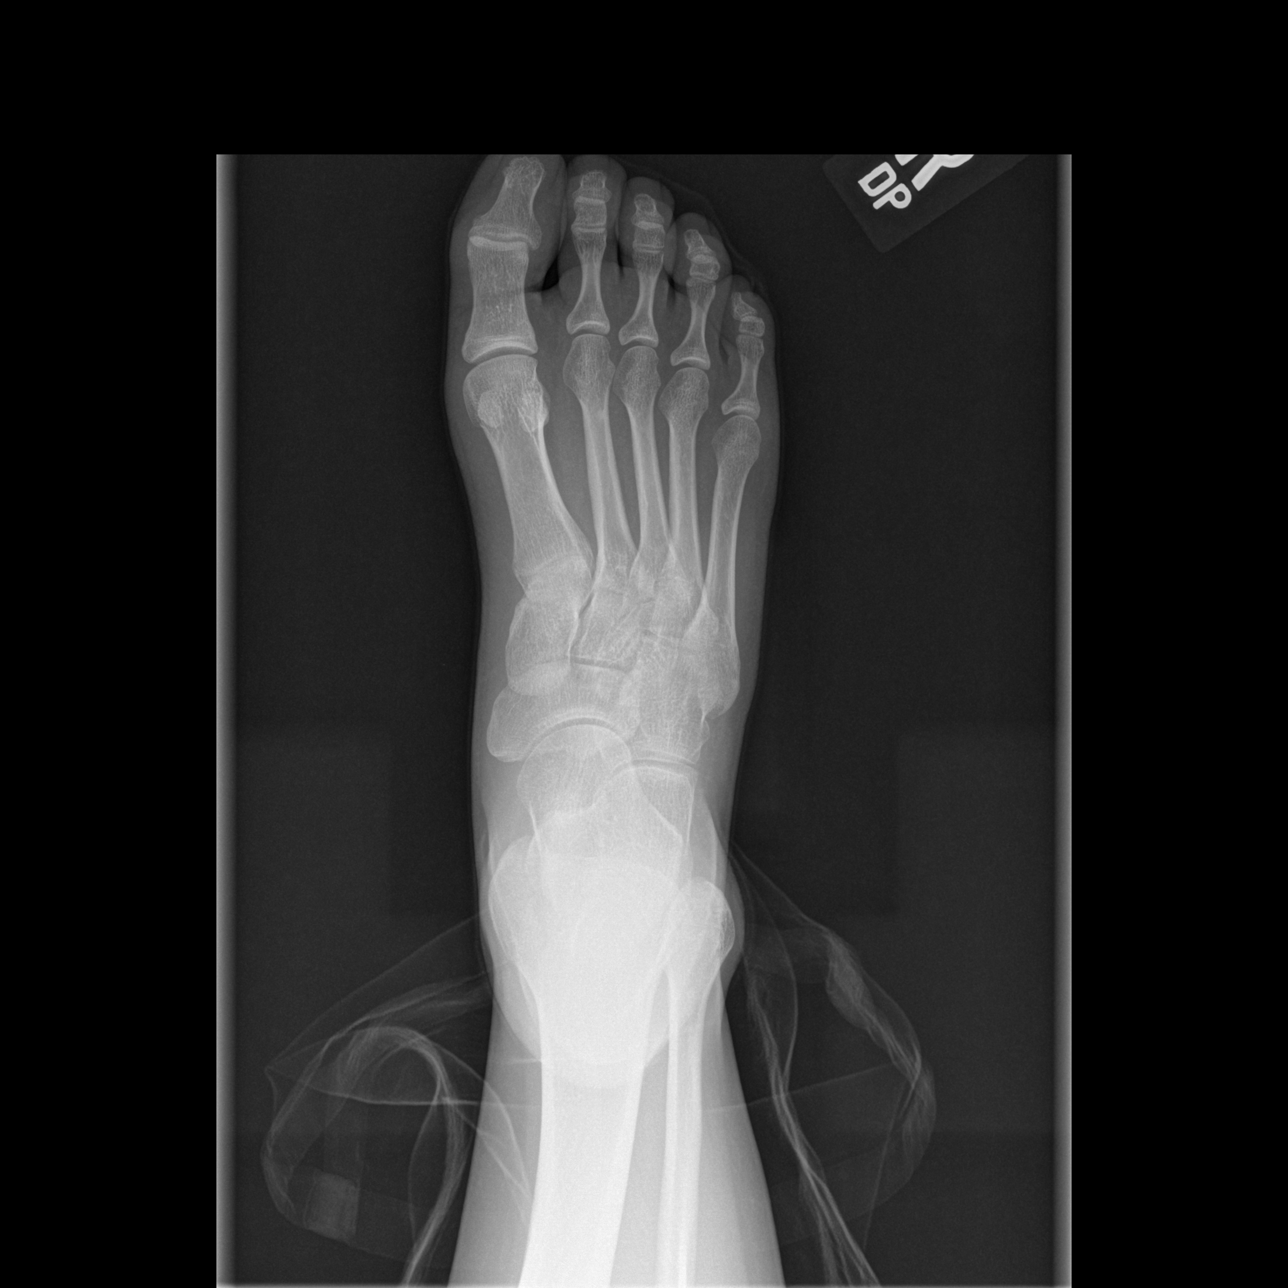

[t foot oblique right]
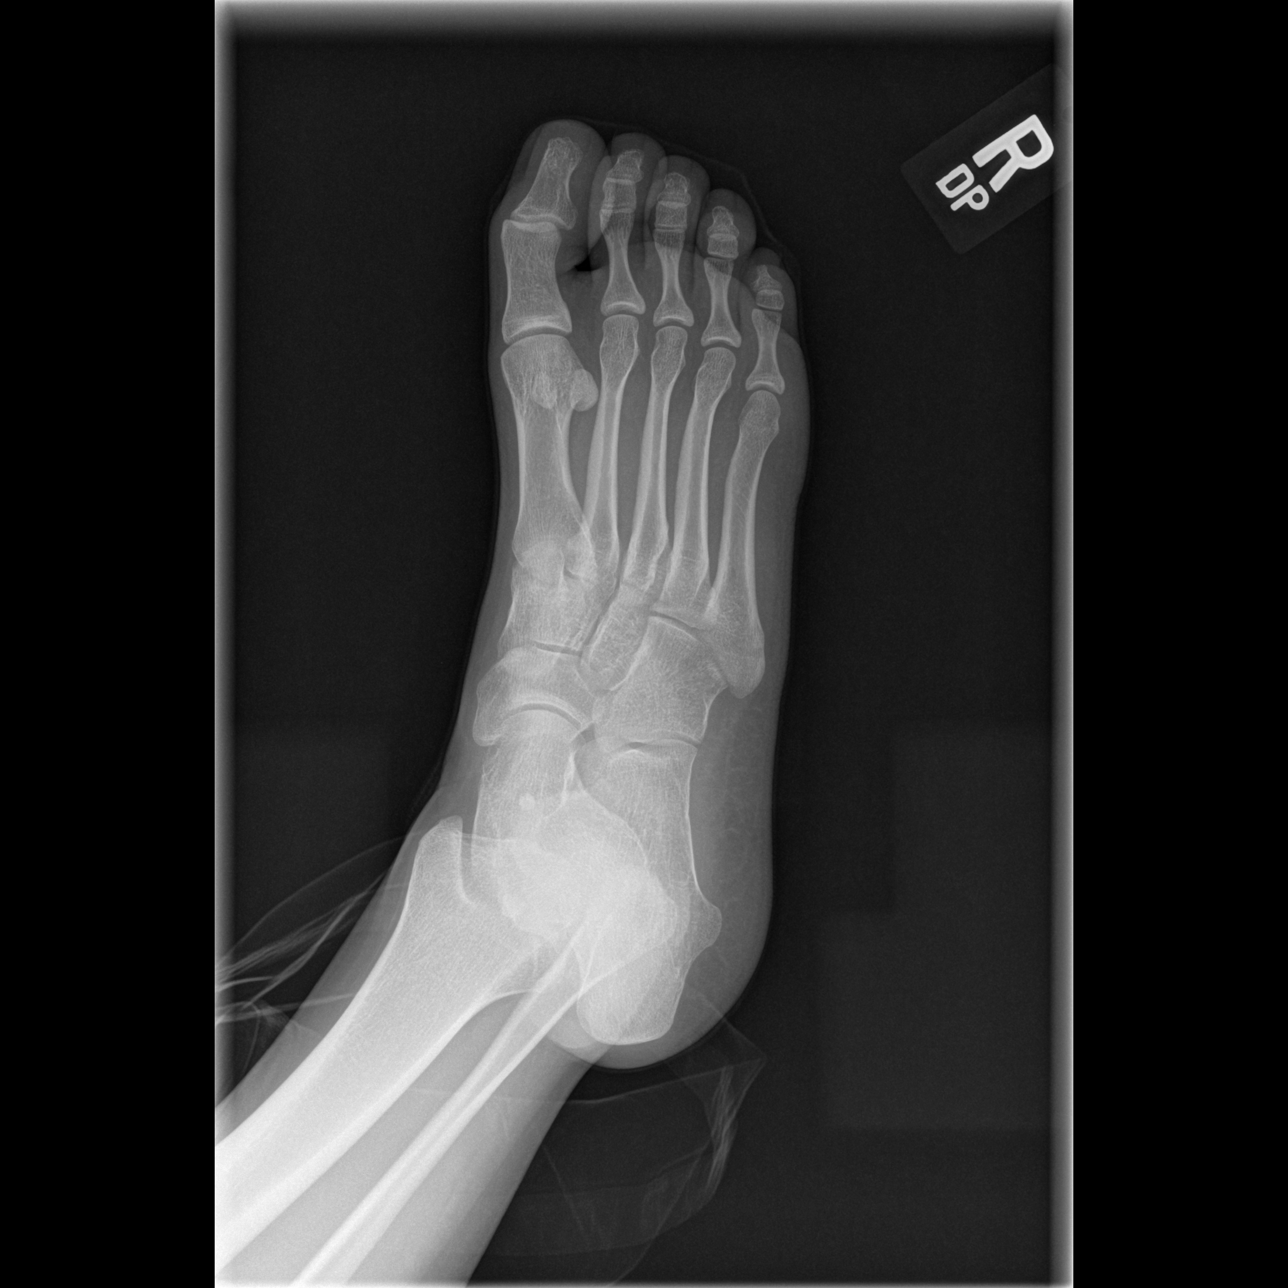

[t foot lat right]
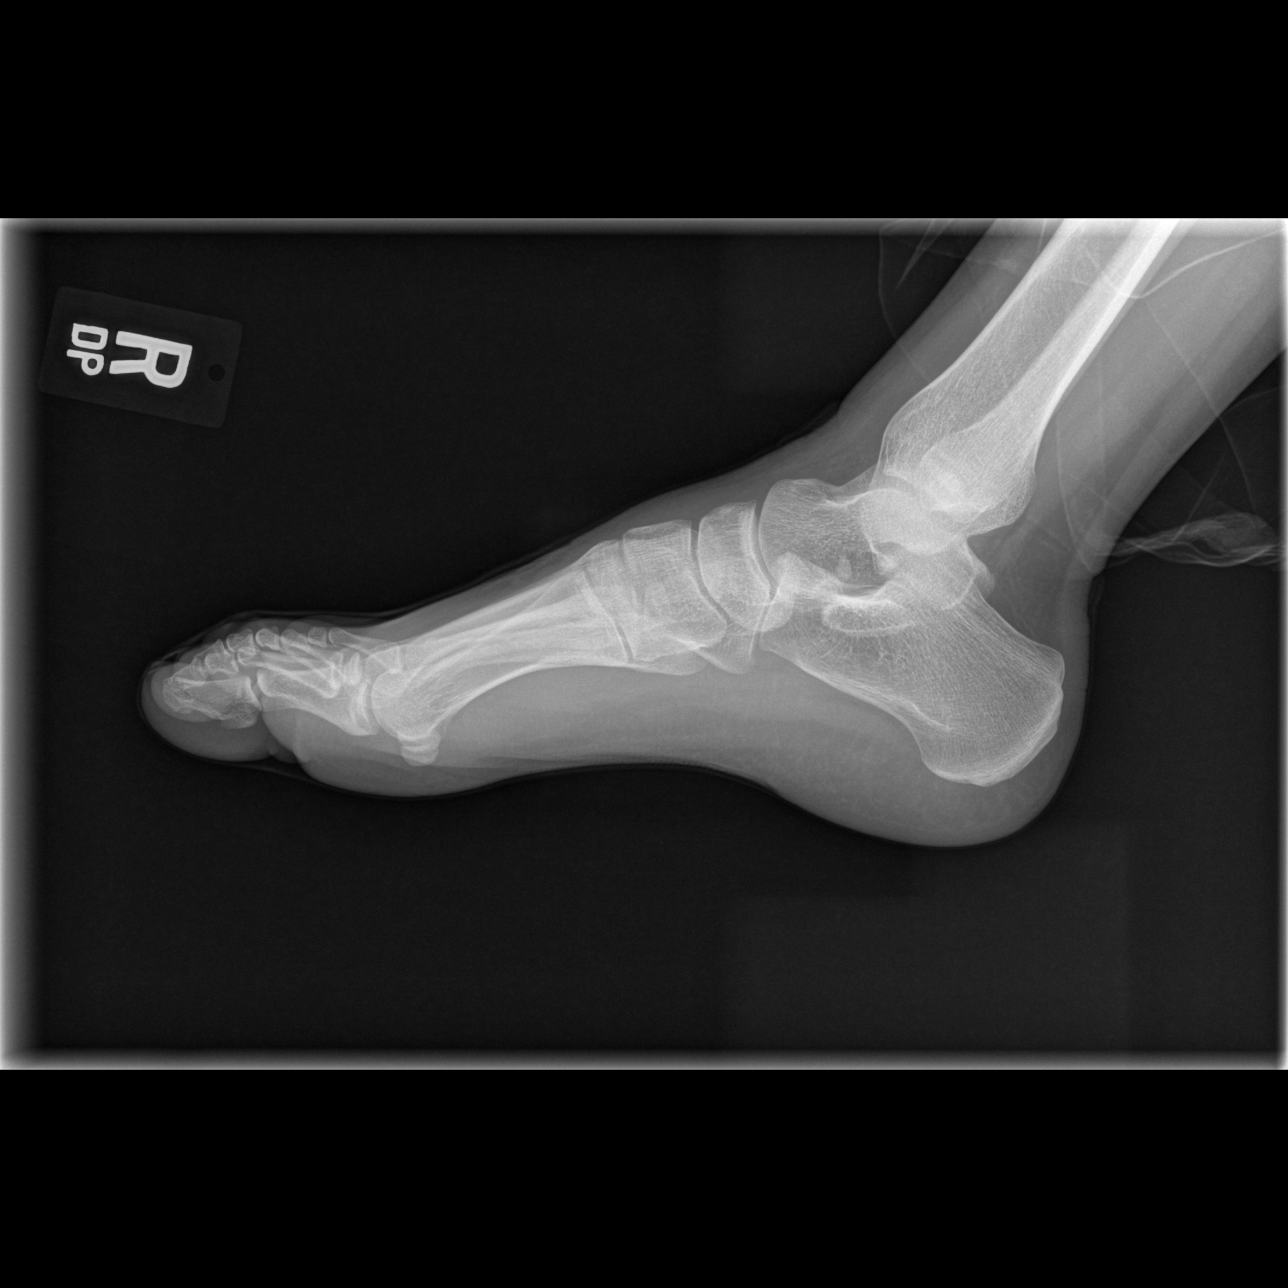

[3 of 3 positions shown; findings below may reference images not displayed]

FINDINGS: There is no evidence of fracture or dislocation. There is no
evidence of arthropathy or other focal bone abnormality. Soft
tissues are unremarkable.
IMPRESSION: Negative.

## 2018-05-04 DIAGNOSIS — Z760 Encounter for issue of repeat prescription: Secondary | ICD-10-CM | POA: Diagnosis not present

## 2018-05-20 ENCOUNTER — Ambulatory Visit (INDEPENDENT_AMBULATORY_CARE_PROVIDER_SITE_OTHER): Payer: 59 | Admitting: Family Medicine

## 2018-05-20 VITALS — BP 100/68 | HR 75 | Temp 97.7°F | Ht 63.0 in | Wt 113.2 lb

## 2018-05-20 DIAGNOSIS — R109 Unspecified abdominal pain: Secondary | ICD-10-CM | POA: Diagnosis not present

## 2018-05-20 LAB — URINALYSIS, ROUTINE W REFLEX MICROSCOPIC
BILIRUBIN URINE: NEGATIVE
GLUCOSE, UA: NEGATIVE
Hgb urine dipstick: NEGATIVE
Nitrite: NEGATIVE
RBC / HPF: NONE SEEN /HPF (ref 0–2)
SPECIFIC GRAVITY, URINE: 1.027 (ref 1.001–1.03)
pH: 6 (ref 5.0–8.0)

## 2018-05-20 LAB — MICROSCOPIC MESSAGE

## 2018-05-20 MED ORDER — CEPHALEXIN 500 MG PO CAPS: 500.0000 mg | ORAL_CAPSULE | Freq: Four times a day (QID) | ORAL | 0 refills | Status: AC

## 2018-05-20 NOTE — Patient Instructions (Signed)
Treat for possible urinary infection - kelfex safe in pregnancy/nursing, should be find with your childhood rash.  If it does not improve I would follow up here or at OB/GYN to get checked again - would need to do more invasive tests/exam, but now it does not seem indicated.  And may be some menstrual cycle symptoms that would go away soon.

## 2018-05-20 NOTE — Progress Notes (Signed)
Patient ID: Kelly Watts, female    DOB: Jan 30, 1996, 23 y.o.   MRN: [id number]  PCP: Salley Scarleturham, Kawanta F, MD  Chief Complaint  Patient presents with  . Abdominal Pain    Patient has c/o lower abdominal pain describes it as cramps. Onset last week.    Subjective:   Kelly Watts is a 23 y.o. female, presents to clinic with CC of cramping to B/L lower quadrants for 1.5 weeks.  Started at night initially, was very mild and intermittent, but now more more constants 3/10, with some mild hip pain "she points to low posterior pelvis - not to hips or groin), no radiation to suprapubic area. No urinary frequency, urgency, hematuria  No urinary sx, no vag sx, One sexual partner, husband,  She has nexplanon since May 2019, is still nursing 549 month old, no period or spotting, has not had sex with husband since sx started.  She denies any fever, chills, sweats, flank pain, nausea, vomiting, diarrhea.     Patient Active Problem List   Diagnosis Date Noted  . History of IUFD 08/11/2017  . History of preterm delivery 01/20/2017  . Kidney stones 10/13/2016  . Kidney cyst, acquired 10/13/2016  . Flank pain 09/13/2013  . Nexplanon insertion 11/02/2012     Prior to Admission medications   Medication Sig Start Date End Date Taking? Authorizing Provider  hydrocortisone (ANUSOL-HC) 25 MG suppository Place 1 suppository (25 mg total) rectally every 12 (twelve) hours. Patient not taking: Reported on 05/20/2018 11/09/17 11/09/18  Allayne Butcherixon, Mary B, PA-C     Allergies  Allergen Reactions  . Penicillins Hives    Has patient had a PCN reaction causing immediate rash, facial/tongue/throat swelling, SOB or lightheadedness with hypotension: Yes Has patient had a PCN reaction causing severe rash involving mucus membranes or skin necrosis: No Has patient had a PCN reaction that required hospitalization: No Has patient had a PCN reaction occurring within the last 10 years: No If all of the above answers  are "NO", then may proceed with Cephalosporin use.   . Sulfa Antibiotics Hives     Family History  Problem Relation Age of Onset  . Coronary artery disease Paternal Grandfather   . Cancer Paternal Grandmother        breast  . Diabetes Maternal Grandmother   . Thyroid disease Maternal Grandmother   . Heart disease Maternal Grandmother   . Melanoma Maternal Grandfather      Social History   Socioeconomic History  . Marital status: Single    Spouse name: Not on file  . Number of children: Not on file  . Years of education: Not on file  . Highest education level: Not on file  Occupational History  . Not on file  Social Needs  . Financial resource strain: Not on file  . Food insecurity:    Worry: Not on file    Inability: Not on file  . Transportation needs:    Medical: Not on file    Non-medical: Not on file  Tobacco Use  . Smoking status: Former Smoker    Packs/day: 0.25    Years: 3.00    Pack years: 0.75    Types: Cigarettes    Last attempt to quit: 01/30/2016    Years since quitting: 2.3  . Smokeless tobacco: Never Used  Substance and Sexual Activity  . Alcohol use: No    Comment: occ  . Drug use: No  . Sexual activity: Not Currently  Birth control/protection: None  Lifestyle  . Physical activity:    Days per week: Not on file    Minutes per session: Not on file  . Stress: Not on file  Relationships  . Social connections:    Talks on phone: Not on file    Gets together: Not on file    Attends religious service: Not on file    Active member of club or organization: Not on file    Attends meetings of clubs or organizations: Not on file    Relationship status: Not on file  . Intimate partner violence:    Fear of current or ex partner: Not on file    Emotionally abused: Not on file    Physically abused: Not on file    Forced sexual activity: Not on file  Other Topics Concern  . Not on file  Social History Narrative  . Not on file     Review of  Systems  Constitutional: Negative.  Negative for activity change, appetite change, chills, diaphoresis, fatigue and fever.  HENT: Negative.   Eyes: Negative.   Respiratory: Negative.   Cardiovascular: Negative.   Gastrointestinal: Positive for abdominal pain. Negative for abdominal distention, blood in stool, constipation, diarrhea, nausea and vomiting.  Endocrine: Negative.   Genitourinary: Negative.  Negative for decreased urine volume, difficulty urinating, dysuria, enuresis, flank pain, frequency, genital sores, hematuria, menstrual problem, pelvic pain, urgency, vaginal bleeding and vaginal discharge.  Musculoskeletal: Negative.   Skin: Negative.   Allergic/Immunologic: Negative.   Neurological: Negative.   Hematological: Negative.   Psychiatric/Behavioral: Negative.   All other systems reviewed and are negative.      Objective:    Vitals:   05/20/18 1205  BP: 100/68  Pulse: 75  Temp: 97.7 F (36.5 C)  TempSrc: Oral  SpO2: 98%  Weight: 113 lb 4 oz (51.4 kg)  Height: 5\' 3"  (1.6 m)      Physical Exam Vitals signs and nursing note reviewed.  Constitutional:      General: She is not in acute distress.    Appearance: Normal appearance. She is well-developed and normal weight. She is not ill-appearing, toxic-appearing or diaphoretic.  HENT:     Head: Normocephalic and atraumatic.     Right Ear: External ear normal.     Left Ear: External ear normal.     Nose: Nose normal.     Mouth/Throat:     Mouth: Mucous membranes are moist.     Pharynx: Oropharynx is clear.  Eyes:     General: No scleral icterus.       Right eye: No discharge.        Left eye: No discharge.     Conjunctiva/sclera: Conjunctivae normal.  Neck:     Musculoskeletal: Normal range of motion and neck supple.     Trachea: No tracheal deviation.  Cardiovascular:     Rate and Rhythm: Normal rate and regular rhythm.     Pulses: Normal pulses.     Heart sounds: Normal heart sounds. No murmur. No  friction rub. No gallop.   Pulmonary:     Effort: Pulmonary effort is normal. No respiratory distress.     Breath sounds: Normal breath sounds. No stridor. No wheezing, rhonchi or rales.  Chest:     Chest wall: No tenderness.  Abdominal:     General: Abdomen is flat. Bowel sounds are normal. There is no distension.     Palpations: Abdomen is soft. There is no hepatomegaly, mass or pulsatile  mass.     Tenderness: There is no abdominal tenderness. There is no right CVA tenderness, left CVA tenderness, guarding or rebound. Negative signs include Murphy's sign, Rovsing's sign, McBurney's sign and psoas sign.     Hernia: No hernia is present.  Musculoskeletal: Normal range of motion.  Lymphadenopathy:     Cervical: No cervical adenopathy.  Skin:    General: Skin is warm and dry.     Capillary Refill: Capillary refill takes less than 2 seconds.     Coloration: Skin is not pale.     Findings: No rash.  Neurological:     Mental Status: She is alert and oriented to person, place, and time.     Motor: No abnormal muscle tone.     Coordination: Coordination normal.  Psychiatric:        Mood and Affect: Mood normal.        Behavior: Behavior normal.           Assessment & Plan:      ICD-10-CM   1. Abdominal cramping R10.9 Urinalysis, Routine w reflex microscopic    Microscopic Message    cephALEXin (KEFLEX) 500 MG capsule    UTI vs menstrual cramping. Urine sample was likely contaminated  -  Pt has checked with her mother and penicillin allergy was a rash as an infant near the end of an abx course of illness she had a mild rash reaction and she is never had any airway involvement, anaphylactic shock, hospitalization etc. I will change her allergy profile.  Keflex should be safe for her would be safe with nursing and would cover possible UTI adequately, will do 3 days and have patient follow-up if not improving.  Would want her to reduce sample with clean-catch and possibly add urine  culture.  Patient does have new form of birth control and 50-month-old infant, no menses since before pregnancy, this may be early cramping secondary to hormone cycles, ovulating or she may have some spotting soon.  Encouraged her to use NSAIDs for pain or discomfort, heating pad, and with any prolonged symptoms patient is established with her OB/GYN and she was encouraged to follow-up with them.    I did also offer further testing here is she would like to follow up here - if she would have any worsening of her sx, or any associated sx, would possibly need to do pelvic exam and/or labs, but at this time the sx are very mild, no guarding or rebound, abd soft, she has no associated GI, GYN or urinary sx - do not feel the that any invasive exam or additional tests are indicated at this time.      Danelle Berry, PA-C 05/20/18 12:16 PM

## 2018-05-24 ENCOUNTER — Encounter: Payer: Self-pay | Admitting: Family Medicine

## 2018-07-05 ENCOUNTER — Telehealth: Payer: Self-pay | Admitting: Family Medicine

## 2018-07-05 NOTE — Telephone Encounter (Signed)
Patient called stating that she has swollen glands in her neck and muscle aches. Scheduled patient for tomorrow at 0915. Advised patient to take otc medications such as Tylenol for fever or discomfort.

## 2018-07-06 ENCOUNTER — Other Ambulatory Visit: Payer: Self-pay

## 2018-07-06 ENCOUNTER — Encounter: Payer: Self-pay | Admitting: Family Medicine

## 2018-07-06 ENCOUNTER — Ambulatory Visit (INDEPENDENT_AMBULATORY_CARE_PROVIDER_SITE_OTHER): Payer: 59 | Admitting: Family Medicine

## 2018-07-06 VITALS — BP 120/72 | HR 88 | Temp 98.9°F | Resp 14 | Ht 63.0 in | Wt 114.0 lb

## 2018-07-06 DIAGNOSIS — J029 Acute pharyngitis, unspecified: Secondary | ICD-10-CM

## 2018-07-06 DIAGNOSIS — R509 Fever, unspecified: Secondary | ICD-10-CM | POA: Diagnosis not present

## 2018-07-06 DIAGNOSIS — R590 Localized enlarged lymph nodes: Secondary | ICD-10-CM

## 2018-07-06 MED ORDER — CEFDINIR 300 MG PO CAPS: 300.0000 mg | ORAL_CAPSULE | Freq: Two times a day (BID) | ORAL | 0 refills | Status: DC

## 2018-07-06 NOTE — Patient Instructions (Signed)
Flu test negative Rapid strep negative Start antibiotics, use salt water gargle, ibuprofen for pain Call if not improving

## 2018-07-06 NOTE — Progress Notes (Signed)
   Subjective:    Patient ID: Kelly Watts, female    DOB: [birth year], 23 y.o.   MRN: [id number]  Patient presents for Illness (x1 days- sore throat, lumph nodes in throat swollen, fever that has resolved)  Woke up yesterday morning left side of neck swollen and tender, fever started yesterday evening Tmax 100.21F. Took ibuprofen No cough, no vomiting or diarrhea Has sore throat associated but no sinus drainage      Review Of Systems:  GEN- denies fatigue,+ fever, weight loss,weakness, recent illness HEENT- denies eye drainage, change in vision, nasal discharge, CVS- denies chest pain, palpitations RESP- denies SOB, cough, wheeze ABD- denies N/V, change in stools, abd pain GU- denies dysuria, hematuria, dribbling, incontinence MSK- denies joint pain, muscle aches, injury Neuro- denies headache, dizziness, syncope, seizure activity       Objective:    BP 120/72   Pulse 88   Temp 98.9 F (37.2 C) (Oral)   Resp 14   Ht 5\' 3"  (1.6 m)   Wt 114 lb (51.7 kg)   SpO2 97%   BMI 20.19 kg/m  GEN- NAD, alert and oriented x3 HEENT- PERRL, EOMI, non injected sclera, pink conjunctiva, MMM, oropharynx mild injection, TM clear bilat no effusion, exudate right tonsil  No maxillary sinus tenderness, inflammed turbinates,  Nasal drainage  Neck- Supple, + left cervical and post auricular LAD CVS- RRR, no murmur RESP-CTAB EXT- No edema Pulses- Radial 2+         Assessment & Plan:      Problem List Items Addressed This Visit    None    Visit Diagnoses    Pharyngitis, unspecified etiology    -  Primary   Strep and flu neg, likely early viral, will use NSAID, salt water gargle, given omnicef if not improving in 48 hours can start, throat culture sent   Relevant Orders   Influenza A and B Ag, Immunoassay (Completed)   STREP GROUP A AG, W/REFLEX TO CULT (Completed)      Note: This dictation was prepared with Dragon dictation along with smaller phrase technology. Any  transcriptional errors that result from this process are unintentional.

## 2018-07-07 NOTE — Telephone Encounter (Signed)
Note sent to nurse. 

## 2018-07-08 LAB — CULTURE, GROUP A STREP
MICRO NUMBER:: 269232
SOURCE:: 0
SPECIMEN QUALITY:: ADEQUATE

## 2018-07-08 LAB — INFLUENZA A AND B AG, IMMUNOASSAY
INFLUENZA A ANTIGEN: NOT DETECTED
INFLUENZA B ANTIGEN: NOT DETECTED

## 2018-07-08 LAB — STREP GROUP A AG, W/REFLEX TO CULT: Streptococcus, Group A Screen (Direct): NOT DETECTED

## 2018-09-06 ENCOUNTER — Telehealth: Payer: Self-pay | Admitting: Adult Health

## 2018-09-06 NOTE — Telephone Encounter (Signed)
Spoke with pt. Pt having abd pain. PCP advised to follow up with [place] if it continued. Pt has abd pain after sex. Advised will need an appt. Pt voiced understanding and call was transferred to front desk for appt. JSY

## 2018-09-06 NOTE — Telephone Encounter (Signed)
Patient called stating that she would like to schedule and appointment with [facility]. Pt states she will explain to the nurse. Please contact pt

## 2018-09-07 ENCOUNTER — Encounter: Payer: Self-pay | Admitting: *Deleted

## 2018-09-08 ENCOUNTER — Encounter: Payer: Self-pay | Admitting: Adult Health

## 2018-09-08 ENCOUNTER — Other Ambulatory Visit: Payer: Self-pay

## 2018-09-08 ENCOUNTER — Ambulatory Visit (INDEPENDENT_AMBULATORY_CARE_PROVIDER_SITE_OTHER): Payer: 59 | Admitting: Adult Health

## 2018-09-08 VITALS — BP 94/55 | HR 79 | Ht 63.0 in | Wt 119.0 lb

## 2018-09-08 DIAGNOSIS — R102 Pelvic and perineal pain unspecified side: Secondary | ICD-10-CM

## 2018-09-08 DIAGNOSIS — Z975 Presence of (intrauterine) contraceptive device: Secondary | ICD-10-CM

## 2018-09-08 NOTE — Patient Instructions (Signed)
Ovarian Cyst  An ovarian cyst is a fluid-filled sac on an ovary. The ovaries are organs that make eggs in women. Most ovarian cysts go away on their own and are not cancerous (are benign). Some cysts need treatment.  Follow these instructions at home:   Take over-the-counter and prescription medicines only as told by your doctor.   Do not drive or use heavy machinery while taking prescription pain medicine.   Get pelvic exams and Pap tests as often as told by your doctor.   Return to your normal activities as told by your doctor. Ask your doctor what activities are safe for you.   Do not use any products that contain nicotine or tobacco, such as cigarettes and e-cigarettes. If you need help quitting, ask your doctor.   Keep all follow-up visits as told by your doctor. This is important.  Contact a doctor if:   Your periods are:  ? Late.  ? Irregular.  ? Painful.     Your periods stop.   You have pelvic pain that does not go away.   You have pressure on your bladder.   You have trouble making your bladder empty when you pee (urinate).   You have pain during sex.   You have any of the following in your belly (abdomen):  ? A feeling of fullness.  ? Pressure.  ? Discomfort.  ? Pain that does not go away.  ? Swelling.   You feel sick most of the time.   You have trouble pooping (have constipation).   You are not as hungry as usual (you lose your appetite).   You get very bad acne.   You start to have more hair on your body and face.   You are gaining weight or losing weight without changing your exercise and eating habits.   You think you may be pregnant.  Get help right away if:   You have belly pain that is very bad or gets worse.   You cannot eat or drink without throwing up (vomiting).   You suddenly get a fever.   Your period is a lot heavier than usual.  This information is not intended to replace advice given to you by your health care provider. Make sure you discuss any questions you have  with your health care provider.  Document Released: 10/08/2007 Document Revised: 11/09/2015 Document Reviewed: 09/23/2015  Elsevier Interactive Patient Education  2019 [employer]

## 2018-09-08 NOTE — Progress Notes (Signed)
Patient ID: Kelly Watts, female   DOB: 03/15/1996, 23 y.o.   MRN: [id number] History of Present Illness: Kelly Watts is a 23 year old white female, married, G2P1101, in complaining of abdominal pain ,esp after sex.She just stopped breast feeding a month ago and this is when pain started.She has nexplanon in place. She denies any itching or odor. PCP is Dr Jeanice Lim.    Current Medications, Allergies, Past Medical History, Past Surgical History, Family History and Social History were reviewed in [facility] electronic medical record.     Review of Systems: +pelvic pain, x 1 month, esp after sex Denies pain with sex,itching or odor.    Physical Exam:BP (!) 94/55 (BP Location: Left Arm, Patient Position: Sitting, Cuff Size: Normal)   Pulse 79   Ht 5\' 3"  (1.6 m)   Wt 119 lb (54 kg)   LMP  (Exact Date)   Breastfeeding No Comment: recently stopped  BMI 21.08 kg/m  General:  Well developed, well nourished, no acute distress Skin:  Warm and dry Abdomen:  Soft, non tender, no hepatosplenomegaly Pelvic:  External genitalia is normal in appearance, no lesions.  The vagina is normal in appearance,scant white discharge without odor. Urethra has no lesions or masses. The cervix is bulbous.  Uterus is felt to be normal size, shape, and contour.  No adnexal masses,LLQ tenderness noted.Bladder is non tender, no masses felt. Psych:  No mood changes, alert and cooperative,seems happy Fall risk is low. PHQ 2 score is 0. Examination chaperoned by Marchelle Folks Rash LPN.   Impression: 1. Pelvic pain -I expect she just ovulated and has a ovarian cyst, will get [place] to assess - US PELVIS (TRANSABDOMINAL ONLY); Future - US PELVIS TRANSVANGINAL NON-OB (TV ONLY); Future  2. Nexplanon in place    Plan: Return in 1 week for GYN [place] to assess pelvic pain Review handout on ovary cyst  Get husband to pull out

## 2018-09-15 ENCOUNTER — Other Ambulatory Visit: Payer: Self-pay

## 2018-09-15 ENCOUNTER — Ambulatory Visit (INDEPENDENT_AMBULATORY_CARE_PROVIDER_SITE_OTHER): Payer: 59

## 2018-09-15 DIAGNOSIS — R102 Pelvic and perineal pain unspecified side: Secondary | ICD-10-CM

## 2018-09-15 NOTE — Progress Notes (Signed)
PELVIC US TA/TV: homogeneous anteverted uterus,wnl,EEC 4.4 mm,simple right ovarian cyst 3.6 x 2.9 x 3.1 cm,normal left ovary,simple dominate follicle left ovary 1.9 x 1.7 x 1.9 cm,ovaries appear mobile,no free fluid,some left adnexa discomfort

## 2018-10-05 IMAGING — CT CT ABD-PELV W/ CM
2 of 4 series · 14 of 46 positions shown, 16 images · IV contrast (Isovue)
Comparison: None.

CLINICAL DATA: 21-year-old with left lower quadrant abdominal pain
for 4 days. Elevated white blood cell count.

EXAM:
CT ABDOMEN AND PELVIS WITH CONTRAST
TECHNIQUE: Multidetector CT imaging of the abdomen and pelvis was performed
using the standard protocol following bolus administration of
intravenous contrast.
CONTRAST:  100mL [id number] IOPAMIDOL ([id number]) INJECTION 61%

[Series 2: axial st · axial · 0.65mm/px · z∈[+955,+1340]mm · 11 of 93 slices shown, 13 images]
[im 8/93  soft-tissue]
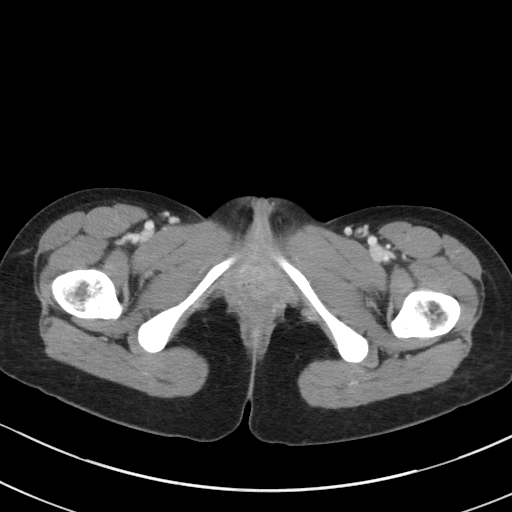
[im 8/93  bone]
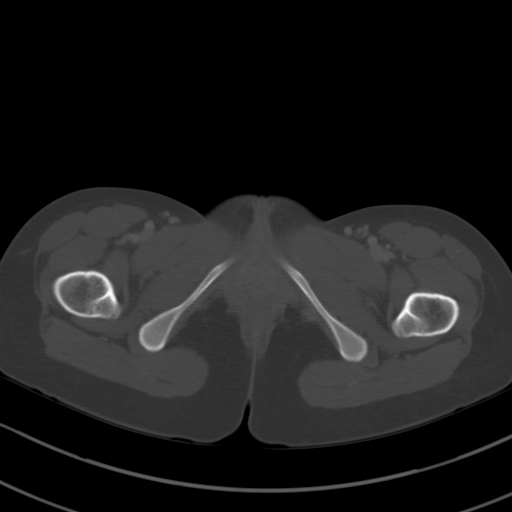
[im 15/93  soft-tissue]
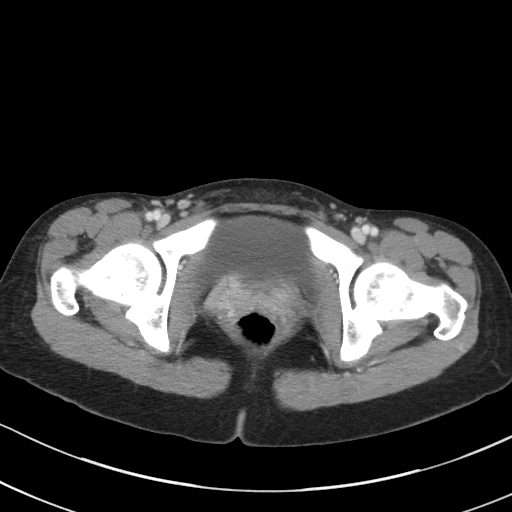
[im 22/93  soft-tissue]
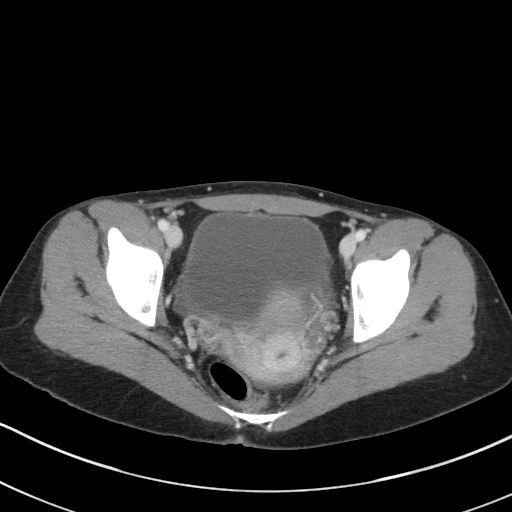
[im 29/93  soft-tissue]
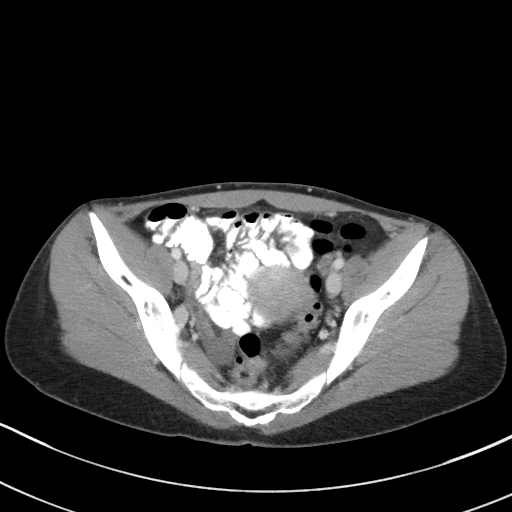
[im 39/93  soft-tissue]
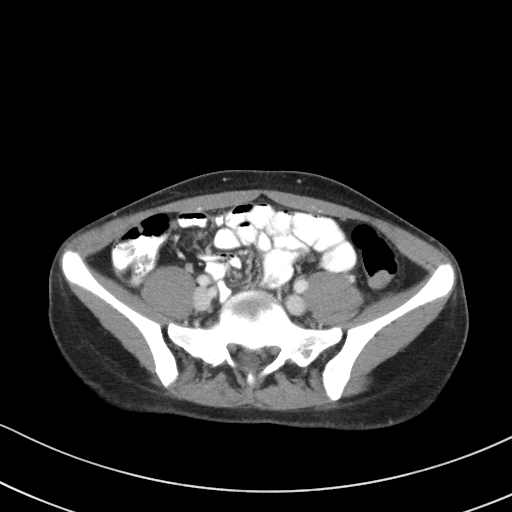
[im 47/93  soft-tissue]
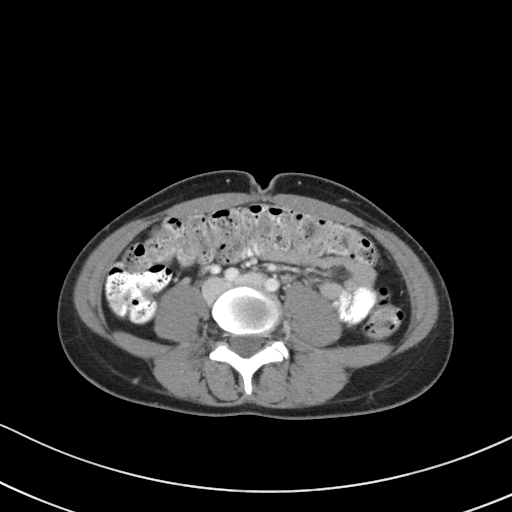
[im 54/93  soft-tissue]
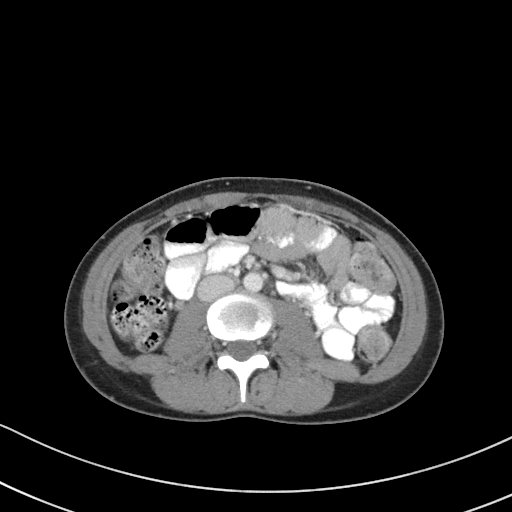
[im 64/93  soft-tissue]
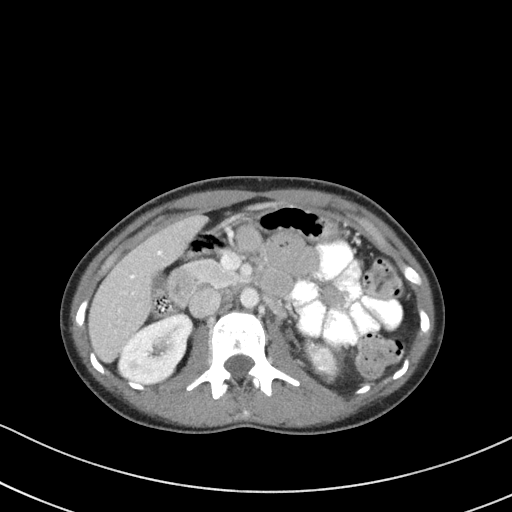
[im 71/93  soft-tissue]
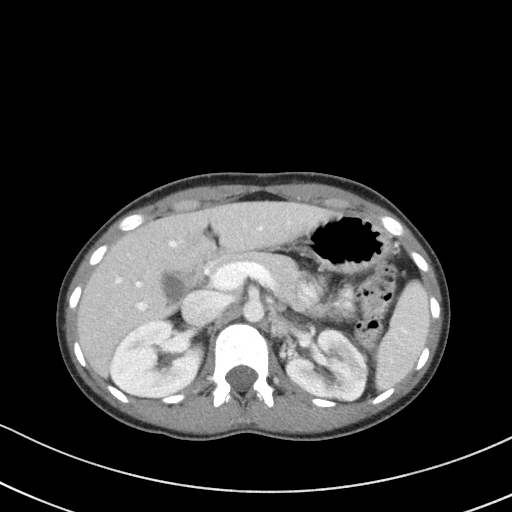
[im 71/93  bone]
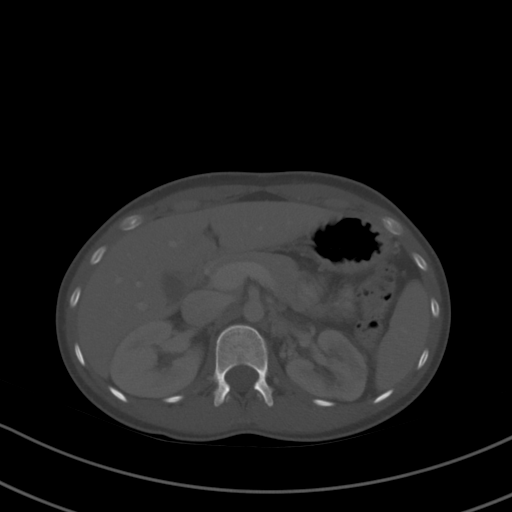
[im 78/93  soft-tissue]
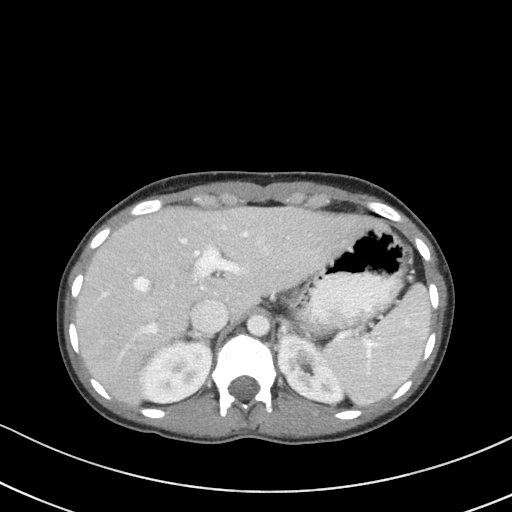
[im 85/93  soft-tissue]
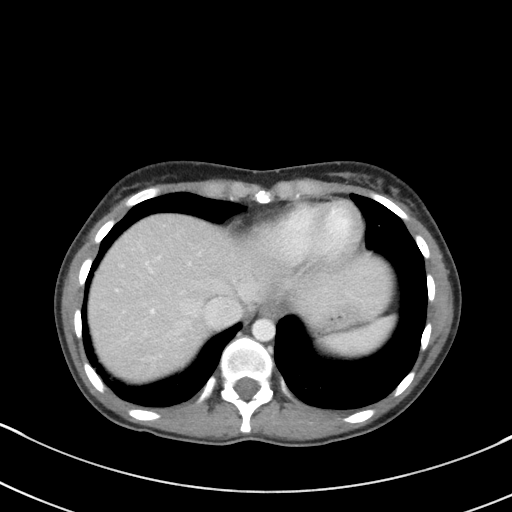

[Series 5: coronal st · coronal · 0.60mm/px · 3 of 53 slices shown]
[im 18/53  soft-tissue]
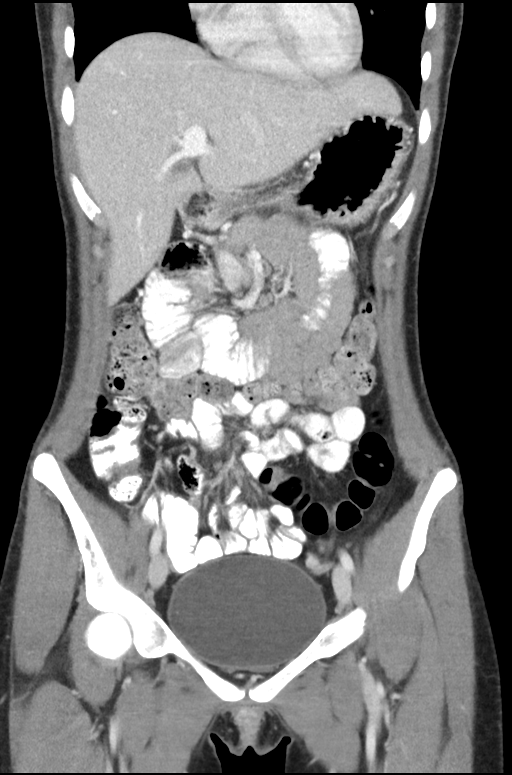
[im 24/53  soft-tissue]
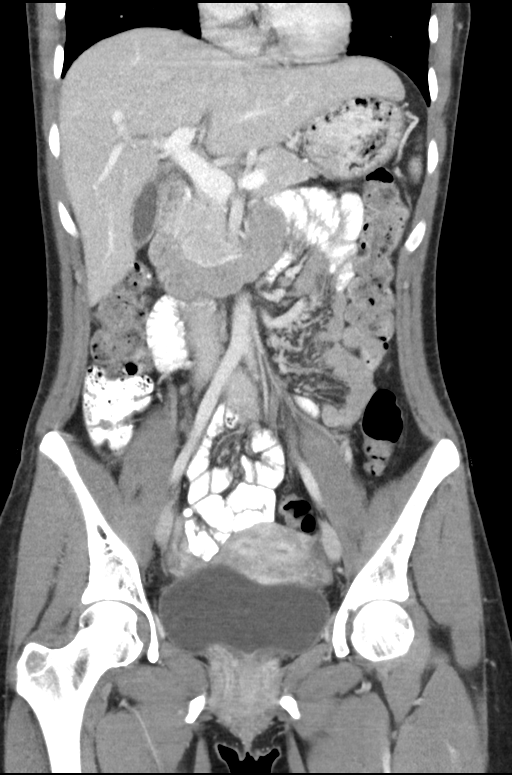
[im 29/53  soft-tissue]
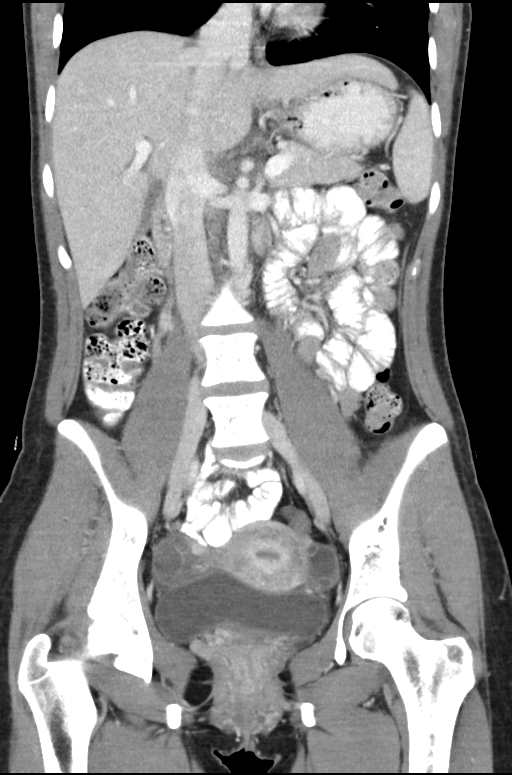

[14 of 46 positions shown; findings below may reference images not displayed]

FINDINGS: Lower chest: Lung bases are clear.

Hepatobiliary: Normal appearance of the liver, gallbladder and
portal venous system.

Pancreas: Normal appearance of the pancreas without inflammation or
duct dilatation.

Spleen: Normal appearance of spleen without enlargement.

Adrenals/Urinary Tract: Normal adrenal glands. Right kidney is
normal without hydronephrosis. Mild distention of the urinary
bladder. There is cortical scarring in the left kidney lower pole
with small stones in the left kidney lower pole. The largest left
kidney stone measures 7 mm. Heterogeneous enhancement the left
kidney upper pole and the left kidney interpolar region. This is
best seen on the coronal reformats, sequence 5 image 43. Findings
are concerning for left pyelonephritis. Negative for left
hydronephrosis.

Stomach/Bowel: Appendix contains some oral contrast in the proximal
aspect. The tip of the appendix extends into the pelvis and
surrounded by pelvic fluid. No evidence for acute appendix
inflammation. No evidence for acute bowel inflammation or
obstruction.

Vascular/Lymphatic: No significant vascular findings are present. No
enlarged abdominal or pelvic lymph nodes.

Reproductive: Small to moderate amount of pelvic free fluid.
Prominent veins in the pelvis. Low-density structures involving the
adnexa regions probably represent follicles but difficult to exclude
hydrosalpinx.

Other: Small amount amount of pelvic fluid around the uterus and
adnexal structures. Negative for free air.

Musculoskeletal: No acute bone abnormality.
IMPRESSION: Scarring in the left kidney with left renal calculi. In addition,
there is concern for heterogeneity in the left kidney upper pole and
left kidney interpolar region. This could be related to scarring but
cannot exclude left pyelonephritis. No evidence for an obstructing
stone.

Small to moderate amount of fluid in the pelvis with low-density
structures involving the adnexa. Findings are nonspecific. Difficult
to exclude hydrosalpinx. Recommend further characterization with a
pelvic ultrasound, particularly if there is concern for pelvic
inflammatory disease.

These results will be called to the ordering clinician or
representative by the Radiologist Assistant, and communication
documented in the PACS or zVision Dashboard.

## 2018-11-03 IMAGING — CR DG ABDOMEN 1V
1 series · 1 of 1 positions shown · non-contrast
Comparison: 10/14/2016 .

CLINICAL DATA: Left renal stone.

EXAM:
ABDOMEN - 1 VIEW

[t abdomen supine]
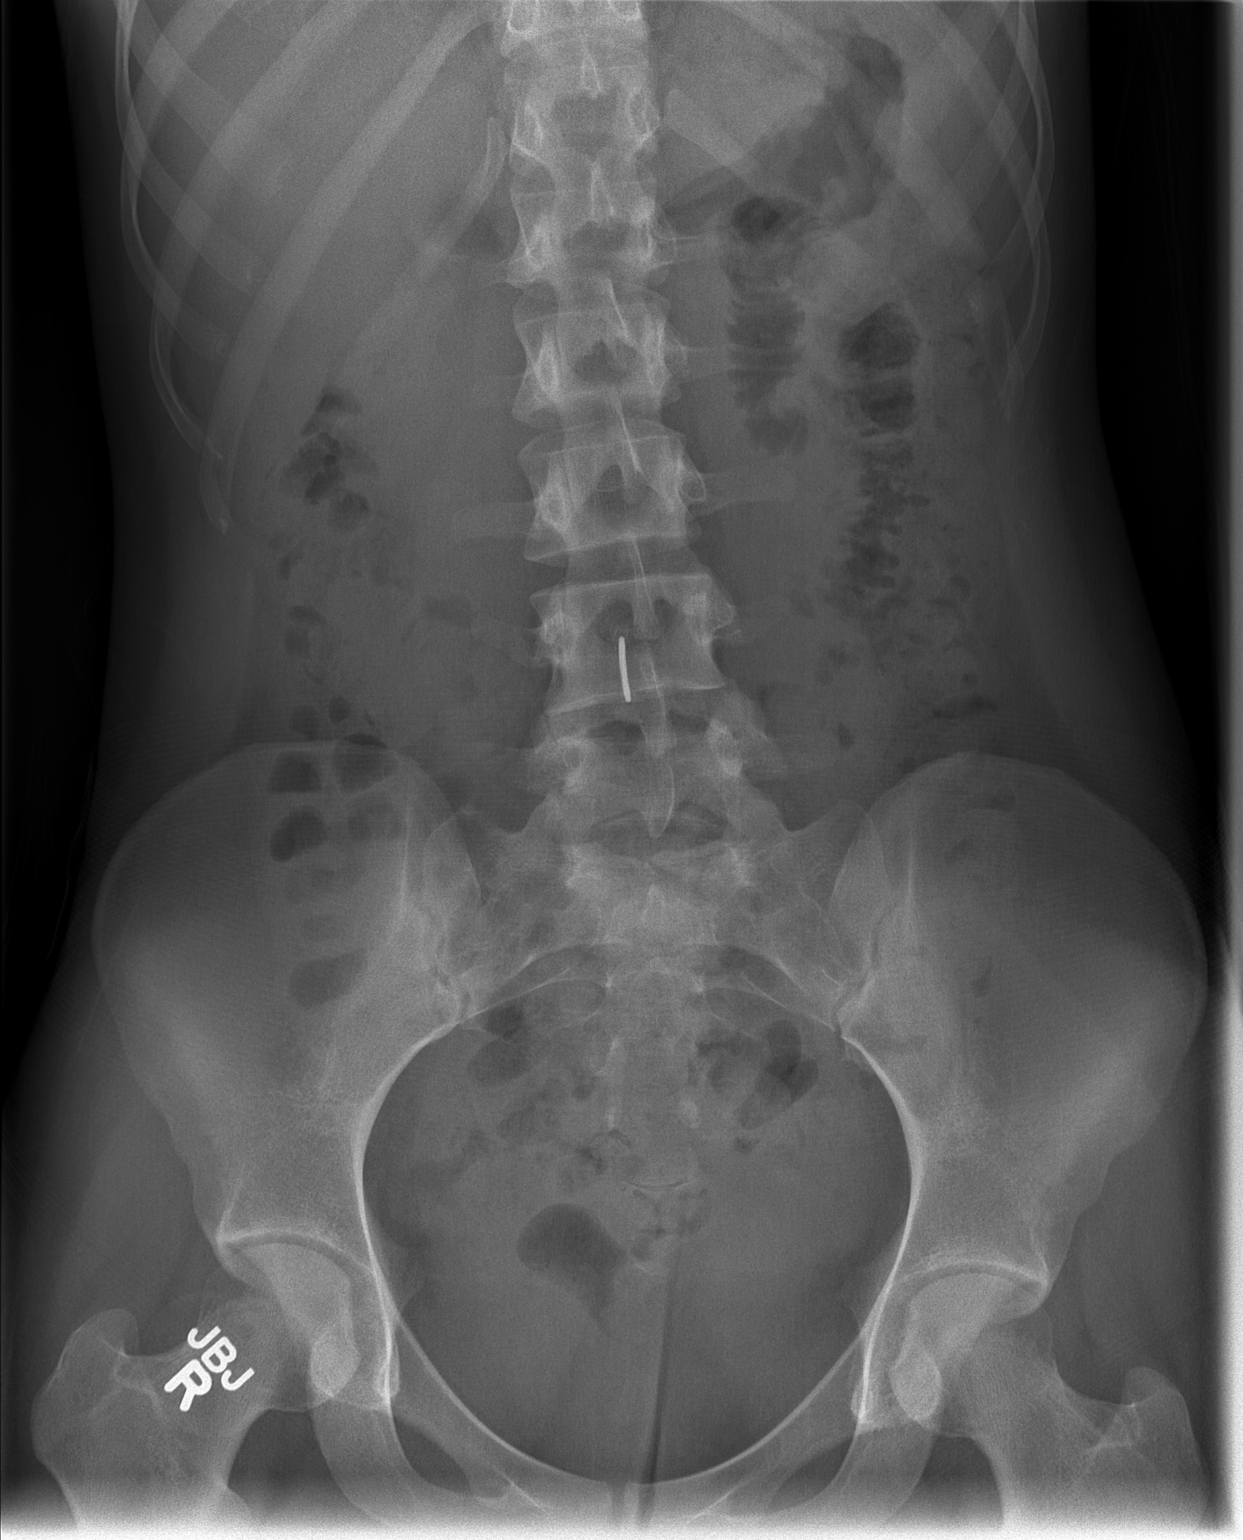

[1 of 1 positions shown; findings below may reference images not displayed]

FINDINGS: Soft tissue structures are unremarkable. No bowel distention.
Nonspecific air-filled loops small bowel noted. Stool in the colon.
Faint calcific densities again noted over the left kidney suggesting
left nephrolithiasis. No evidence of urolithiasis. No acute bony
abnormality identified. Mild lumbar scoliosis concave left.
IMPRESSION: Faint calcific densities again noted over the left kidney suggesting
left nephrolithiasis. No evidence urolithiasis. No acute abnormality
identified.

## 2019-01-18 ENCOUNTER — Ambulatory Visit (INDEPENDENT_AMBULATORY_CARE_PROVIDER_SITE_OTHER): Payer: 59 | Admitting: Women's Health

## 2019-01-18 ENCOUNTER — Other Ambulatory Visit: Payer: Self-pay

## 2019-01-18 ENCOUNTER — Encounter: Payer: Self-pay | Admitting: Women's Health

## 2019-01-18 VITALS — BP 89/59 | HR 82 | Ht 63.0 in | Wt 124.0 lb

## 2019-01-18 DIAGNOSIS — Z3046 Encounter for surveillance of implantable subdermal contraceptive: Secondary | ICD-10-CM

## 2019-01-18 NOTE — Progress Notes (Signed)
   [facility] REMOVAL Patient name: Kelly Watts MRN [id number]  Date of birth: [birth year] Subjective Findings:   Kelly Watts is a 23 y.o. G35P1101 Caucasian female being seen today for removal of a Nexplanon. Her Nexplanon was placed 09/25/17.  She desires removal because of frequent bleeding and possibly wanting to try for another pregnancy soon. Signed copy of informed consent in chart.   Patient's last menstrual period was 01/11/2019. Last pap9/18/18. Results were:  normal The planned method of family planning is condoms Pertinent History Reviewed:   Reviewed past medical,surgical, social, obstetrical and family history.  Reviewed problem list, medications and allergies. Objective Findings & Procedure:    Vitals:   01/18/19 1147  BP: (!) 89/59  Pulse: 82  Weight: 124 lb (56.2 kg)  Height: 5\' 3"  (1.6 m)  Body mass index is 21.97 kg/m.  No results found for this or any previous visit (from the past 24 hour(s)).   Time out was performed.  Nexplanon site identified.  Area prepped in usual sterile fashon. One cc of 2% lidocaine was used to anesthetize the area at the distal end of the implant. A small stab incision was made right beside the implant on the distal portion.  The Nexplanon rod was grasped using hemostats and removed without difficulty.  There was less than 3 cc blood loss. There were no complications.  Steri-strips were applied over the small incision and a pressure bandage was applied.  The patient tolerated the procedure well. Assessment & Plan:   1) Nexplanon removal She was instructed to keep the area clean and dry, remove pressure bandage in 24 hours, and keep insertion site covered with the steri-strip for 3-5 days.   Follow-up PRN problems. Start pnv  No orders of the defined types were placed in this encounter.   Follow-up: Return in about 1 year (around 01/18/2020) for Pap & physical.  [place], [facility] 01/18/2019 12:34 PM

## 2019-01-18 NOTE — Patient Instructions (Signed)
Keep the area clean and dry.  You can remove the big bandage in 24 hours, and the small steri-strip bandage in 3-5 days.  Start prenatal vitamins 

## 2019-03-07 ENCOUNTER — Other Ambulatory Visit: Payer: Self-pay

## 2019-03-07 ENCOUNTER — Ambulatory Visit: Payer: 59 | Admitting: Family Medicine

## 2019-03-07 ENCOUNTER — Ambulatory Visit (INDEPENDENT_AMBULATORY_CARE_PROVIDER_SITE_OTHER): Payer: 59 | Admitting: Family Medicine

## 2019-03-07 ENCOUNTER — Encounter: Payer: Self-pay | Admitting: Family Medicine

## 2019-03-07 VITALS — BP 98/64 | HR 64 | Temp 98.5°F | Resp 18 | Ht 63.0 in | Wt 129.0 lb

## 2019-03-07 DIAGNOSIS — Z23 Encounter for immunization: Secondary | ICD-10-CM | POA: Diagnosis not present

## 2019-03-07 DIAGNOSIS — L509 Urticaria, unspecified: Secondary | ICD-10-CM | POA: Diagnosis not present

## 2019-03-07 MED ORDER — PREDNISONE 10 MG PO TABS: ORAL_TABLET | ORAL | 0 refills | Status: DC

## 2019-03-07 NOTE — Patient Instructions (Signed)
Referral to allergy  F/U as needed

## 2019-03-07 NOTE — Progress Notes (Signed)
   Subjective:    Patient ID: Kelly Watts, female    DOB: 03-09-1996, 23 y.o.   MRN: [id number]  Patient presents for Dermatitis (under L arm, inside crease of both legs)  Here with rash that started on Friday.  She broke out in the inner crease of both of her legs she showed me a picture of this it was a raised urticarial appearance.  A couple days later she noticed it on her left underarm.  No new change in soap detergent food.  She has had intermittent episodes of urticaria at least 2-3 times a year for a while now nothing particular can be found.  She has not had formal allergy testing. For this rash and in the past she has used Claritin, topical calalamine, eposon salt, cortisone 10, benadryl, and bendayl cream  Breathing no recent illness.   Review Of Systems:  GEN- denies fatigue, fever, weight loss,weakness, recent illness HEENT- denies eye drainage, change in vision, nasal discharge, CVS- denies chest pain, palpitations RESP- denies SOB, cough, wheeze ABD- denies N/V, change in stools, abd pain GU- denies dysuria, hematuria, dribbling, incontinence MSK- denies joint pain, muscle aches, injury Neuro- denies headache, dizziness, syncope, seizure activity       Objective:    BP 98/64 (BP Location: Right Arm, Patient Position: Sitting, Cuff Size: Normal)   Pulse 64   Temp 98.5 F (36.9 C) (Oral)   Resp 18   Ht 5\' 3"  (1.6 m)   Wt 129 lb (58.5 kg)   SpO2 100%   BMI 22.85 kg/m  GEN- NAD, alert and oriented x3 HEENT- PERRL, EOMI, non injected sclera, pink conjunctiva, MMM, oropharynx clear Neck- Supple, no lymphadenopathy CVS- RRR, no murmur RESP-CTAB Skin -erythematous fine maculopapular lesions near the groin region bilaterally as well as inner thighs.  Urticaria-like lesions slightly raised in the left axilla EXT- No edema Pulses- Radial2+        Assessment & Plan:  Note patient was given a flu shot today.  She is not immunocompromise but I did tell her that  this may decrease the full effects of her influenza shot being on prednisone   Problem List Items Addressed This Visit    None    Visit Diagnoses    Urticaria    -  Primary   We will treat with prednisone taper.  She also has antihistamines.  Referral to allergy for recurrent episodes unknown cause.   Relevant Orders   Ambulatory referral to Allergy   Need for immunization against influenza       Relevant Orders   Flu Vaccine QUAD 36+ mos IM (Completed)      Note: This dictation was prepared with Dragon dictation along with smaller phrase technology. Any transcriptional errors that result from this process are unintentional.

## 2019-04-08 ENCOUNTER — Encounter: Payer: Self-pay | Admitting: Allergy & Immunology

## 2019-04-08 ENCOUNTER — Other Ambulatory Visit: Payer: Self-pay

## 2019-04-08 ENCOUNTER — Ambulatory Visit (INDEPENDENT_AMBULATORY_CARE_PROVIDER_SITE_OTHER): Payer: 59 | Admitting: Allergy & Immunology

## 2019-04-08 VITALS — BP 108/68 | HR 102 | Temp 98.3°F | Resp 16 | Ht 62.8 in | Wt 132.8 lb

## 2019-04-08 DIAGNOSIS — L500 Allergic urticaria: Secondary | ICD-10-CM | POA: Diagnosis not present

## 2019-04-08 DIAGNOSIS — L508 Other urticaria: Secondary | ICD-10-CM

## 2019-04-08 NOTE — Patient Instructions (Addendum)
1. Chronic urticaria - Your history does not have any "red flags" such as fevers, joint pains, or permanent skin changes that would be concerning for a more serious cause of hives.  - We will get some labs to rule out serious causes of hives: alpha gal panel, complete blood count, tryptase level, chronic urticaria panel, CMP, ESR, and CRP. - Chronic hives are often times a self limited process and will "burn themselves out" over 6-12 months, although this is not always the case (although clearly in your case, this is not going to happen!). - Testing was positive to cockroach and dust mite (avoidance measures provided). - Dust mites are a known cause of urticaria (hives), so I would definitely get some dust mite encasements. - In the meantime, start suppressive dosing of antihistamines:   - Morning: Zyrtec (cetirizine) 10-'20mg'$  (one or two tablets) - ADD IF NEEDED  - Evening: Zyrtec (cetirizine) 10-'20mg'$  (one or two tablets) - EVERY NIGHT TO SUPPRESS HIVES - You can change this dosing at home, decreasing the dose as needed or increasing the dosing as needed.  - If you are not tolerating the medications or are tired of taking them every day, we can start treatment with a monthly injectable medication called Xolair.   2. Return in about 4 weeks (around 05/06/2019). This can be an in-person, a virtual Webex or a telephone follow up visit.   Please inform us of any Emergency Department visits, hospitalizations, or changes in symptoms. Call us before going to the ED for breathing or allergy symptoms since we might be able to fit you in for a sick visit. Feel free to contact us anytime with any questions, problems, or concerns.  It was a pleasure to meet you today!  Websites that have reliable patient information: 1. American Academy of Asthma, Allergy, and Immunology: www.aaaai.org 2. Food Allergy Research and Education (FARE): foodallergy.org 3. Mothers of Asthmatics:  http://www.asthmacommunitynetwork.org 4. American College of Allergy, Asthma, and Immunology: www.acaai.org  "Like" [place] on Facebook and Instagram for our latest updates!      Make sure you are registered to vote! If you have moved or changed any of your contact information, you will need to get this updated before voting!  In some cases, you MAY be able to register to vote online: [email or web address]    Control of Dust Mite Allergen    Dust mites play a major role in allergic asthma and rhinitis.  They occur in environments with high humidity wherever human skin is found.  Dust mites absorb humidity from the atmosphere (ie, they do not drink) and feed on organic matter (including shed human and animal skin).  Dust mites are a microscopic type of insect that you cannot see with the naked eye.  High levels of dust mites have been detected from mattresses, pillows, carpets, upholstered furniture, bed covers, clothes, soft toys and any woven material.  The principal allergen of the dust mite is found in its feces.  A gram of dust may contain 1,000 mites and 250,000 fecal particles.  Mite antigen is easily measured in the air during house cleaning activities.  Dust mites do not bite and do not cause harm to humans, other than by triggering allergies/asthma.    Ways to decrease your exposure to dust mites in your home:  1. Encase mattresses, box springs and pillows with a mite-impermeable barrier or cover   2. Wash sheets, blankets and drapes weekly in hot water (130 F) with detergent and dry them  in a dryer on the hot setting.  3. Have the room cleaned frequently with a vacuum cleaner and a damp dust-mop.  For carpeting or rugs, vacuuming with a vacuum cleaner equipped with a high-efficiency particulate air (HEPA) filter.  The dust mite allergic individual should not be in a room which is being cleaned and should wait 1 hour after cleaning before going into the room. 4.  Do not sleep on upholstered furniture (eg, couches).   5. If possible removing carpeting, upholstered furniture and drapery from the home is ideal.  Horizontal blinds should be eliminated in the rooms where the person spends the most time (bedroom, study, television room).  Washable vinyl, roller-type shades are optimal. 6. Remove all non-washable stuffed toys from the bedroom.  Wash stuffed toys weekly like sheets and blankets above.   7. Reduce indoor humidity to less than 50%.  Inexpensive humidity monitors can be purchased at most hardware stores.  Do not use a humidifier as can make the problem worse and are not recommended.  Control of Cockroach Allergen  Cockroach allergen has been identified as an important cause of acute attacks of asthma, especially in urban settings.  There are fifty-five species of cockroach that exist in the [place], however only three, the [place], [place] species produce allergen that can affect patients with Asthma.  Allergens can be obtained from fecal particles, egg casings and secretions from cockroaches.    1. Remove food sources. 2. Reduce access to water. 3. Seal access and entry points. 4. Spray runways with 0.5-1% Diazinon or Chlorpyrifos 5. Blow boric acid power under stoves and refrigerator. 6. Place bait stations (hydramethylnon) at feeding sites.

## 2019-04-08 NOTE — Progress Notes (Signed)
NEW PATIENT  Date of Service/Encounter:  04/08/19  Referring provider: Alycia Rossetti, MD   Assessment:   Chronic urticaria - with sensitization to cockroach and dust mite  Plan/Recommendations:   1. Chronic urticaria - Your history does not have any "red flags" such as fevers, joint pains, or permanent skin changes that would be concerning for a more serious cause of hives.  - We will get some labs to rule out serious causes of hives: alpha gal panel, complete blood count, tryptase level, chronic urticaria panel, CMP, ESR, and CRP. - Chronic hives are often times a self limited process and will "burn themselves out" over 6-12 months, although this is not always the case (although clearly in your case, this is not going to happen!). - Testing was positive to cockroach and dust mite (avoidance measures provided). - Dust mites are a known cause of urticaria (hives), so I would definitely get some dust mite encasements. - In the meantime, start suppressive dosing of antihistamines:   - Morning: Zyrtec (cetirizine) 10-'20mg'$  (one or two tablets) - ADD IF NEEDED  - Evening: Zyrtec (cetirizine) 10-'20mg'$  (one or two tablets) - EVERY NIGHT TO SUPPRESS HIVES - You can change this dosing at home, decreasing the dose as needed or increasing the dosing as needed.  - If you are not tolerating the medications or are tired of taking them every day, we can start treatment with a monthly injectable medication called Xolair.   2. Return in about 4 weeks (around 05/06/2019). This can be an in-person, a virtual Webex or a telephone follow up visit.   Subjective:   Kelly Watts is a 23 y.o. female presenting today for evaluation of  Chief Complaint  Patient presents with  . Urticaria    On and off since childhood. Benadryl and showers make them better. Last for about 1 hour.     Kelly Watts has a history of the following: Patient Active Problem List   Diagnosis Date Noted  . History of  IUFD 08/11/2017  . History of preterm delivery 01/20/2017  . Kidney stones 10/13/2016  . Kidney cyst, acquired 10/13/2016  . Flank pain 09/13/2013    History obtained from: chart review and patient.  Kelly Watts was referred by [facility], Modena Nunnery, MD.     Kelly Watts is a 23 y.o. female presenting for an evaluation of chronic urticaria. Ms. Kelly Watts who presents to the office with rashes. Patient states that she has had these rashes for as "long as I can remember." She states that the rash is "itchy and red," and starts in the palms and spreads towards her abdomen. This occurs typically at nighttime between the hours of 1:00-3:00 am. Her rash typically lasts 2 hours and resolves with Benadryl. Patient denies any SHOB, voice changes, or airway edema during these episodes.   She has intermittently been on Implanon as birth control, but these urticarial episodes preceded when she started using this method of birth control. There have been no medication changes at all that have been correlated around the time of the outbreaks. The outbreaks predominantly occur at night. She denies any tick bites and has never had these outbreaks associated with red meats whatsoever. She does eat red meat on a somewhat regular basis. She has never had any gastrointestinal complaints with this.   She states [place]. She took care of cows, horses, goats, and chickens. The current pet in their house  is a turtle. She lives at home with her husband and daughter. Her home is 18 years old and does have some water damage they are trying to repair. She denies tobacco, alcohol, or illicit drug use.   Otherwise, there is no history of other atopic diseases, including asthma, food allergies, drug allergies, environmental allergies, stinging insect allergies, eczema or contact dermatitis. There is no significant infectious history. Vaccinations are up to date.    Past Medical History:  Patient Active Problem List   Diagnosis Date Noted  . History of IUFD 08/11/2017  . History of preterm delivery 01/20/2017  . Kidney stones 10/13/2016  . Kidney cyst, acquired 10/13/2016  . Flank pain 09/13/2013    Medication List:  Allergies as of 04/08/2019      Reactions   Penicillins Hives   Has patient had a PCN reaction causing immediate rash, facial/tongue/throat swelling, SOB or lightheadedness with hypotension: Yes Has patient had a PCN reaction causing severe rash involving mucus membranes or skin necrosis: No Has patient had a PCN reaction that required hospitalization: No Has patient had a PCN reaction occurring within the last 10 years: No If all of the above answers are "NO", then may proceed with Cephalosporin use.   Sulfa Antibiotics Hives      Medication List       Accurate as of April 08, 2019  7:07 PM. If you have any questions, ask your nurse or doctor.        STOP taking these medications   predniSONE 10 MG tablet Commonly known as: DELTASONE Stopped by: Valentina Shaggy, MD       Birth History: non-contributory  Developmental History: non-contributory  Past Surgical History: Past Surgical History:  Procedure Laterality Date  . EAR TUBE REMOVAL    . EXTRACORPOREAL SHOCK WAVE LITHOTRIPSY Left 10/30/2016   Procedure: LEFT EXTRACORPOREAL SHOCK WAVE LITHOTRIPSY (ESWL);  Surgeon: Ardis Hughs, MD;  Location: [facility];  Service: Urology;  Laterality: Left;  . TYMPANOSTOMY TUBE PLACEMENT    . WISDOM TOOTH EXTRACTION       Family History: Family History  Problem Relation Age of Onset  . Allergic rhinitis Father   . Asthma Mother   . Allergic rhinitis Mother   . Coronary artery disease Paternal Grandfather   . Cancer Paternal Grandmother        breast  . Diabetes Maternal Grandmother   . Thyroid disease Maternal Grandmother   . Heart disease Maternal Grandmother   . Melanoma Maternal Grandfather   . Allergic rhinitis Brother   .  Allergic rhinitis Brother   . Angioedema Neg Hx   . Atopy Neg Hx   . Eczema Neg Hx   . Immunodeficiency Neg Hx   . Urticaria Neg Hx      Social History: Kelly Watts lives at home with her husband and 73-year-old daughter.  They live in a house that is 39 years old.  There is hardwood throughout the home.  They have electric heating and central cooling.  There is a turtle inside of the home and dogs and chickens outside of the home.  She does not have dust mite covers on her bedding.  There is no tobacco exposure.  She currently works as a Printmaker.   Review of Systems  Constitutional: Negative.  Negative for chills, fever, malaise/fatigue and weight loss.  HENT: Negative.  Negative for congestion, ear discharge, ear pain and sore throat.   Eyes: Negative for pain, discharge and redness.  Respiratory:  Negative for cough, sputum production, shortness of breath and wheezing.   Cardiovascular: Negative.  Negative for chest pain and palpitations.  Gastrointestinal: Negative for abdominal pain, constipation, diarrhea, heartburn, nausea and vomiting.  Skin: Positive for itching and rash.  Neurological: Negative for dizziness and headaches.  Endo/Heme/Allergies: Negative for environmental allergies. Does not bruise/bleed easily.       Objective:   Blood pressure 108/68, pulse (!) 102, temperature 98.3 F (36.8 C), temperature source Temporal, resp. rate 16, height 5' 2.8" (1.595 m), weight 132 lb 12.8 oz (60.2 kg), SpO2 96 %, not currently breastfeeding. Body mass index is 23.67 kg/m.   Physical Exam:   Physical Exam  Constitutional: She appears well-developed.  HENT:  Head: Normocephalic and atraumatic.  Right Ear: Tympanic membrane, external ear and ear canal normal. No drainage, swelling or tenderness. Tympanic membrane is not injected, not scarred, not erythematous, not retracted and not bulging.  Left Ear: Tympanic membrane, external ear and ear canal normal. No drainage,  swelling or tenderness. Tympanic membrane is not injected, not scarred, not erythematous, not retracted and not bulging.  Nose: No mucosal edema, rhinorrhea, nasal deformity or septal deviation. No epistaxis. Right sinus exhibits no maxillary sinus tenderness and no frontal sinus tenderness. Left sinus exhibits no maxillary sinus tenderness and no frontal sinus tenderness.  Mouth/Throat: Uvula is midline and oropharynx is clear and moist. Mucous membranes are not pale and not dry.  Eyes: Pupils are equal, round, and reactive to light. Conjunctivae and EOM are normal. Right eye exhibits no chemosis and no discharge. Left eye exhibits no chemosis and no discharge. Right conjunctiva is not injected. Left conjunctiva is not injected.  Cardiovascular: Normal rate, regular rhythm and normal heart sounds.  Respiratory: Effort normal and breath sounds normal. No accessory muscle usage. No tachypnea. No respiratory distress. She has no wheezes. She has no rhonchi. She has no rales. She exhibits no tenderness.  GI: There is no abdominal tenderness. There is no rebound and no guarding.  Lymphadenopathy:       Head (right side): No submandibular, no tonsillar and no occipital adenopathy present.       Head (left side): No submandibular, no tonsillar and no occipital adenopathy present.    She has no cervical adenopathy.  Neurological: She is alert.  Skin: No abrasion, no petechiae and no rash noted. Rash is not papular, not vesicular and not urticarial. No erythema. No pallor.  No urticarial lesions noted today. No problems with eczematous or urticarial lesions.   Psychiatric: She has a normal mood and affect.     Diagnostic studies:      Allergy Studies:    Airborne Adult Perc - 04/08/19 1400    Time Antigen Placed  0220    Allergen Manufacturer  Lavella Hammock    Location  Back    Number of Test  59    1. Control-Buffer 50% Glycerol  Negative    2. Control-Histamine 1 mg/ml  3+    3. Albumin saline   Negative    4. Center [place]  Negative    5. [place]  Negative    6. [place]  Negative    7. [place] Blue  Negative    8. Meadow Fescue  Negative    9. Perennial Rye  Negative    10. Sweet Vernal  Negative    11. Timothy  Negative    12. Cocklebur  Negative    13. Burweed [place]er  Negative    14. Ragweed, short  Negative  15. Ragweed, Giant  Negative    16. Plantain,  English  Negative    17. Lamb's Quarters  Negative    18. Sheep Sorrell  Negative    19. Rough Pigweed  Negative    20. Marsh Elder, Rough  Negative    21. Mugwort, Common  Negative    22. Ash mix  Negative    23. Birch mix  Negative    24. Beech American  Negative    25. Box, Elder  Negative    26. Cedar, red  Negative    27. Cottonwood, [place]  Negative    28. Elm mix  Negative    29. Hickory mix  Negative    30. Maple mix  Negative    31. Oak, [place] mix  Negative    32. Pecan Pollen  Negative    33. Pine mix  Negative    34. Sycamore Eastern  Negative    35. [place], Black Pollen  Negative    36. Alternaria alternata  Negative    37. Cladosporium Herbarum  Negative    38. Aspergillus mix  Negative    39. Penicillium mix  Negative    40. Bipolaris sorokiniana (Helminthosporium)  Negative    41. Drechslera spicifera (Curvularia)  Negative    42. Mucor plumbeus  Negative    43. Fusarium moniliforme  Negative    44. Aureobasidium pullulans (pullulara)  Negative    45. Rhizopus oryzae  Negative    46. Botrytis cinera  Negative    47. Epicoccum nigrum  Negative    48. Phoma betae  Negative    49. Candida Albicans  Negative    50. Trichophyton mentagrophytes  Negative    51. Mite, D Farinae  5,000 AU/ml  2+    52. Mite, D Pteronyssinus  5,000 AU/ml  2+    53. Cat Hair 10,000 BAU/ml  Negative    54.  Dog Epithelia  Negative    56. Horse Epithelia  Negative    57. Cockroach, German  2+    58. Mouse  Negative    59. Tobacco Leaf  Negative       Allergy testing results were read and interpreted by  myself, documented by clinical staff.         Salvatore Marvel, MD All[facility] and [place] [facility] [place]

## 2019-04-09 ENCOUNTER — Encounter: Payer: Self-pay | Admitting: Allergy & Immunology

## 2019-04-15 LAB — CMP14+EGFR
ALT: 16 IU/L (ref 0–32)
AST: 13 IU/L (ref 0–40)
Albumin/Globulin Ratio: 1.6 (ref 1.2–2.2)
Albumin: 4.4 g/dL (ref 3.9–5.0)
Alkaline Phosphatase: 93 IU/L (ref 39–117)
BUN/Creatinine Ratio: 16 (ref 9–23)
BUN: 14 mg/dL (ref 6–20)
Bilirubin Total: 0.4 mg/dL (ref 0.0–1.2)
CO2: 25 mmol/L (ref 20–29)
Calcium: 9.5 mg/dL (ref 8.7–10.2)
Chloride: 102 mmol/L (ref 96–106)
Creatinine, Ser: 0.86 mg/dL (ref 0.57–1.00)
GFR calc Af Amer: 110 mL/min/{1.73_m2} (ref >59)
GFR calc non Af Amer: 96 mL/min/{1.73_m2} (ref >59)
Globulin, Total: 2.8 g/dL (ref 1.5–4.5)
Glucose: 80 mg/dL (ref 65–99)
Potassium: 4.3 mmol/L (ref 3.5–5.2)
Sodium: 143 mmol/L (ref 134–144)
Total Protein: 7.2 g/dL (ref 6.0–8.5)

## 2019-04-15 LAB — C-REACTIVE PROTEIN: CRP: <1 mg/L (ref 0–10)

## 2019-04-15 LAB — ALPHA-GAL PANEL
Alpha Gal IgE*: 1.15 kU/L — ABNORMAL HIGH (ref <0.10)
Beef (Bos spp) IgE: 0.96 kU/L — ABNORMAL HIGH (ref <0.35)
Class Interpretation: 1
Class Interpretation: 2
Class Interpretation: 2
Lamb/Mutton (Ovis spp) IgE: 0.51 kU/L — ABNORMAL HIGH (ref <0.35)
Pork (Sus spp) IgE: 0.78 kU/L — ABNORMAL HIGH (ref <0.35)

## 2019-04-15 LAB — CHRONIC URTICARIA: cu index: 2.9 (ref <10)

## 2019-04-15 LAB — ANA W/REFLEX IF POSITIVE: Anti Nuclear Antibody (ANA): NEGATIVE

## 2019-04-15 LAB — THYROID ANTIBODIES
Thyroglobulin Antibody: <1 IU/mL (ref 0.0–0.9)
Thyroperoxidase Ab SerPl-aCnc: 10 IU/mL (ref 0–34)

## 2019-04-15 LAB — TRYPTASE: Tryptase: 5.5 ug/L (ref 2.2–13.2)

## 2019-04-15 LAB — SEDIMENTATION RATE: Sed Rate: 12 mm/hr (ref 0–32)

## 2019-04-18 ENCOUNTER — Other Ambulatory Visit: Payer: Self-pay

## 2019-04-18 MED ORDER — EPINEPHRINE 0.3 MG/0.3ML IJ SOAJ: 0.3000 mg | Freq: Once | INTRAMUSCULAR | 1 refills | Status: AC

## 2019-05-02 ENCOUNTER — Other Ambulatory Visit: Payer: 59

## 2019-05-02 ENCOUNTER — Other Ambulatory Visit: Payer: Self-pay

## 2019-05-02 ENCOUNTER — Ambulatory Visit (INDEPENDENT_AMBULATORY_CARE_PROVIDER_SITE_OTHER): Payer: 59 | Admitting: *Deleted

## 2019-05-02 ENCOUNTER — Encounter: Payer: Self-pay | Admitting: *Deleted

## 2019-05-02 VITALS — BP 109/65 | HR 80 | Ht 63.0 in | Wt 132.6 lb

## 2019-05-02 DIAGNOSIS — N926 Irregular menstruation, unspecified: Secondary | ICD-10-CM | POA: Diagnosis not present

## 2019-05-02 DIAGNOSIS — Z3202 Encounter for pregnancy test, result negative: Secondary | ICD-10-CM

## 2019-05-02 LAB — POCT URINE PREGNANCY: Preg Test, Ur: NEGATIVE

## 2019-05-02 NOTE — Progress Notes (Addendum)
   NURSE VISIT- PREGNANCY CONFIRMATION   SUBJECTIVE:  Kelly Watts is a 23 y.o. G42P1101 female at [redacted]w[redacted]d by certain LMP of Patient's last menstrual period was 03/30/2019. Here for pregnancy confirmation.  Home pregnancy test: positive x 3  She reports no complaints.  She is taking prenatal vitamins.    OBJECTIVE:  BP 109/65 (BP Location: Right Arm, Patient Position: Sitting, Cuff Size: Normal)   Pulse 80   Ht 5\' 3"  (1.6 m)   Wt 132 lb 9.6 oz (60.1 kg)   LMP 03/30/2019   Breastfeeding No   BMI 23.49 kg/m   Appears well, in no apparent distress OB History  Gravida Para Term Preterm AB Living  3 2 1 1   1   SAB TAB Ectopic Multiple Live Births        0 1    # Outcome Date GA Lbr Len/2nd Weight Sex Delivery Anes PTL Lv  3 Current           2 Term 08/11/17 [redacted]w[redacted]d 15:19 / 01:00 7 lb 12.5 oz (3.53 kg) F Vag-Spont EPI  LIV  1 Preterm 07/25/12 [redacted]w[redacted]d 07:35 / 00:04 15 oz (0.425 kg) F Vag-Spont None  FD    Results for orders placed or performed in visit on 05/02/19 (from the past 24 hour(s))  POCT urine pregnancy   Collection Time: 05/02/19  4:41 PM  Result Value Ref Range   Preg Test, Ur Negative Negative    ASSESSMENT: Negative pregnancy test    PLAN: HCG today, will f/u after labs Prenatal vitamins: continue   Nausea medicines: not currently needed   OB packet given: No  Alice Rieger  05/02/2019 4:41 PM   Chart reviewed for nurse visit. Agree with plan of care.  Roma Schanz, [place] 05/02/2019 4:48 PM

## 2019-05-03 LAB — BETA HCG QUANT (REF LAB): hCG Quant: <1 m[IU]/mL

## 2019-05-06 NOTE — L&D Delivery Note (Signed)
OB/GYN Faculty Practice Delivery Note  Kelly Watts is a 24 y.o. [phone] s/p augmented vaginal delivery at [redacted]w[redacted]d. She was admitted for SOL.   ROM: 0839 on 11/3 with clear fluid GBS Status:  --/Negative (10/14 1330) Maximum Maternal Temperature: 98.1 F  Labor Progress: . Patient presented to L&D for SOL. Initial SVE: 4.5/70/-2. Was agumented with pitocin in active labor. She then progressed to complete.   Delivery Date/Time: 2326 Delivery: Called to room and patient was complete and pushing. Head position was ROA and delivered with ease over the perineum. nuchal cord present easily reduced. Shoulder and body delivered in usual fashion. Infant with spontaneous cry, placed on mother's abdomen, dried and stimulated. Cord clamped x 2 after 1-minute delay, and cut by FOB. Cord blood drawn. Placenta delivered spontaneously with gentle cord traction. Fundus firm with massage and pitocin started. Labia, perineum, vagina, and cervix inspected and significant for periureathal hemostatic abrasion.  Baby Weight: per medical chart  Cord: central insertion, 3 vessel Placenta: Sent to L&D Complications: None Lacerations: none EBL: 50cc Analgesia: Epidural   Infant: APGAR (1 MIN):   APGAR (5 MINS):    Kathrin Greathouse, MD, PGY-1 OBGYN Faculty Teaching Service  03/07/2020, 11:47 PM

## 2019-05-13 ENCOUNTER — Ambulatory Visit: Payer: 59 | Admitting: Allergy & Immunology

## 2019-06-30 ENCOUNTER — Ambulatory Visit (INDEPENDENT_AMBULATORY_CARE_PROVIDER_SITE_OTHER): Payer: 59

## 2019-06-30 ENCOUNTER — Other Ambulatory Visit: Payer: Self-pay

## 2019-06-30 VITALS — BP 110/72 | HR 81 | Ht 63.0 in | Wt 134.0 lb

## 2019-06-30 DIAGNOSIS — Z3201 Encounter for pregnancy test, result positive: Secondary | ICD-10-CM

## 2019-06-30 LAB — POCT URINE PREGNANCY: Preg Test, Ur: POSITIVE — AB

## 2019-06-30 NOTE — Progress Notes (Signed)
   NURSE VISIT- PREGNANCY CONFIRMATION   SUBJECTIVE:  Kelly Watts is a 24 y.o. G48P1101 female at [redacted] weeks pregnant. Here for pregnancy confirmation.+home pregnancy test. LMP 06-01-19  OBJECTIVE:  LMP 03/30/2019   Appears well, in no apparent distress OB History  Gravida Para Term Preterm AB Living  3 2 1 1   1   SAB TAB Ectopic Multiple Live Births        0 1    # Outcome Date GA Lbr Len/2nd Weight Sex Delivery Anes PTL Lv  3 Current           2 Term 08/11/17 [redacted]w[redacted]d 15:19 / 01:00 7 lb 12.5 oz (3.53 kg) F Vag-Spont EPI  LIV  1 Preterm 07/25/12 [redacted]w[redacted]d 07:35 / 00:04 15 oz (0.425 kg) F Vag-Spont None  FD    No results found for this or any previous visit (from the past 24 hour(s)).  ASSESSMENT:     PLAN: Schedule for dating ultrasound in 4 weeks Taking prenatal vitamins. Having no nausea.     OB packet given. Will route to [redacted]w[redacted]d.  [facility]  06/30/2019 9:50 AM

## 2019-06-30 NOTE — Progress Notes (Signed)
Chart reviewed for nurse visit. Agree with plan of care.  Adline Potter, NP 06/30/2019 1:17 PM

## 2019-07-25 ENCOUNTER — Other Ambulatory Visit: Payer: Self-pay | Admitting: Adult Health

## 2019-07-25 MED ORDER — PROMETHAZINE HCL 25 MG PO TABS: 25.0000 mg | ORAL_TABLET | Freq: Four times a day (QID) | ORAL | 1 refills | Status: DC | PRN

## 2019-07-25 NOTE — Progress Notes (Unsigned)
rx phenergan 

## 2019-07-27 ENCOUNTER — Other Ambulatory Visit: Payer: Self-pay | Admitting: Obstetrics and Gynecology

## 2019-07-27 DIAGNOSIS — O3680X0 Pregnancy with inconclusive fetal viability, not applicable or unspecified: Secondary | ICD-10-CM

## 2019-07-28 ENCOUNTER — Ambulatory Visit (INDEPENDENT_AMBULATORY_CARE_PROVIDER_SITE_OTHER): Payer: 59

## 2019-07-28 ENCOUNTER — Other Ambulatory Visit: Payer: Self-pay

## 2019-07-28 DIAGNOSIS — Z3A08 8 weeks gestation of pregnancy: Secondary | ICD-10-CM

## 2019-07-28 DIAGNOSIS — O3680X0 Pregnancy with inconclusive fetal viability, not applicable or unspecified: Secondary | ICD-10-CM | POA: Diagnosis not present

## 2019-07-28 DIAGNOSIS — O3481 Maternal care for other abnormalities of pelvic organs, first trimester: Secondary | ICD-10-CM

## 2019-07-28 NOTE — Progress Notes (Signed)
[place] 8+1 wks,single IUP with YS,CRL 18.27 mm,FHR 176 bpm,normal ovaries,left simple corpus luteal cyst 2.4 x 2.3 x 2.2 cm

## 2019-07-30 ENCOUNTER — Encounter (HOSPITAL_COMMUNITY): Payer: Self-pay

## 2019-07-30 ENCOUNTER — Emergency Department (HOSPITAL_COMMUNITY)
Admission: EM | Admit: 2019-07-30 | Discharge: 2019-07-30 | Disposition: A | Discharge status: Home / Self Care | Payer: 59 | Attending: Emergency Medicine | Admitting: Emergency Medicine

## 2019-07-30 ENCOUNTER — Other Ambulatory Visit: Payer: Self-pay

## 2019-07-30 DIAGNOSIS — N200 Calculus of kidney: Secondary | ICD-10-CM | POA: Diagnosis not present

## 2019-07-30 DIAGNOSIS — Z3A08 8 weeks gestation of pregnancy: Secondary | ICD-10-CM | POA: Insufficient documentation

## 2019-07-30 DIAGNOSIS — O99891 Other specified diseases and conditions complicating pregnancy: Secondary | ICD-10-CM | POA: Diagnosis not present

## 2019-07-30 DIAGNOSIS — Z79899 Other long term (current) drug therapy: Secondary | ICD-10-CM | POA: Diagnosis not present

## 2019-07-30 DIAGNOSIS — Z3A01 Less than 8 weeks gestation of pregnancy: Secondary | ICD-10-CM | POA: Diagnosis not present

## 2019-07-30 DIAGNOSIS — O99511 Diseases of the respiratory system complicating pregnancy, first trimester: Secondary | ICD-10-CM | POA: Insufficient documentation

## 2019-07-30 DIAGNOSIS — Z87891 Personal history of nicotine dependence: Secondary | ICD-10-CM | POA: Insufficient documentation

## 2019-07-30 DIAGNOSIS — O26831 Pregnancy related renal disease, first trimester: Secondary | ICD-10-CM | POA: Diagnosis not present

## 2019-07-30 DIAGNOSIS — R109 Unspecified abdominal pain: Secondary | ICD-10-CM

## 2019-07-30 LAB — URINALYSIS, ROUTINE W REFLEX MICROSCOPIC
Bacteria, UA: NONE SEEN
Bilirubin Urine: NEGATIVE
Glucose, UA: NEGATIVE mg/dL
Ketones, ur: NEGATIVE mg/dL
Leukocytes,Ua: NEGATIVE
Nitrite: NEGATIVE
Protein, ur: NEGATIVE mg/dL
Specific Gravity, Urine: 1.003 — ABNORMAL LOW (ref 1.005–1.030)
pH: 7 (ref 5.0–8.0)

## 2019-07-30 MED ORDER — OXYCODONE-ACETAMINOPHEN 5-325 MG PO TABS
1.0000 | ORAL_TABLET | Freq: Once | ORAL | Status: AC
  Administered 2019-07-30: 1 via ORAL
  Filled 2019-07-30: qty 1

## 2019-07-30 MED ORDER — OXYCODONE-ACETAMINOPHEN 5-325 MG PO TABS: 1.0000 | ORAL_TABLET | Freq: Four times a day (QID) | ORAL | 0 refills | Status: DC | PRN

## 2019-07-30 MED ORDER — OXYCODONE-ACETAMINOPHEN 5-325 MG PO TABS: 1.0000 | ORAL_TABLET | ORAL | 0 refills | Status: DC | PRN

## 2019-07-30 NOTE — ED Notes (Signed)
Pt reports several hours of flank pain   She reports she is pregnant and has a history of kidney stones as well as recurrent UTIs  Urine specimen obtained and to the lab

## 2019-07-30 NOTE — ED Provider Notes (Signed)
[facility] EMERGENCY DEPARTMENT Provider Note   CSN: [id number] Arrival date & time: 07/30/19  1719     History Chief Complaint  Patient presents with  . Flank Pain    Kelly Watts is a 24 y.o. female.  HPI   24 year old female with left flank pain.  The acute onset about 3 hours ago.  She has a past history of kidney stones as well as kidney infections.  Pain is fairly constant.  No appreciable exacerbating relieving factors.  She is approximately [redacted] weeks pregnant.  She denies any unusual vaginal bleeding or discharge.  She has had some prenatal care thus far including an ultrasound which showed an IUP.  She has had some morning sickness but no significant increase in nausea with these new symptoms.  No fevers or chills.  No dysuria hematuria or change in frequency.  No change in bowel movements.  She has needed a previous lithotripsy.  Past Medical History:  Diagnosis Date  . Angio-edema   . Asthma   . Eczema   . Encounter for Nexplanon removal 11/16/2015  . History of kidney stones   . History of kidney stones   . Kidney infection   . Nexplanon insertion 11/02/2012   Inserted left arm 11/02/12  . Patient underweight   . Recurrent upper respiratory infection (URI)   . Urticaria     Patient Active Problem List   Diagnosis Date Noted  . History of IUFD 08/11/2017  . History of preterm delivery 01/20/2017  . Kidney stones 10/13/2016  . Kidney cyst, acquired 10/13/2016  . Flank pain 09/13/2013    Past Surgical History:  Procedure Laterality Date  . EAR TUBE REMOVAL    . EXTRACORPOREAL SHOCK WAVE LITHOTRIPSY Left 10/30/2016   Procedure: LEFT EXTRACORPOREAL SHOCK WAVE LITHOTRIPSY (ESWL);  Surgeon: Ardis Hughs, MD;  Location: [facility];  Service: Urology;  Laterality: Left;  . TYMPANOSTOMY TUBE PLACEMENT    . WISDOM TOOTH EXTRACTION       OB History    Gravida  3   Para  2   Term  1   Preterm  1   AB      Living  1     SAB      TAB      Ectopic        Multiple  0   Live Births  1           Family History  Problem Relation Age of Onset  . Allergic rhinitis Father   . Asthma Mother   . Allergic rhinitis Mother   . Coronary artery disease Paternal Grandfather   . Cancer Paternal Grandmother        breast  . Diabetes Maternal Grandmother   . Thyroid disease Maternal Grandmother   . Heart disease Maternal Grandmother   . Melanoma Maternal Grandfather   . Allergic rhinitis Brother   . Allergic rhinitis Brother   . Angioedema Neg Hx   . Atopy Neg Hx   . Eczema Neg Hx   . Immunodeficiency Neg Hx   . Urticaria Neg Hx     Social History   Tobacco Use  . Smoking status: Former Smoker    Packs/day: 0.25    Years: 3.00    Pack years: 0.75    Types: Cigarettes    Quit date: 01/30/2016    Years since quitting: 3.4  . Smokeless tobacco: Never Used  Substance Use Topics  . Alcohol use: No  Comment: occ  . Drug use: No    Home Medications Prior to Admission medications   Medication Sig Start Date End Date Taking? Authorizing Provider  Prenatal Vit-Fe Fumarate-FA (PRENATAL VITAMIN PO) Take by mouth.    [provider]  promethazine (PHENERGAN) 25 MG tablet Take 1 tablet (25 mg total) by mouth every 6 (six) hours as needed for nausea or vomiting. 07/25/19   Adline Potter, NP    Allergies    Penicillins and Sulfa antibiotics  Review of Systems   Review of Systems All systems reviewed and negative, other than as noted in HPI.  Physical Exam Updated Vital Signs BP 105/70 (BP Location: Right Arm)   Pulse 82   Temp 97.7 F (36.5 C) (Oral)   Resp 18   Ht 5\' 3"  (1.6 m)   Wt 60.3 kg   LMP 06/01/2019   SpO2 98%   BMI 23.56 kg/m   Physical Exam Vitals and nursing note reviewed.  Constitutional:      General: She is not in acute distress.    Appearance: She is well-developed.  HENT:     Head: Normocephalic and atraumatic.  Eyes:     General:        Right eye: No discharge.        Left  eye: No discharge.     Conjunctiva/sclera: Conjunctivae normal.  Cardiovascular:     Rate and Rhythm: Normal rate and regular rhythm.     Heart sounds: Normal heart sounds. No murmur. No friction rub. No gallop.   Pulmonary:     Effort: Pulmonary effort is normal. No respiratory distress.     Breath sounds: Normal breath sounds.  Abdominal:     General: There is no distension.     Palpations: Abdomen is soft.     Tenderness: There is abdominal tenderness.     Comments: Left CVA tenderness.  Tenderness to left flank.  No rebound or guarding.  Musculoskeletal:        General: No tenderness.     Cervical back: Neck supple.  Skin:    General: Skin is warm and dry.  Neurological:     Mental Status: She is alert.  Psychiatric:        Behavior: Behavior normal.        Thought Content: Thought content normal.     ED Results / Procedures / Treatments   Labs (all labs ordered are listed, but only abnormal results are displayed) Labs Reviewed  URINALYSIS, ROUTINE W REFLEX MICROSCOPIC - Abnormal; Notable for the following components:      Result Value   Color, Urine STRAW (*)    Specific Gravity, Urine 1.003 (*)    Hgb urine dipstick SMALL (*)    All other components within normal limits    EKG None  Radiology No results found.  Procedures Procedures (including critical care time)  Medications Ordered in ED Medications  oxyCODONE-acetaminophen (PERCOCET/ROXICET) 5-325 MG per tablet 1 tablet (has no administration in time range)    ED Course  I have reviewed the triage vital signs and the nursing notes.  Pertinent labs & imaging results that were available during my care of the patient were reviewed by me and considered in my medical decision making (see chart for details).    MDM Rules/Calculators/A&P                      24 year old female with left flank pain.  Suspect ureteral colic.  History of the same.  Symptoms seem consistent with that.  Blood noted on UA.   Not really consistent with infection.  She is [redacted] weeks pregnant.  Recent ultrasound was done and I reviewed these results.  She does have an IUP.  I doubt that her pain is directly pregnancy related.   CT deferred in pregnancy as I have a low suspicion for serious alternative diagnosis at this time. [place] not available at this facility at this hour for this indication.  Advised to avoid NSAIDs.Will prescribe Percocet to take as needed.  She has already been previously prescribed Phenergan to take as needed.  Return precautions were discussed.  Outpatient follow-up otherwise.  Final Clinical Impression(s) / ED Diagnoses Final diagnoses:  Left flank pain  Kidney Uselman    Rx / DC Orders ED Discharge Orders    None       Raeford Razor, MD 07/30/19 [phone]

## 2019-07-30 NOTE — Discharge Instructions (Addendum)
I suspect you have a kidney Shatz based on your current symptoms and past history.  You have blood on your urinalysis which is typical.  You do not have a urinary tract infection. Since you are pregnant, I would not recommend a CT scan.  Unfortunately, ultrasound is not available to [place] at this hour for this indication.  The plan is to treat your symptoms and hope you pass the [place]. Take the pain medication as needed for your pain. Kidney stones are routinely treated with the medication I am prescribing you. Pregnancy is not a reason to avoid treating acute pain with an opiate if indicated. I do want you to avoid NSAIDs (ibuprofen, aleve, naprosyn, etc) though. Return to the ER if your pain is uncontrolled with the medications, you have persistent vomiting or develop a fever. Otherwise I'd like you to follow-up with urology.

## 2019-07-30 NOTE — ED Notes (Signed)
Pt verbalizes understanding of DC instruction  She is going to the bathroom  The signature pad is broken and reported in room so she cannot sign

## 2019-07-30 NOTE — ED Triage Notes (Signed)
Pt reports left flank pain x 3 hours.  Reports has history infection and kidney stones.  Reports is [redacted] weeks pregnant.

## 2019-08-23 ENCOUNTER — Other Ambulatory Visit: Payer: Self-pay | Admitting: Obstetrics and Gynecology

## 2019-08-23 DIAGNOSIS — Z3682 Encounter for antenatal screening for nuchal translucency: Secondary | ICD-10-CM

## 2019-08-24 ENCOUNTER — Encounter: Payer: Self-pay | Admitting: Advanced Practice Midwife

## 2019-08-24 ENCOUNTER — Other Ambulatory Visit: Payer: Self-pay

## 2019-08-24 ENCOUNTER — Ambulatory Visit (INDEPENDENT_AMBULATORY_CARE_PROVIDER_SITE_OTHER): Payer: 59 | Admitting: Advanced Practice Midwife

## 2019-08-24 ENCOUNTER — Ambulatory Visit (INDEPENDENT_AMBULATORY_CARE_PROVIDER_SITE_OTHER): Payer: 59

## 2019-08-24 ENCOUNTER — Ambulatory Visit: Payer: 59 | Admitting: *Deleted

## 2019-08-24 VITALS — BP 112/67 | HR 89 | Wt 130.0 lb

## 2019-08-24 DIAGNOSIS — Z6791 Unspecified blood type, Rh negative: Secondary | ICD-10-CM

## 2019-08-24 DIAGNOSIS — Z3682 Encounter for antenatal screening for nuchal translucency: Secondary | ICD-10-CM | POA: Diagnosis not present

## 2019-08-24 DIAGNOSIS — Z3480 Encounter for supervision of other normal pregnancy, unspecified trimester: Secondary | ICD-10-CM

## 2019-08-24 DIAGNOSIS — Z3A12 12 weeks gestation of pregnancy: Secondary | ICD-10-CM | POA: Diagnosis not present

## 2019-08-24 DIAGNOSIS — O26891 Other specified pregnancy related conditions, first trimester: Secondary | ICD-10-CM

## 2019-08-24 LAB — POCT URINALYSIS DIPSTICK OB
Blood, UA: NEGATIVE
Glucose, UA: NEGATIVE
Leukocytes, UA: NEGATIVE
Nitrite, UA: NEGATIVE

## 2019-08-24 NOTE — Progress Notes (Addendum)
INITIAL OBSTETRICAL VISIT Patient name: Kelly Watts MRN [id number]  Date of birth: November 11, 1995 Chief Complaint:   Initial Prenatal Visit (nausea)  History of Present Illness:   Kelly Watts is a 24 y.o. G39P1101 Caucasian female at [redacted]w[redacted]d by LMP c/w 8wk scan with an Estimated Date of Delivery: 03/07/20 being seen today for her initial obstetrical visit.   Her obstetrical history is significant for 1st preg 22wk PPROM/PTB with IUFD; 2nd preg term vag del.   Today she reports no complaints.  Depression screen [facility] 2/9 08/24/2019 09/08/2018 01/20/2017 08/25/2016  Decreased Interest 0 0 0 0  Down, Depressed, Hopeless 0 0 0 0  PHQ - 2 Score 0 0 0 0  Altered sleeping 0 - 0 -  Tired, decreased energy 1 - 0 -  Change in appetite 0 - 0 -  Feeling bad or failure about yourself  0 - 0 -  Trouble concentrating 0 - 0 -  Moving slowly or fidgety/restless 0 - 0 -  Suicidal thoughts 0 - 0 -  PHQ-9 Score 1 - 0 -  Difficult doing work/chores Not difficult at all - Not difficult at all -    Patient's last menstrual period was 06/01/2019. Last pap Sept 2018. Results were: normal Review of Systems:   Pertinent items are noted in HPI Denies cramping/contractions, leakage of fluid, vaginal bleeding, abnormal vaginal discharge w/ itching/odor/irritation, headaches, visual changes, shortness of breath, chest pain, abdominal pain, severe nausea/vomiting, or problems with urination or bowel movements unless otherwise stated above.  Pertinent History Reviewed:  Reviewed past medical,surgical, social, obstetrical and family history.  Reviewed problem list, medications and allergies. OB History  Gravida Para Term Preterm AB Living  3 2 1 1   1   SAB TAB Ectopic Multiple Live Births        0 1    # Outcome Date GA Lbr Len/2nd Weight Sex Delivery Anes PTL Lv  3 Current           2 Term 08/11/17 [redacted]w[redacted]d 15:19 / 01:00 7 lb 12.5 oz (3.53 kg) F Vag-Spont EPI N LIV  1 Preterm 07/25/12 [redacted]w[redacted]d 07:35 / 00:04 15 oz  (0.425 kg) F Vag-Spont None  FD   Physical Assessment:   Vitals:   08/24/19 1548  BP: 112/67  Pulse: 89  Weight: 130 lb (59 kg)  Body mass index is 23.03 kg/m.       Physical Examination:  General appearance - well appearing, and in no distress  Mental status - alert, oriented to person, place, and time  Psych:  She has a normal mood and affect  Skin - warm and dry, normal color, no suspicious lesions noted  Chest - effort normal, all lung fields clear to auscultation bilaterally  Heart - normal rate and regular rhythm  Abdomen - soft, nontender  Extremities:  No swelling or varicosities noted  Pelvic - VULVA: normal appearing vulva with no masses, tenderness or lesions  VAGINA: normal appearing vagina with normal color and discharge, no lesions  CERVIX: normal appearing cervix without discharge or lesions, no CMT  Thin prep pap is not done  TODAY'S NT [place] 12 wks,measurements c/w dates,crl 58.52 mm,NB present,NT 1.1 mm,fhr 165 bpm,normal right ovary,simple left corpus luteal cyst 2.6 x 1.9 x 2.1 cm  Results for orders placed or performed in visit on 08/24/19 (from the past 24 hour(s))  POC Urinalysis Dipstick OB   Collection Time: 08/24/19  4:05 PM  Result Value Ref Range  Color, UA     Clarity, UA     Glucose, UA Negative Negative   Bilirubin, UA     Ketones, UA small    Spec Grav, UA     Blood, UA neg    pH, UA     POC,PROTEIN,UA Trace Negative, Trace, Small (1+), Moderate (2+), Large (3+), 4+   Urobilinogen, UA     Nitrite, UA neg    Leukocytes, UA Negative Negative   Appearance     Odor      Assessment & Plan:  1) Low-Risk Pregnancy G3P1101 at [redacted]w[redacted]d with an Estimated Date of Delivery: 03/07/20   2) Initial OB visit  3) Hx PPROM/PTB at 22wk, pt unsure re Makena; per [facility], informed that vaginal progesterone is at least as effective for preventing preterm birth as 17P- will share with pt and she will let us know; this would also start at 16wks  (https://www.thelancet.com/journals/lancet/article/PIIS0140-6736(21)00217-8/fulltext)   4) Rh neg, Rhogam at 28wks  Meds: No orders of the defined types were placed in this encounter.   Initial labs obtained Continue prenatal vitamins Reviewed n/v relief measures and warning s/s to report Reviewed recommended weight gain based on pre-gravid BMI Encouraged well-balanced diet Genetic & carrier screening discussed: requests Panorama, requests NT/IT Ultrasound discussed; fetal survey: requested CCNC completed> form faxed if has or is planning to apply for medicaid The nature of  - [facility] [employer] with multiple MDs and other Advanced Practice Providers was explained to patient; also emphasized that fellows, residents, and students are part of our team. Ordered home bp cuff if needed.   No indications for ASA therapy or GTT (per uptodate)  Follow-up: Return in about 6 weeks (around 10/05/2019) for 2nd IT, LROB, [place]: Anatomy, in person.   Orders Placed This Encounter  Procedures  . GC/Chlamydia Probe Amp  . Urine Culture  . US OB Comp + 14 Wk  . Integrated 1  . Hgb Fractionation Cascade  . Obstetric Panel, Including HIV  . Hepatitis C antibody  . Genetic Screening  . POC Urinalysis Dipstick OB    Arabella Merles [facility] 08/24/2019 4:15 PM

## 2019-08-24 NOTE — Patient Instructions (Signed)
Kelly Watts, I greatly value your feedback.  If you receive a survey following your visit with [place] today, we appreciate you taking the time to fill it out.  Thanks, Philipp Deputy CNM   [facility] ([address] [place], [place] [address]) Entrance C, located off of [address] Free 24/7 valet parking   Nausea & Vomiting  Have saltine crackers or pretzels by your bed and eat a few bites before you raise your head out of bed in the morning  Eat small frequent meals throughout the day instead of large meals  Drink plenty of fluids throughout the day to stay hydrated, just don't drink a lot of fluids with your meals.  This can make your stomach fill up faster making you feel sick  Do not brush your teeth right after you eat  Products with real ginger are good for nausea, like ginger ale and ginger hard candy Make sure it says made with real ginger!  Sucking on sour candy like lemon heads is also good for nausea  If your prenatal vitamins make you nauseated, take them at night so you will sleep through the nausea  Sea Bands  If you feel like you need medicine for the nausea & vomiting please let us know  If you are unable to keep any fluids or food down please let us know   Constipation  Drink plenty of fluid, preferably water, throughout the day  Eat foods high in fiber such as fruits, vegetables, and grains  Exercise, such as walking, is a good way to keep your bowels regular  Drink warm fluids, especially warm prune juice, or decaf coffee  Eat a 1/2 cup of real oatmeal (not instant), 1/2 cup applesauce, and 1/2-1 cup warm prune juice every day  If needed, you may take Colace (docusate sodium) stool softener once or twice a day to help keep the stool soft.   If you still are having problems with constipation, you may take Miralax once daily as needed to help keep your bowels regular.   Home Blood Pressure Monitoring for Patients   Your  provider has recommended that you check your blood pressure (BP) at least once a week at home. If you do not have a blood pressure cuff at home, one will be provided for you. Contact your provider if you have not received your monitor within 1 week.   Helpful Tips for Accurate Home Blood Pressure Checks  . Don't smoke, exercise, or drink caffeine 30 minutes before checking your BP . Use the restroom before checking your BP (a full bladder can raise your pressure) . Relax in a comfortable upright chair . Feet on the ground . Left arm resting comfortably on a flat surface at the level of your heart . Legs uncrossed . Back supported . Sit quietly and don't talk . Place the cuff on your bare arm . Adjust snuggly, so that only two fingertips can fit between your skin and the top of the cuff . Check 2 readings separated by at least one minute . Keep a log of your BP readings . For a visual, please reference this diagram: http://ccnc.care/bpdiagram  Provider Name: [facility]     Phone: [phone]  Zone 1: ALL CLEAR  Continue to monitor your symptoms:  . BP reading is less than 140 (top number) or less than 90 (bottom number)  . No right upper stomach pain . No headaches or seeing  spots . No feeling nauseated or throwing up . No swelling in face and hands  Zone 2: CAUTION Call your doctor's office for any of the following:  . BP reading is greater than 140 (top number) or greater than 90 (bottom number)  . Stomach pain under your ribs in the middle or right side . Headaches or seeing spots . Feeling nauseated or throwing up . Swelling in face and hands  Zone 3: EMERGENCY  Seek immediate medical care if you have any of the following:  . BP reading is greater than160 (top number) or greater than 110 (bottom number) . Severe headaches not improving with Tylenol . Serious difficulty catching your breath . Any worsening symptoms from Zone 2    First Trimester of Pregnancy The  first trimester of pregnancy is from week 1 until the end of week 12 (months 1 through 3). A week after a sperm fertilizes an egg, the egg will implant on the wall of the uterus. This embryo will begin to develop into a baby. Genes from you and your partner are forming the baby. The female genes determine whether the baby is a boy or a girl. At 6-8 weeks, the eyes and face are formed, and the heartbeat can be seen on ultrasound. At the end of 12 weeks, all the baby's organs are formed.  Now that you are pregnant, you will want to do everything you can to have a healthy baby. Two of the most important things are to get good prenatal care and to follow your health care provider's instructions. Prenatal care is all the medical care you receive before the baby's birth. This care will help prevent, find, and treat any problems during the pregnancy and childbirth. BODY CHANGES Your body goes through many changes during pregnancy. The changes vary from woman to woman.   You may gain or lose a couple of pounds at first.  You may feel sick to your stomach (nauseous) and throw up (vomit). If the vomiting is uncontrollable, call your health care provider.  You may tire easily.  You may develop headaches that can be relieved by medicines approved by your health care provider.  You may urinate more often. Painful urination may mean you have a bladder infection.  You may develop heartburn as a result of your pregnancy.  You may develop constipation because certain hormones are causing the muscles that push waste through your intestines to slow down.  You may develop hemorrhoids or swollen, bulging veins (varicose veins).  Your breasts may begin to grow larger and become tender. Your nipples may stick out more, and the tissue that surrounds them (areola) may become darker.  Your gums may bleed and may be sensitive to brushing and flossing.  Dark spots or blotches (chloasma, mask of pregnancy) may develop on  your face. This will likely fade after the baby is born.  Your menstrual periods will stop.  You may have a loss of appetite.  You may develop cravings for certain kinds of food.  You may have changes in your emotions from day to day, such as being excited to be pregnant or being concerned that something may go wrong with the pregnancy and baby.  You may have more vivid and strange dreams.  You may have changes in your hair. These can include thickening of your hair, rapid growth, and changes in texture. Some women also have hair loss during or after pregnancy, or hair that feels dry or thin. Your hair  will most likely return to normal after your baby is born. WHAT TO EXPECT AT YOUR PRENATAL VISITS During a routine prenatal visit:  You will be weighed to make sure you and the baby are growing normally.  Your blood pressure will be taken.  Your abdomen will be measured to track your baby's growth.  The fetal heartbeat will be listened to starting around week 10 or 12 of your pregnancy.  Test results from any previous visits will be discussed. Your health care provider may ask you:  How you are feeling.  If you are feeling the baby move.  If you have had any abnormal symptoms, such as leaking fluid, bleeding, severe headaches, or abdominal cramping.  If you have any questions. Other tests that may be performed during your first trimester include:  Blood tests to find your blood type and to check for the presence of any previous infections. They will also be used to check for low iron levels (anemia) and Rh antibodies. Later in the pregnancy, blood tests for diabetes will be done along with other tests if problems develop.  Urine tests to check for infections, diabetes, or protein in the urine.  An ultrasound to confirm the proper growth and development of the baby.  An amniocentesis to check for possible genetic problems.  Fetal screens for spina bifida and Down  syndrome.  You may need other tests to make sure you and the baby are doing well. HOME CARE INSTRUCTIONS  Medicines  Follow your health care provider's instructions regarding medicine use. Specific medicines may be either safe or unsafe to take during pregnancy.  Take your prenatal vitamins as directed.  If you develop constipation, try taking a stool softener if your health care provider approves. Diet  Eat regular, well-balanced meals. Choose a variety of foods, such as meat or vegetable-based protein, fish, milk and low-fat dairy products, vegetables, fruits, and whole grain breads and cereals. Your health care provider will help you determine the amount of weight gain that is right for you.  Avoid raw meat and uncooked cheese. These carry germs that can cause birth defects in the baby.  Eating four or five small meals rather than three large meals a day may help relieve nausea and vomiting. If you start to feel nauseous, eating a few soda crackers can be helpful. Drinking liquids between meals instead of during meals also seems to help nausea and vomiting.  If you develop constipation, eat more high-fiber foods, such as fresh vegetables or fruit and whole grains. Drink enough fluids to keep your urine clear or pale yellow. Activity and Exercise  Exercise only as directed by your health care provider. Exercising will help you:  Control your weight.  Stay in shape.  Be prepared for labor and delivery.  Experiencing pain or cramping in the lower abdomen or low back is a good sign that you should stop exercising. Check with your health care provider before continuing normal exercises.  Try to avoid standing for long periods of time. Move your legs often if you must stand in one place for a long time.  Avoid heavy lifting.  Wear low-heeled shoes, and practice good posture.  You may continue to have sex unless your health care provider directs you otherwise. Relief of Pain or  Discomfort  Wear a good support bra for breast tenderness.    Take warm sitz baths to soothe any pain or discomfort caused by hemorrhoids. Use hemorrhoid cream if your health care provider approves.  Rest with your legs elevated if you have leg cramps or low back pain.  If you develop varicose veins in your legs, wear support hose. Elevate your feet for 15 minutes, 3-4 times a day. Limit salt in your diet. Prenatal Care  Schedule your prenatal visits by the twelfth week of pregnancy. They are usually scheduled monthly at first, then more often in the last 2 months before delivery.  Write down your questions. Take them to your prenatal visits.  Keep all your prenatal visits as directed by your health care provider. Safety  Wear your seat belt at all times when driving.  Make a list of emergency phone numbers, including numbers for family, friends, the hospital, and police and fire departments. General Tips  Ask your health care provider for a referral to a local prenatal education class. Begin classes no later than at the beginning of month 6 of your pregnancy.  Ask for help if you have counseling or nutritional needs during pregnancy. Your health care provider can offer advice or refer you to specialists for help with various needs.  Do not use hot tubs, steam rooms, or saunas.  Do not douche or use tampons or scented sanitary pads.  Do not cross your legs for long periods of time.  Avoid cat litter boxes and soil used by cats. These carry germs that can cause birth defects in the baby and possibly loss of the fetus by miscarriage or stillbirth.  Avoid all smoking, herbs, alcohol, and medicines not prescribed by your health care provider. Chemicals in these affect the formation and growth of the baby.  Schedule a dentist appointment. At home, brush your teeth with a soft toothbrush and be gentle when you floss. SEEK MEDICAL CARE IF:   You have dizziness.  You have mild  pelvic cramps, pelvic pressure, or nagging pain in the abdominal area.  You have persistent nausea, vomiting, or diarrhea.  You have a bad smelling vaginal discharge.  You have pain with urination.  You notice increased swelling in your face, hands, legs, or ankles. SEEK IMMEDIATE MEDICAL CARE IF:   You have a fever.  You are leaking fluid from your vagina.  You have spotting or bleeding from your vagina.  You have severe abdominal cramping or pain.  You have rapid weight gain or loss.  You vomit blood or material that looks like coffee grounds.  You are exposed to [place] measles and have never had them.  You are exposed to fifth disease or chickenpox.  You develop a severe headache.  You have shortness of breath.  You have any kind of trauma, such as from a fall or a car accident. Document Released: 04/15/2001 Document Revised: 09/05/2013 Document Reviewed: 03/01/2013 [facility] Patient Information 2015 [place], [place]. This information is not intended to replace advice given to you by your health care provider. Make sure you discuss any questions you have with your health care provider.

## 2019-08-24 NOTE — Progress Notes (Signed)
[place] 12 wks,measurements c/w dates,crl 58.52 mm,NB present,NT 1.1 mm.,fhr 165 bpm,normal right ovary,simple left corpus luteal cyst 2.6 x 1.9 x 2.1 cm

## 2019-08-25 LAB — GC/CHLAMYDIA PROBE AMP
Chlamydia trachomatis, NAA: NEGATIVE
Neisseria Gonorrhoeae by PCR: NEGATIVE

## 2019-08-25 LAB — SPECIMEN STATUS REPORT

## 2019-08-26 LAB — INTEGRATED 1
Crown Rump Length: 58.5 mm
Gest. Age on Collection Date: 12.1 weeks
Maternal Age at EDD: 24.6 yr
Nuchal Translucency (NT): 1.1 mm
Number of Fetuses: 1
PAPP-A Value: 1907.5 ng/mL
Weight: 130 [lb_av]

## 2019-08-26 LAB — OBSTETRIC PANEL, INCLUDING HIV
Antibody Screen: NEGATIVE
Basophils Absolute: 0 10*3/uL (ref 0.0–0.2)
Basos: 0 %
EOS (ABSOLUTE): 0 10*3/uL (ref 0.0–0.4)
Eos: 0 %
HIV Screen 4th Generation wRfx: NONREACTIVE
Hematocrit: 36.8 % (ref 34.0–46.6)
Hemoglobin: 12.9 g/dL (ref 11.1–15.9)
Hepatitis B Surface Ag: NEGATIVE
Immature Grans (Abs): 0 10*3/uL (ref 0.0–0.1)
Immature Granulocytes: 0 %
Lymphocytes Absolute: 2 10*3/uL (ref 0.7–3.1)
Lymphs: 21 %
MCH: 30.9 pg (ref 26.6–33.0)
MCHC: 35.1 g/dL (ref 31.5–35.7)
MCV: 88 fL (ref 79–97)
Monocytes Absolute: 0.7 10*3/uL (ref 0.1–0.9)
Monocytes: 8 %
Neutrophils Absolute: 6.7 10*3/uL (ref 1.4–7.0)
Neutrophils: 71 %
Platelets: 308 10*3/uL (ref 150–450)
RBC: 4.17 x10E6/uL (ref 3.77–5.28)
RDW: 12 % (ref 11.7–15.4)
RPR Ser Ql: NONREACTIVE
Rh Factor: NEGATIVE
Rubella Antibodies, IGG: 2.49 index (ref >0.99)
WBC: 9.5 10*3/uL (ref 3.4–10.8)

## 2019-08-26 LAB — HEPATITIS C ANTIBODY: Hep C Virus Ab: <0.1 s/co ratio (ref 0.0–0.9)

## 2019-08-26 LAB — URINE CULTURE: Organism ID, Bacteria: NO GROWTH

## 2019-08-26 LAB — HGB FRACTIONATION CASCADE
Hgb A2: 2.9 % (ref 1.8–3.2)
Hgb A: 97.1 % (ref 96.4–98.8)
Hgb F: 0 % (ref 0.0–2.0)
Hgb S: 0 %

## 2019-08-26 LAB — SPECIMEN STATUS REPORT

## 2019-09-06 DIAGNOSIS — Z3481 Encounter for supervision of other normal pregnancy, first trimester: Secondary | ICD-10-CM | POA: Diagnosis not present

## 2019-10-04 ENCOUNTER — Other Ambulatory Visit: Payer: Self-pay | Admitting: Advanced Practice Midwife

## 2019-10-04 DIAGNOSIS — Z363 Encounter for antenatal screening for malformations: Secondary | ICD-10-CM

## 2019-10-04 DIAGNOSIS — Z3480 Encounter for supervision of other normal pregnancy, unspecified trimester: Secondary | ICD-10-CM

## 2019-10-05 ENCOUNTER — Ambulatory Visit (INDEPENDENT_AMBULATORY_CARE_PROVIDER_SITE_OTHER): Payer: 59

## 2019-10-05 ENCOUNTER — Ambulatory Visit (INDEPENDENT_AMBULATORY_CARE_PROVIDER_SITE_OTHER): Payer: 59 | Admitting: Family Medicine

## 2019-10-05 ENCOUNTER — Encounter: Payer: Self-pay | Admitting: Family Medicine

## 2019-10-05 ENCOUNTER — Other Ambulatory Visit: Payer: Self-pay

## 2019-10-05 VITALS — BP 83/57 | HR 58 | Wt 125.0 lb

## 2019-10-05 DIAGNOSIS — Z8751 Personal history of pre-term labor: Secondary | ICD-10-CM

## 2019-10-05 DIAGNOSIS — Z3480 Encounter for supervision of other normal pregnancy, unspecified trimester: Secondary | ICD-10-CM | POA: Diagnosis not present

## 2019-10-05 DIAGNOSIS — Z3482 Encounter for supervision of other normal pregnancy, second trimester: Secondary | ICD-10-CM | POA: Diagnosis not present

## 2019-10-05 DIAGNOSIS — Z1389 Encounter for screening for other disorder: Secondary | ICD-10-CM

## 2019-10-05 DIAGNOSIS — Z3A18 18 weeks gestation of pregnancy: Secondary | ICD-10-CM

## 2019-10-05 DIAGNOSIS — Z6791 Unspecified blood type, Rh negative: Secondary | ICD-10-CM

## 2019-10-05 DIAGNOSIS — O09212 Supervision of pregnancy with history of pre-term labor, second trimester: Secondary | ICD-10-CM

## 2019-10-05 DIAGNOSIS — Z331 Pregnant state, incidental: Secondary | ICD-10-CM

## 2019-10-05 DIAGNOSIS — Z363 Encounter for antenatal screening for malformations: Secondary | ICD-10-CM

## 2019-10-05 DIAGNOSIS — O26891 Other specified pregnancy related conditions, first trimester: Secondary | ICD-10-CM

## 2019-10-05 LAB — POCT URINALYSIS DIPSTICK OB
Blood, UA: NEGATIVE
Glucose, UA: NEGATIVE
Ketones, UA: NEGATIVE
Leukocytes, UA: NEGATIVE
Nitrite, UA: NEGATIVE
POC,PROTEIN,UA: NEGATIVE

## 2019-10-05 MED ORDER — PROGESTERONE 200 MG VA SUPP: 200.0000 mg | Freq: Every day | VAGINAL | 3 refills | Status: DC

## 2019-10-05 NOTE — Progress Notes (Signed)
   PRENATAL VISIT NOTE  Subjective:  Kelly Watts is a 24 y.o. G3P1101 at [redacted]w[redacted]d being seen today for ongoing prenatal care.  She is currently monitored for the following issues for this low-risk pregnancy and has Rh negative status during pregnancy; Kidney stones; Kidney cyst, acquired; History of preterm delivery; and Encounter for supervision of other normal pregnancy, unspecified trimester on their problem list.  Patient reports no complaints.  Contractions: Not present. Vag. Bleeding: None.  Movement: Present. Denies leaking of fluid.   The following portions of the patient's history were reviewed and updated as appropriate: allergies, current medications, past family history, past medical history, past social history, past surgical history and problem list.   Objective:   Vitals:   10/05/19 1553  BP: (!) 83/57  Pulse: (!) 58  Weight: 125 lb (56.7 kg)    Fetal Status: Fetal Heart Rate (bpm): 148   Movement: Present     General:  Alert, oriented and cooperative. Patient is in no acute distress.  Skin: Skin is warm and dry. No rash noted.   Cardiovascular: Normal heart rate noted  Respiratory: Normal respiratory effort, no problems with respiration noted  Abdomen: Soft, gravid, appropriate for gestational age.  Pain/Pressure: Absent     Pelvic: Cervical exam deferred        Extremities: Normal range of motion.  Edema: None  Mental Status: Normal mood and affect. Normal behavior. Normal judgment and thought content.   Assessment and Plan:  Pregnancy: G3P1101 at [redacted]w[redacted]d [name] was seen today for routine prenatal visit.  Diagnoses and all orders for this visit:  Encounter for supervision of other normal pregnancy, unspecified trimester -     INTEGRATED 2  [redacted] weeks gestation of pregnancy -     INTEGRATED 2  Rh negative status during pregnancy in first trimester  History of preterm delivery -     progesterone 200 MG SUPP; Place 1 suppository (200 mg total) vaginally at  bedtime. - Discussed in detail. Patient has private insurance which will not cover Makena which is very expensive. Vag progesterone equally effective. Called pharmacy and they quoted approx $50 for 30 day supply which patient feels is reasonable.  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 Please refer to After Visit Summary for other counseling recommendations.   Return in about 4 weeks (around 11/02/2019) for LOB; in person or virtual per patient preference.  No future appointments.  Joselyn Arrow, MD

## 2019-10-05 NOTE — Progress Notes (Signed)
[place] 18 wks,cephalic,cx 4.8 cm,posterior placenta gr 0,SVP of fluid 4.6 cm,simple left corpus luteal cyst 2.4 x 2.2 x 1.7 cm,normal right ovary,fhr 150 bpm,EFW 218 g 44%,antomy complete,no obvious abnormalities

## 2019-10-07 LAB — INTEGRATED 2
AFP MoM: 1.11
Alpha-Fetoprotein: 52.3 ng/mL
Crown Rump Length: 58.5 mm
DIA MoM: 0.91
DIA Value: 156.1 pg/mL
Estriol, Unconjugated: 1.94 ng/mL
Gest. Age on Collection Date: 12.1 weeks
Gestational Age: 18.1 weeks
Maternal Age at EDD: 24.6 yr
Nuchal Translucency (NT): 1.1 mm
Nuchal Translucency MoM: 0.84
Number of Fetuses: 1
PAPP-A MoM: 1.85
PAPP-A Value: 1907.5 ng/mL
Test Results:: NEGATIVE
Weight: 130 [lb_av]
Weight: 130 [lb_av]
hCG MoM: 1.55
hCG Value: 41.3 IU/mL
uE3 MoM: 1.28

## 2019-11-02 ENCOUNTER — Ambulatory Visit (INDEPENDENT_AMBULATORY_CARE_PROVIDER_SITE_OTHER): Payer: 59 | Admitting: Advanced Practice Midwife

## 2019-11-02 ENCOUNTER — Other Ambulatory Visit: Payer: Self-pay

## 2019-11-02 VITALS — BP 100/57 | HR 75 | Wt 128.0 lb

## 2019-11-02 DIAGNOSIS — O360920 Maternal care for other rhesus isoimmunization, second trimester, not applicable or unspecified: Secondary | ICD-10-CM

## 2019-11-02 DIAGNOSIS — Z1389 Encounter for screening for other disorder: Secondary | ICD-10-CM

## 2019-11-02 DIAGNOSIS — Z3A22 22 weeks gestation of pregnancy: Secondary | ICD-10-CM

## 2019-11-02 DIAGNOSIS — Z3480 Encounter for supervision of other normal pregnancy, unspecified trimester: Secondary | ICD-10-CM

## 2019-11-02 DIAGNOSIS — Z6791 Unspecified blood type, Rh negative: Secondary | ICD-10-CM

## 2019-11-02 NOTE — Progress Notes (Signed)
° °  LOW-RISK PREGNANCY VISIT Patient name: Kelly Watts MRN [id number]  Date of birth: 1995-07-28 Chief Complaint:   Routine Prenatal Visit  History of Present Illness:   Kelly Watts is a 24 y.o. G2P1101 female at [redacted]w[redacted]d with an Estimated Date of Delivery: 03/07/20 being seen today for ongoing management of a low-risk pregnancy.  Today she reports doing well; asked re IOL at 39wks again (most likely from hx 22wk PPROM). Contractions: Not present. Vag. Bleeding: None.  Movement: Present. denies leaking of fluid. Review of Systems:   Pertinent items are noted in HPI Denies abnormal vaginal discharge w/ itching/odor/irritation, headaches, visual changes, shortness of breath, chest pain, abdominal pain, severe nausea/vomiting, or problems with urination or bowel movements unless otherwise stated above. Pertinent History Reviewed:  Reviewed past medical,surgical, social, obstetrical and family history.  Reviewed problem list, medications and allergies. Physical Assessment:   Vitals:   11/02/19 1525  BP: (!) 100/57  Pulse: 75  Weight: 128 lb (58.1 kg)  Body mass index is 22.67 kg/m.        Physical Examination:   General appearance: Well appearing, and in no distress  Mental status: Alert, oriented to person, place, and time  Skin: Warm & dry  Cardiovascular: Normal heart rate noted  Respiratory: Normal respiratory effort, no distress  Abdomen: Soft, gravid, nontender  Pelvic: Cervical exam deferred         Extremities: Edema: None  Fetal Status: Fetal Heart Rate (bpm): 148 Fundal Height: 22 cm Movement: Present    No results found for this or any previous visit (from the past 24 hour(s)).  Assessment & Plan:  1) Low-risk pregnancy G3P1101 at [redacted]w[redacted]d with an Estimated Date of Delivery: 03/07/20   2) Rh neg, Rhogam at 28wks  3) Hx PPROM, doing well with vag progesterone; won't plan induction this time unless a medical complication presents   Meds: No orders of the defined types  were placed in this encounter.  Labs/procedures today: none  Plan:  Continue routine obstetrical care with PN2 at next visit  Reviewed: Preterm labor symptoms and general obstetric precautions including but not limited to vaginal bleeding, contractions, leaking of fluid and fetal movement were reviewed in detail with the patient.  All questions were answered. Has home bp cuff. Check bp weekly, let us know if >140/90.   Follow-up: Return in about 4 weeks (around 11/30/2019) for LROB, PN2, in person.  Orders Placed This Encounter  Procedures   POC Urinalysis Dipstick OB   Arabella Merles [facility] 11/02/2019 4:03 PM

## 2019-11-02 NOTE — Patient Instructions (Signed)
[name], I greatly value your feedback.  If you receive a survey following your visit with [place] today, we appreciate you taking the time to fill it out.  Thanks, Philipp Deputy, CNM   You will have your sugar test next visit.  Please do not eat or drink anything after midnight the night before you come, not even water.  You will be here for at least two hours.  Please make an appointment online for the bloodwork at [email or web address] for 8:30am (or as close to this as possible). Make sure you select the [facility] service center. The day of the appointment, check in with our office first, then you will go to [facility] to start the sugar test.    [facility] HAS MOVED!!! It is now [facility] ([address] [place], [place] [address]) Entrance C, located off of [address] valet parking  Go to [email or web address] to register for FREE online childbirth classes   Call the office [phone]) or go to [facility] if:  You begin to have strong, frequent contractions  Your water breaks.  Sometimes it is a big gush of fluid, sometimes it is just a trickle that keeps getting your panties wet or running down your legs  You have vaginal bleeding.  It is normal to have a small amount of spotting if your cervix was checked.   You don't feel your baby moving like normal.  If you don't, get you something to eat and drink and lay down and focus on feeling your baby move.   If your baby is still not moving like normal, you should call the office or go to [facility].  [place] Pediatricians/Family Doctors:  [facility] [phone]            [facility] [phone]                 [facility] [phone] (usually not accepting new patients unless you have family there already, you are always welcome to call and ask)       [facility] Department [phone]       [facility] Pediatricians/Family Doctors:    [facility]: [phone]  [facility]: [phone]  [facility]: [phone]  [facility] Doctors:   [facility]: [phone]   [facility]: [phone]  [facility] Doctors:  [facility]: [phone]   Home Blood Pressure Monitoring for Patients   Your provider has recommended that you check your blood pressure (BP) at least once a week at home. If you do not have a blood pressure cuff at home, one will be provided for you. Contact your provider if you have not received your monitor within 1 week.   Helpful Tips for Accurate Home Blood Pressure Checks  . Don't smoke, exercise, or drink caffeine 30 minutes before checking your BP . Use the restroom before checking your BP (a full bladder can raise your pressure) . Relax in a comfortable upright chair . Feet on the ground . Left arm resting comfortably on a flat surface at the level of your heart . Legs uncrossed . Back supported . Sit quietly and don't talk . Place the cuff on your bare arm . Adjust snuggly, so that only two fingertips can fit between your skin and the top of the cuff . Check 2 readings separated by at least one minute . Keep a log of your BP  readings . For a visual, please reference this diagram: http://ccnc.care/bpdiagram  Provider Name: [facility]     Phone: [phone]  Zone 1: ALL CLEAR  Continue to monitor your symptoms:  . BP reading is less than 140 (top number) or less than 90 (bottom number)  . No right upper stomach pain . No headaches or seeing spots . No feeling nauseated or throwing up . No swelling in face and hands  Zone 2: CAUTION Call your doctor's office for any of the following:  . BP reading is greater than 140 (top number) or greater than 90 (bottom number)  . Stomach pain under your ribs in the middle or right side . Headaches or seeing spots . Feeling  nauseated or throwing up . Swelling in face and hands  Zone 3: EMERGENCY  Seek immediate medical care if you have any of the following:  . BP reading is greater than160 (top number) or greater than 110 (bottom number) . Severe headaches not improving with Tylenol . Serious difficulty catching your breath . Any worsening symptoms from Zone 2   Second Trimester of Pregnancy The second trimester is from week 13 through week 28, months 4 through 6. The second trimester is often a time when you feel your best. Your body has also adjusted to being pregnant, and you begin to feel better physically. Usually, morning sickness has lessened or quit completely, you may have more energy, and you may have an increase in appetite. The second trimester is also a time when the fetus is growing rapidly. At the end of the sixth month, the fetus is about 9 inches long and weighs about 1 pounds. You will likely begin to feel the baby move (quickening) between 18 and 20 weeks of the pregnancy. BODY CHANGES Your body goes through many changes during pregnancy. The changes vary from woman to woman.   Your weight will continue to increase. You will notice your lower abdomen bulging out.  You may begin to get stretch marks on your hips, abdomen, and breasts.  You may develop headaches that can be relieved by medicines approved by your health care provider.  You may urinate more often because the fetus is pressing on your bladder.  You may develop or continue to have heartburn as a result of your pregnancy.  You may develop constipation because certain hormones are causing the muscles that push waste through your intestines to slow down.  You may develop hemorrhoids or swollen, bulging veins (varicose veins).  You may have back pain because of the weight gain and pregnancy hormones relaxing your joints between the bones in your pelvis and as a result of a shift in weight and the muscles that support your  balance.  Your breasts will continue to grow and be tender.  Your gums may bleed and may be sensitive to brushing and flossing.  Dark spots or blotches (chloasma, mask of pregnancy) may develop on your face. This will likely fade after the baby is born.  A dark line from your belly button to the pubic area (linea nigra) may appear. This will likely fade after the baby is born.  You may have changes in your hair. These can include thickening of your hair, rapid growth, and changes in texture. Some women also have hair loss during or after pregnancy, or hair that feels dry or thin. Your hair will most likely return to normal after your baby is born. WHAT TO EXPECT AT YOUR PRENATAL VISITS During  a routine prenatal visit:  You will be weighed to make sure you and the fetus are growing normally.  Your blood pressure will be taken.  Your abdomen will be measured to track your baby's growth.  The fetal heartbeat will be listened to.  Any test results from the previous visit will be discussed. Your health care provider may ask you:  How you are feeling.  If you are feeling the baby move.  If you have had any abnormal symptoms, such as leaking fluid, bleeding, severe headaches, or abdominal cramping.  If you have any questions. Other tests that may be performed during your second trimester include:  Blood tests that check for:  Low iron levels (anemia).  Gestational diabetes (between 24 and 28 weeks).  Rh antibodies.  Urine tests to check for infections, diabetes, or protein in the urine.  An ultrasound to confirm the proper growth and development of the baby.  An amniocentesis to check for possible genetic problems.  Fetal screens for spina bifida and Down syndrome. HOME CARE INSTRUCTIONS   Avoid all smoking, herbs, alcohol, and unprescribed drugs. These chemicals affect the formation and growth of the baby.  Follow your health care provider's instructions regarding  medicine use. There are medicines that are either safe or unsafe to take during pregnancy.  Exercise only as directed by your health care provider. Experiencing uterine cramps is a good sign to stop exercising.  Continue to eat regular, healthy meals.  Wear a good support bra for breast tenderness.  Do not use hot tubs, steam rooms, or saunas.  Wear your seat belt at all times when driving.  Avoid raw meat, uncooked cheese, cat litter boxes, and soil used by cats. These carry germs that can cause birth defects in the baby.  Take your prenatal vitamins.  Try taking a stool softener (if your health care provider approves) if you develop constipation. Eat more high-fiber foods, such as fresh vegetables or fruit and whole grains. Drink plenty of fluids to keep your urine clear or pale yellow.  Take warm sitz baths to soothe any pain or discomfort caused by hemorrhoids. Use hemorrhoid cream if your health care provider approves.  If you develop varicose veins, wear support hose. Elevate your feet for 15 minutes, 3-4 times a day. Limit salt in your diet.  Avoid heavy lifting, wear low heel shoes, and practice good posture.  Rest with your legs elevated if you have leg cramps or low back pain.  Visit your dentist if you have not gone yet during your pregnancy. Use a soft toothbrush to brush your teeth and be gentle when you floss.  A sexual relationship may be continued unless your health care provider directs you otherwise.  Continue to go to all your prenatal visits as directed by your health care provider. SEEK MEDICAL CARE IF:   You have dizziness.  You have mild pelvic cramps, pelvic pressure, or nagging pain in the abdominal area.  You have persistent nausea, vomiting, or diarrhea.  You have a bad smelling vaginal discharge.  You have pain with urination. SEEK IMMEDIATE MEDICAL CARE IF:   You have a fever.  You are leaking fluid from your vagina.  You have spotting or  bleeding from your vagina.  You have severe abdominal cramping or pain.  You have rapid weight gain or loss.  You have shortness of breath with chest pain.  You notice sudden or extreme swelling of your face, hands, ankles, feet, or legs.  You  have not felt your baby move in over an hour.  You have severe headaches that do not go away with medicine.  You have vision changes. Document Released: 04/15/2001 Document Revised: 04/26/2013 Document Reviewed: 06/22/2012 [facility] Patient Information 2015 [place], [place]. This information is not intended to replace advice given to you by your health care provider. Make sure you discuss any questions you have with your health care provider.

## 2019-11-22 ENCOUNTER — Encounter: Payer: Self-pay | Admitting: *Deleted

## 2019-11-22 DIAGNOSIS — Z3480 Encounter for supervision of other normal pregnancy, unspecified trimester: Secondary | ICD-10-CM

## 2019-12-05 ENCOUNTER — Encounter: Payer: Self-pay | Admitting: Women's Health

## 2019-12-05 ENCOUNTER — Ambulatory Visit (INDEPENDENT_AMBULATORY_CARE_PROVIDER_SITE_OTHER): Payer: 59 | Admitting: Women's Health

## 2019-12-05 ENCOUNTER — Other Ambulatory Visit: Payer: 59

## 2019-12-05 VITALS — BP 116/67 | HR 83 | Wt 132.0 lb

## 2019-12-05 DIAGNOSIS — Z23 Encounter for immunization: Secondary | ICD-10-CM

## 2019-12-05 DIAGNOSIS — Z1389 Encounter for screening for other disorder: Secondary | ICD-10-CM

## 2019-12-05 DIAGNOSIS — Z3482 Encounter for supervision of other normal pregnancy, second trimester: Secondary | ICD-10-CM

## 2019-12-05 DIAGNOSIS — Z3A26 26 weeks gestation of pregnancy: Secondary | ICD-10-CM | POA: Diagnosis not present

## 2019-12-05 DIAGNOSIS — Z331 Pregnant state, incidental: Secondary | ICD-10-CM

## 2019-12-05 DIAGNOSIS — Z3480 Encounter for supervision of other normal pregnancy, unspecified trimester: Secondary | ICD-10-CM

## 2019-12-05 LAB — POCT URINALYSIS DIPSTICK OB
Blood, UA: NEGATIVE
Glucose, UA: NEGATIVE
Ketones, UA: NEGATIVE
Leukocytes, UA: NEGATIVE
Nitrite, UA: NEGATIVE
POC,PROTEIN,UA: NEGATIVE

## 2019-12-05 NOTE — Progress Notes (Signed)
LOW-RISK PREGNANCY VISIT Patient name: Kelly Watts MRN [id number]  Date of birth: 06/06/1995 Chief Complaint:   Routine Prenatal Visit (PN2)  History of Present Illness:   Kelly Watts is a 24 y.o. G44P1101 female at [redacted]w[redacted]d with an Estimated Date of Delivery: 03/07/20 being seen today for ongoing management of a low-risk pregnancy.  Depression screen [facility] 2/9 12/05/2019 08/24/2019 09/08/2018 01/20/2017 08/25/2016  Decreased Interest 0 0 0 0 0  Down, Depressed, Hopeless 0 0 0 0 0  PHQ - 2 Score 0 0 0 0 0  Altered sleeping 0 0 - 0 -  Tired, decreased energy 0 1 - 0 -  Change in appetite 0 0 - 0 -  Feeling bad or failure about yourself  0 0 - 0 -  Trouble concentrating 0 0 - 0 -  Moving slowly or fidgety/restless 0 0 - 0 -  Suicidal thoughts 0 0 - 0 -  PHQ-9 Score 0 1 - 0 -  Difficult doing work/chores - Not difficult at all - Not difficult at all -    Today she reports lower abd pain, mainly at night after she's been on feet all day. Contractions: Not present. Vag. Bleeding: None.  Movement: Present. denies leaking of fluid. Review of Systems:   Pertinent items are noted in HPI Denies abnormal vaginal discharge w/ itching/odor/irritation, headaches, visual changes, shortness of breath, chest pain, abdominal pain, severe nausea/vomiting, or problems with urination or bowel movements unless otherwise stated above. Pertinent History Reviewed:  Reviewed past medical,surgical, social, obstetrical and family history.  Reviewed problem list, medications and allergies. Physical Assessment:   Vitals:   12/05/19 0905  BP: 116/67  Pulse: 83  Weight: 132 lb (59.9 kg)  Body mass index is 23.38 kg/m.        Physical Examination:   General appearance: Well appearing, and in no distress  Mental status: Alert, oriented to person, place, and time  Skin: Warm & dry  Cardiovascular: Normal heart rate noted  Respiratory: Normal respiratory effort, no distress  Abdomen: Soft, gravid,  nontender  Pelvic: Cervical exam deferred         Extremities: Edema: None  Fetal Status: Fetal Heart Rate (bpm): 151 Fundal Height: 27 cm Movement: Present    Chaperone: n/a    Results for orders placed or performed in visit on 12/05/19 (from the past 24 hour(s))  POC Urinalysis Dipstick OB   Collection Time: 12/05/19  9:13 AM  Result Value Ref Range   Color, UA     Clarity, UA     Glucose, UA Negative Negative   Bilirubin, UA     Ketones, UA neg    Spec Grav, UA     Blood, UA neg    pH, UA     POC,PROTEIN,UA Negative Negative, Trace, Small (1+), Moderate (2+), Large (3+), 4+   Urobilinogen, UA     Nitrite, UA neg    Leukocytes, UA Negative Negative   Appearance     Odor      Assessment & Plan:  1) Low-risk pregnancy G3P1101 at [redacted]w[redacted]d with an Estimated Date of Delivery: 03/07/20   2) H/O 22wk PTB d/t PPROM/prolapsed cord, IUFD, on vag progesterone  3) Lower abd pain at night> try belt/tape when on feet during day   Meds: No orders of the defined types were placed in this encounter.  Labs/procedures today: pn2, tdap  Plan:  Continue routine obstetrical care  Next visit: prefers will be in person for rhogam  Reviewed: Preterm labor symptoms and general obstetric precautions including but not limited to vaginal bleeding, contractions, leaking of fluid and fetal movement were reviewed in detail with the patient.  All questions were answered. Does not have home bp cuff.   Follow-up: Return in about 4 weeks (around 01/02/2020) for LROB, in person, CNM, rhogam.  Orders Placed This Encounter  Procedures  . Tdap vaccine greater than or equal to 7yo IM  . POC Urinalysis Dipstick OB   Cheral Marker CNM, [facility] 12/05/2019 9:39 AM

## 2019-12-05 NOTE — Patient Instructions (Signed)
Emelee J Reardon, I greatly value your feedback.  If you receive a survey following your visit with us today, we appreciate you taking the time to fill it out.  Thanks, Kim Ashrita Chrismer, CNM, WHNP-BC   [facility] ([address] [place], [place] [address]) Entrance C, located off of [address] Free 24/7 valet parking  Go to [email or web address] to register for FREE online childbirth classes   Call the office [phone]) or go to [facility] if:  You begin to have strong, frequent contractions  Your water breaks.  Sometimes it is a big gush of fluid, sometimes it is just a trickle that keeps getting your panties wet or running down your legs  You have vaginal bleeding.  It is normal to have a small amount of spotting if your cervix was checked.   You don't feel your baby moving like normal.  If you don't, get you something to eat and drink and lay down and focus on feeling your baby move.  You should feel at least 10 movements in 2 hours.  If you don't, you should call the office or go to [facility].    Tdap Vaccine  It is recommended that you get the Tdap vaccine during the third trimester of EACH pregnancy to help protect your baby from getting pertussis (whooping cough)  27-36 weeks is the BEST time to do this so that you can pass the protection on to your baby. During pregnancy is better than after pregnancy, but if you are unable to get it during pregnancy it will be offered at the hospital.   You can get this vaccine with us, at the health department, your family doctor, or some local pharmacies  Everyone who will be around your baby should also be up-to-date on their vaccines before the baby comes. Adults (who are not pregnant) only need 1 dose of Tdap during adulthood.   [place] Pediatricians/Family Doctors:  [facility] [phone]            [facility] [phone]                  Family Medicine  [phone] (usually not accepting new patients unless you have family there already, you are always welcome to call and ask)       [facility] [phone]       [place] Pediatricians/Family Doctors:   [facility]: [phone]  [facility]: [phone]  [facility]: [phone]  [place] Family Doctors:   [facility]: [phone]   [facility]: [phone]  [place] Family Doctors:  [facility]: [phone]   Home Blood Pressure Monitoring for Patients   Your provider has recommended that you check your blood pressure (BP) at least once a week at home. If you do not have a blood pressure cuff at home, one will be provided for you. Contact your provider if you have not received your monitor within 1 week.   Helpful Tips for Accurate Home Blood Pressure Checks  . Don't smoke, exercise, or drink caffeine 30 minutes before checking your BP . Use the restroom before checking your BP (a full bladder can raise your pressure) . Relax in a comfortable upright chair . Feet on the ground . Left arm resting comfortably on a flat surface at the level of your heart . Legs uncrossed . Back supported . Sit quietly and don't talk . Place the cuff on your   bare arm . Adjust snuggly, so that only two fingertips can fit between your skin and the top of the cuff . Check 2 readings separated by at least one minute . Keep a log of your BP readings . For a visual, please reference this diagram: http://ccnc.care/bpdiagram  Provider Name: [facility]     Phone: [phone]  Zone 1: ALL CLEAR  Continue to monitor your symptoms:  . BP reading is less than 140 (top number) or less than 90 (bottom number)  . No right upper stomach pain . No headaches or seeing spots . No feeling nauseated or throwing up . No swelling in face and hands  Zone 2: CAUTION Call your  doctor's office for any of the following:  . BP reading is greater than 140 (top number) or greater than 90 (bottom number)  . Stomach pain under your ribs in the middle or right side . Headaches or seeing spots . Feeling nauseated or throwing up . Swelling in face and hands  Zone 3: EMERGENCY  Seek immediate medical care if you have any of the following:  . BP reading is greater than160 (top number) or greater than 110 (bottom number) . Severe headaches not improving with Tylenol . Serious difficulty catching your breath . Any worsening symptoms from Zone 2   Third Trimester of Pregnancy The third trimester is from week 29 through week 42, months 7 through 9. The third trimester is a time when the fetus is growing rapidly. At the end of the ninth month, the fetus is about 20 inches in length and weighs 6-10 pounds.  BODY CHANGES Your body goes through many changes during pregnancy. The changes vary from woman to woman.   Your weight will continue to increase. You can expect to gain 25-35 pounds (11-16 kg) by the end of the pregnancy.  You may begin to get stretch marks on your hips, abdomen, and breasts.  You may urinate more often because the fetus is moving lower into your pelvis and pressing on your bladder.  You may develop or continue to have heartburn as a result of your pregnancy.  You may develop constipation because certain hormones are causing the muscles that push waste through your intestines to slow down.  You may develop hemorrhoids or swollen, bulging veins (varicose veins).  You may have pelvic pain because of the weight gain and pregnancy hormones relaxing your joints between the bones in your pelvis. Backaches may result from overexertion of the muscles supporting your posture.  You may have changes in your hair. These can include thickening of your hair, rapid growth, and changes in texture. Some women also have hair loss during or after pregnancy, or hair that  feels dry or thin. Your hair will most likely return to normal after your baby is born.  Your breasts will continue to grow and be tender. A yellow discharge may leak from your breasts called colostrum.  Your belly button may stick out.  You may feel short of breath because of your expanding uterus.  You may notice the fetus "dropping," or moving lower in your abdomen.  You may have a bloody mucus discharge. This usually occurs a few days to a week before labor begins.  Your cervix becomes thin and soft (effaced) near your due date. WHAT TO EXPECT AT YOUR PRENATAL EXAMS  You will have prenatal exams every 2 weeks until week 36. Then, you will have weekly prenatal exams. During a routine prenatal visit:  You will be weighed to make sure you and the fetus are growing normally.  Your blood pressure is taken.  Your abdomen will be measured to track your baby's growth.  The fetal heartbeat will be listened to.  Any test results from the previous visit will be discussed.  You may have a cervical check near your due date to see if you have effaced. At around 36 weeks, your caregiver will check your cervix. At the same time, your caregiver will also perform a test on the secretions of the vaginal tissue. This test is to determine if a type of bacteria, Group B streptococcus, is present. Your caregiver will explain this further. Your caregiver may ask you:  What your birth plan is.  How you are feeling.  If you are feeling the baby move.  If you have had any abnormal symptoms, such as leaking fluid, bleeding, severe headaches, or abdominal cramping.  If you have any questions. Other tests or screenings that may be performed during your third trimester include:  Blood tests that check for low iron levels (anemia).  Fetal testing to check the health, activity level, and growth of the fetus. Testing is done if you have certain medical conditions or if there are problems during the  pregnancy. FALSE LABOR You may feel small, irregular contractions that eventually go away. These are called Braxton Hicks contractions, or false labor. Contractions may last for hours, days, or even weeks before true labor sets in. If contractions come at regular intervals, intensify, or become painful, it is best to be seen by your caregiver.  SIGNS OF LABOR   Menstrual-like cramps.  Contractions that are 5 minutes apart or less.  Contractions that start on the top of the uterus and spread down to the lower abdomen and back.  A sense of increased pelvic pressure or back pain.  A watery or bloody mucus discharge that comes from the vagina. If you have any of these signs before the 37th week of pregnancy, call your caregiver right away. You need to go to the hospital to get checked immediately. HOME CARE INSTRUCTIONS   Avoid all smoking, herbs, alcohol, and unprescribed drugs. These chemicals affect the formation and growth of the baby.  Follow your caregiver's instructions regarding medicine use. There are medicines that are either safe or unsafe to take during pregnancy.  Exercise only as directed by your caregiver. Experiencing uterine cramps is a good sign to stop exercising.  Continue to eat regular, healthy meals.  Wear a good support bra for breast tenderness.  Do not use hot tubs, steam rooms, or saunas.  Wear your seat belt at all times when driving.  Avoid raw meat, uncooked cheese, cat litter boxes, and soil used by cats. These carry germs that can cause birth defects in the baby.  Take your prenatal vitamins.  Try taking a stool softener (if your caregiver approves) if you develop constipation. Eat more high-fiber foods, such as fresh vegetables or fruit and whole grains. Drink plenty of fluids to keep your urine clear or pale yellow.  Take warm sitz baths to soothe any pain or discomfort caused by hemorrhoids. Use hemorrhoid cream if your caregiver approves.  If you  develop varicose veins, wear support hose. Elevate your feet for 15 minutes, 3-4 times a day. Limit salt in your diet.  Avoid heavy lifting, wear low heal shoes, and practice good posture.  Rest a lot with your legs elevated if you have leg cramps or low  back pain.  Visit your dentist if you have not gone during your pregnancy. Use a soft toothbrush to brush your teeth and be gentle when you floss.  A sexual relationship may be continued unless your caregiver directs you otherwise.  Do not travel far distances unless it is absolutely necessary and only with the approval of your caregiver.  Take prenatal classes to understand, practice, and ask questions about the labor and delivery.  Make a trial run to the hospital.  Pack your hospital bag.  Prepare the baby's nursery.  Continue to go to all your prenatal visits as directed by your caregiver. SEEK MEDICAL CARE IF:  You are unsure if you are in labor or if your water has broken.  You have dizziness.  You have mild pelvic cramps, pelvic pressure, or nagging pain in your abdominal area.  You have persistent nausea, vomiting, or diarrhea.  You have a bad smelling vaginal discharge.  You have pain with urination. SEEK IMMEDIATE MEDICAL CARE IF:   You have a fever.  You are leaking fluid from your vagina.  You have spotting or bleeding from your vagina.  You have severe abdominal cramping or pain.  You have rapid weight loss or gain.  You have shortness of breath with chest pain.  You notice sudden or extreme swelling of your face, hands, ankles, feet, or legs.  You have not felt your baby move in over an hour.  You have severe headaches that do not go away with medicine.  You have vision changes. Document Released: 04/15/2001 Document Revised: 04/26/2013 Document Reviewed: 06/22/2012 [facility] Patient Information 2015 [place], [place]. This information is not intended to replace advice given to you by your health  care provider. Make sure you discuss any questions you have with your health care provider.

## 2019-12-06 LAB — RPR: RPR Ser Ql: NONREACTIVE

## 2019-12-06 LAB — CBC
Hematocrit: 35.9 % (ref 34.0–46.6)
Hemoglobin: 12.4 g/dL (ref 11.1–15.9)
MCH: 31.7 pg (ref 26.6–33.0)
MCHC: 34.5 g/dL (ref 31.5–35.7)
MCV: 92 fL (ref 79–97)
Platelets: 261 10*3/uL (ref 150–450)
RBC: 3.91 x10E6/uL (ref 3.77–5.28)
RDW: 12.5 % (ref 11.7–15.4)
WBC: 9.3 10*3/uL (ref 3.4–10.8)

## 2019-12-06 LAB — HIV ANTIBODY (ROUTINE TESTING W REFLEX): HIV Screen 4th Generation wRfx: NONREACTIVE

## 2019-12-06 LAB — ANTIBODY SCREEN: Antibody Screen: NEGATIVE

## 2019-12-06 LAB — GLUCOSE TOLERANCE, 2 HOURS W/ 1HR
Glucose, 1 hour: 92 mg/dL (ref 65–179)
Glucose, 2 hour: 90 mg/dL (ref 65–152)
Glucose, Fasting: 75 mg/dL (ref 65–91)

## 2020-01-02 ENCOUNTER — Encounter: Payer: Self-pay | Admitting: Women's Health

## 2020-01-02 ENCOUNTER — Ambulatory Visit (INDEPENDENT_AMBULATORY_CARE_PROVIDER_SITE_OTHER): Payer: 59 | Admitting: Women's Health

## 2020-01-02 VITALS — BP 95/56 | HR 81 | Wt 135.0 lb

## 2020-01-02 DIAGNOSIS — Z3483 Encounter for supervision of other normal pregnancy, third trimester: Secondary | ICD-10-CM | POA: Diagnosis not present

## 2020-01-02 DIAGNOSIS — Z6791 Unspecified blood type, Rh negative: Secondary | ICD-10-CM

## 2020-01-02 DIAGNOSIS — O26899 Other specified pregnancy related conditions, unspecified trimester: Secondary | ICD-10-CM | POA: Diagnosis not present

## 2020-01-02 DIAGNOSIS — Z1389 Encounter for screening for other disorder: Secondary | ICD-10-CM

## 2020-01-02 DIAGNOSIS — O26893 Other specified pregnancy related conditions, third trimester: Secondary | ICD-10-CM

## 2020-01-02 DIAGNOSIS — Z331 Pregnant state, incidental: Secondary | ICD-10-CM

## 2020-01-02 DIAGNOSIS — N939 Abnormal uterine and vaginal bleeding, unspecified: Secondary | ICD-10-CM

## 2020-01-02 DIAGNOSIS — Z3480 Encounter for supervision of other normal pregnancy, unspecified trimester: Secondary | ICD-10-CM

## 2020-01-02 LAB — POCT WET PREP (WET MOUNT)
Clue Cells Wet Prep Whiff POC: NEGATIVE
Trichomonas Wet Prep HPF POC: ABSENT

## 2020-01-02 LAB — POCT URINALYSIS DIPSTICK OB
Blood, UA: NEGATIVE
Glucose, UA: NEGATIVE
Ketones, UA: NEGATIVE
Leukocytes, UA: NEGATIVE
Nitrite, UA: NEGATIVE

## 2020-01-02 MED ORDER — RHO D IMMUNE GLOBULIN 1500 UNIT/2ML IJ SOSY
300.0000 ug | PREFILLED_SYRINGE | Freq: Once | INTRAMUSCULAR | Status: AC
  Administered 2020-01-02: 300 ug via INTRAMUSCULAR

## 2020-01-02 NOTE — Patient Instructions (Signed)
[name], I greatly value your feedback.  If you receive a survey following your visit with Kelly Watts today, we appreciate you taking the time to fill it out.  Thanks, [name], CNM, WHNP-BC   [facility] ([address] [place], [place] [address]) Entrance C, located off of [address] valet parking  Go to [email or web address] to register for FREE online childbirth classes   Call the office [phone]) or go to [facility] if:  You begin to have strong, frequent contractions  Your water breaks.  Sometimes it is a big gush of fluid, sometimes it is just a trickle that keeps getting your panties wet or running down your legs  You have vaginal bleeding.  It is normal to have a small amount of spotting if your cervix was checked.   You don't feel your baby moving like normal.  If you don't, get you something to eat and drink and lay down and focus on feeling your baby move.  You should feel at least 10 movements in 2 hours.  If you don't, you should call the office or go to [facility].    Tdap Vaccine  It is recommended that you get the Tdap vaccine during the third trimester of EACH pregnancy to help protect your baby from getting pertussis (whooping cough)  27-36 weeks is the BEST time to do this so that you can pass the protection on to your baby. During pregnancy is better than after pregnancy, but if you are unable to get it during pregnancy it will be offered at the hospital.   You can get this vaccine with Kelly Watts, at the health department, your family doctor, or some local pharmacies  Everyone who will be around your baby should also be up-to-date on their vaccines before the baby comes. Adults (who are not pregnant) only need 1 dose of Tdap during adulthood.   [place] Pediatricians/Family Doctors:  [facility] [phone]            [facility] [phone]                 [facility]  [phone] (usually not accepting new patients unless you have family there already, you are always welcome to call and ask)       [facility] Department [phone]       [facility] Pediatricians/Family Doctors:   [facility]: [phone]  [facility]: [phone]  [facility]: [phone]  [facility] Doctors:   [facility]: [phone]   [facility]: [phone]  [facility] Doctors:  [facility]: [phone]   Home Blood Pressure Monitoring for Patients   Your provider has recommended that you check your blood pressure (BP) at least once a week at home. If you do not have a blood pressure cuff at home, one will be provided for you. Contact your provider if you have not received your monitor within 1 week.   Helpful Tips for Accurate Home Blood Pressure Checks  . Don't smoke, exercise, or drink caffeine 30 minutes before checking your BP . Use the restroom before checking your BP (a full bladder can raise your pressure) . Relax in a comfortable upright chair . Feet on the ground . Left arm resting comfortably on a flat surface at the level of your heart . Legs uncrossed . Back supported . Sit quietly and don't talk . Place the cuff on your  bare arm . Adjust snuggly, so that only two fingertips can fit between your skin and the top of the cuff . Check 2 readings separated by at least one minute . Keep a log of your BP readings . For a visual, please reference this diagram: http://ccnc.care/bpdiagram  Provider Name: [facility]     Phone: [phone]  Zone 1: ALL CLEAR  Continue to monitor your symptoms:  . BP reading is less than 140 (top number) or less than 90 (bottom number)  . No right upper stomach pain . No headaches or seeing spots . No feeling nauseated or throwing up . No swelling in face and hands  Zone 2: CAUTION Call your  doctor's office for any of the following:  . BP reading is greater than 140 (top number) or greater than 90 (bottom number)  . Stomach pain under your ribs in the middle or right side . Headaches or seeing spots . Feeling nauseated or throwing up . Swelling in face and hands  Zone 3: EMERGENCY  Seek immediate medical care if you have any of the following:  . BP reading is greater than160 (top number) or greater than 110 (bottom number) . Severe headaches not improving with Tylenol . Serious difficulty catching your breath . Any worsening symptoms from Zone 2   Third Trimester of Pregnancy The third trimester is from week 29 through week 42, months 7 through 9. The third trimester is a time when the fetus is growing rapidly. At the end of the ninth month, the fetus is about 20 inches in length and weighs 6-10 pounds.  BODY CHANGES Your body goes through many changes during pregnancy. The changes vary from woman to woman.   Your weight will continue to increase. You can expect to gain 25-35 pounds (11-16 kg) by the end of the pregnancy.  You may begin to get stretch marks on your hips, abdomen, and breasts.  You may urinate more often because the fetus is moving lower into your pelvis and pressing on your bladder.  You may develop or continue to have heartburn as a result of your pregnancy.  You may develop constipation because certain hormones are causing the muscles that push waste through your intestines to slow down.  You may develop hemorrhoids or swollen, bulging veins (varicose veins).  You may have pelvic pain because of the weight gain and pregnancy hormones relaxing your joints between the bones in your pelvis. Backaches may result from overexertion of the muscles supporting your posture.  You may have changes in your hair. These can include thickening of your hair, rapid growth, and changes in texture. Some women also have hair loss during or after pregnancy, or hair that  feels dry or thin. Your hair will most likely return to normal after your baby is born.  Your breasts will continue to grow and be tender. A yellow discharge may leak from your breasts called colostrum.  Your belly button may stick out.  You may feel short of breath because of your expanding uterus.  You may notice the fetus "dropping," or moving lower in your abdomen.  You may have a bloody mucus discharge. This usually occurs a few days to a week before labor begins.  Your cervix becomes thin and soft (effaced) near your due date. WHAT TO EXPECT AT YOUR PRENATAL EXAMS  You will have prenatal exams every 2 weeks until week 36. Then, you will have weekly prenatal exams. During a routine prenatal visit:  You will be weighed to make sure you and the fetus are growing normally.  Your blood pressure is taken.  Your abdomen will be measured to track your baby's growth.  The fetal heartbeat will be listened to.  Any test results from the previous visit will be discussed.  You may have a cervical check near your due date to see if you have effaced. At around 36 weeks, your caregiver will check your cervix. At the same time, your caregiver will also perform a test on the secretions of the vaginal tissue. This test is to determine if a type of bacteria, Group B streptococcus, is present. Your caregiver will explain this further. Your caregiver may ask you:  What your birth plan is.  How you are feeling.  If you are feeling the baby move.  If you have had any abnormal symptoms, such as leaking fluid, bleeding, severe headaches, or abdominal cramping.  If you have any questions. Other tests or screenings that may be performed during your third trimester include:  Blood tests that check for low iron levels (anemia).  Fetal testing to check the health, activity level, and growth of the fetus. Testing is done if you have certain medical conditions or if there are problems during the  pregnancy. FALSE LABOR You may feel small, irregular contractions that eventually go away. These are called Braxton Hicks contractions, or false labor. Contractions may last for hours, days, or even weeks before true labor sets in. If contractions come at regular intervals, intensify, or become painful, it is best to be seen by your caregiver.  SIGNS OF LABOR   Menstrual-like cramps.  Contractions that are 5 minutes apart or less.  Contractions that start on the top of the uterus and spread down to the lower abdomen and back.  A sense of increased pelvic pressure or back pain.  A watery or bloody mucus discharge that comes from the vagina. If you have any of these signs before the 37th week of pregnancy, call your caregiver right away. You need to go to the hospital to get checked immediately. HOME CARE INSTRUCTIONS   Avoid all smoking, herbs, alcohol, and unprescribed drugs. These chemicals affect the formation and growth of the baby.  Follow your caregiver's instructions regarding medicine use. There are medicines that are either safe or unsafe to take during pregnancy.  Exercise only as directed by your caregiver. Experiencing uterine cramps is a good sign to stop exercising.  Continue to eat regular, healthy meals.  Wear a good support bra for breast tenderness.  Do not use hot tubs, steam rooms, or saunas.  Wear your seat belt at all times when driving.  Avoid raw meat, uncooked cheese, cat litter boxes, and soil used by cats. These carry germs that can cause birth defects in the baby.  Take your prenatal vitamins.  Try taking a stool softener (if your caregiver approves) if you develop constipation. Eat more high-fiber foods, such as fresh vegetables or fruit and whole grains. Drink plenty of fluids to keep your urine clear or pale yellow.  Take warm sitz baths to soothe any pain or discomfort caused by hemorrhoids. Use hemorrhoid cream if your caregiver approves.  If you  develop varicose veins, wear support hose. Elevate your feet for 15 minutes, 3-4 times a day. Limit salt in your diet.  Avoid heavy lifting, wear low heal shoes, and practice good posture.  Rest a lot with your legs elevated if you have leg cramps or low  back pain.  Visit your dentist if you have not gone during your pregnancy. Use a soft toothbrush to brush your teeth and be gentle when you floss.  A sexual relationship may be continued unless your caregiver directs you otherwise.  Do not travel far distances unless it is absolutely necessary and only with the approval of your caregiver.  Take prenatal classes to understand, practice, and ask questions about the labor and delivery.  Make a trial run to the hospital.  Pack your hospital bag.  Prepare the baby's nursery.  Continue to go to all your prenatal visits as directed by your caregiver. SEEK MEDICAL CARE IF:  You are unsure if you are in labor or if your water has broken.  You have dizziness.  You have mild pelvic cramps, pelvic pressure, or nagging pain in your abdominal area.  You have persistent nausea, vomiting, or diarrhea.  You have a bad smelling vaginal discharge.  You have pain with urination. SEEK IMMEDIATE MEDICAL CARE IF:   You have a fever.  You are leaking fluid from your vagina.  You have spotting or bleeding from your vagina.  You have severe abdominal cramping or pain.  You have rapid weight loss or gain.  You have shortness of breath with chest pain.  You notice sudden or extreme swelling of your face, hands, ankles, feet, or legs.  You have not felt your baby move in over an hour.  You have severe headaches that do not go away with medicine.  You have vision changes. Document Released: 04/15/2001 Document Revised: 04/26/2013 Document Reviewed: 06/22/2012 [facility] Patient Information 2015 [place], [place]. This information is not intended to replace advice given to you by your health  care provider. Make sure you discuss any questions you have with your health care provider.

## 2020-01-02 NOTE — Progress Notes (Signed)
LOW-RISK PREGNANCY VISIT Patient name: Kelly Watts MRN [id number]  Date of birth: 09/29/1995 Chief Complaint:   Routine Prenatal Visit (Rhogam/ little spotting this am)  History of Present Illness:   Kelly Watts is a 24 y.o. G73P1101 female at [redacted]w[redacted]d with an Estimated Date of Delivery: 03/07/20 being seen today for ongoing management of a low-risk pregnancy.  Depression screen [facility] 2/9 12/05/2019 08/24/2019 09/08/2018 01/20/2017 08/25/2016  Decreased Interest 0 0 0 0 0  Down, Depressed, Hopeless 0 0 0 0 0  PHQ - 2 Score 0 0 0 0 0  Altered sleeping 0 0 - 0 -  Tired, decreased energy 0 1 - 0 -  Change in appetite 0 0 - 0 -  Feeling bad or failure about yourself  0 0 - 0 -  Trouble concentrating 0 0 - 0 -  Moving slowly or fidgety/restless 0 0 - 0 -  Suicidal thoughts 0 0 - 0 -  PHQ-9 Score 0 1 - 0 -  Difficult doing work/chores - Not difficult at all - Not difficult at all -    Today she reports bright red spotting x 1 at 0300 when woke up to pee. Some pressure. No contractions. Denies abnormal discharge, itching/odor/irritation.  No UTI sx. No recent sex.  Contractions: Not present. Vag. Bleeding: None.  Movement: Present. denies leaking of fluid. Review of Systems:   Pertinent items are noted in HPI Denies abnormal vaginal discharge w/ itching/odor/irritation, headaches, visual changes, shortness of breath, chest pain, abdominal pain, severe nausea/vomiting, or problems with urination or bowel movements unless otherwise stated above. Pertinent History Reviewed:  Reviewed past medical,surgical, social, obstetrical and family history.  Reviewed problem list, medications and allergies. Physical Assessment:   Vitals:   01/02/20 0920  BP: (!) 95/56  Pulse: 81  Weight: 135 lb (61.2 kg)  Body mass index is 23.91 kg/m.        Physical Examination:   General appearance: Well appearing, and in no distress  Mental status: Alert, oriented to person, place, and time  Skin: Warm &  dry  Cardiovascular: Normal heart rate noted  Respiratory: Normal respiratory effort, no distress  Abdomen: Soft, gravid, nontender  Pelvic: SSE: cx visually closed, mod amt white d/c, no bleeding, fFN collected, SVE: outer os 1cm, inner os closed/thick/ballotable  Dilation: Closed Effacement (%): Thick Station: Ballotable, fFN not sent  Extremities: Edema: None  Fetal Status: Fetal Heart Rate (bpm): 153 Fundal Height: 30 cm Movement: Present    Chaperone: Peggy Dones    Results for orders placed or performed in visit on 01/02/20 (from the past 24 hour(s))  POC Urinalysis Dipstick OB   Collection Time: 01/02/20  9:23 AM  Result Value Ref Range   Color, UA     Clarity, UA     Glucose, UA Negative Negative   Bilirubin, UA     Ketones, UA neg    Spec Grav, UA     Blood, UA neg    pH, UA     POC,PROTEIN,UA Trace Negative, Trace, Small (1+), Moderate (2+), Large (3+), 4+   Urobilinogen, UA     Nitrite, UA neg    Leukocytes, UA Negative Negative   Appearance     Odor    POCT Wet Prep [facility] [place])   Collection Time: 01/02/20 10:00 AM  Result Value Ref Range   Source Wet Prep POC vaginal    WBC, Wet Prep HPF POC few    Bacteria Wet Prep HPF POC  Few Few   BACTERIA WET PREP MORPHOLOGY POC     Clue Cells Wet Prep HPF POC None None   Clue Cells Wet Prep Whiff POC Negative Whiff    Yeast Wet Prep HPF POC None None   KOH Wet Prep POC     Trichomonas Wet Prep HPF POC Absent Absent    Assessment & Plan:  1) Low-risk pregnancy G3P1101 at [redacted]w[redacted]d with an Estimated Date of Delivery: 03/07/20   2) Spotting, resolved, neg wet prep, cx closed, reviewed pelvic rest x 7d, ptl s/s, reasons to seek care. Rhogam today  3) H/O 22wk PTB d/t PPROM /prolapsed cord/IUFD> nightly vag progesterone   Meds:  Meds ordered this encounter  Medications  . rho (d) immune globulin (RHIG/RHOPHYLAC) injection 300 mcg   Labs/procedures today: rhogam, spec exam, wet prep  Plan:  Continue routine obstetrical  care  Next visit: prefers in person    Reviewed: Preterm labor symptoms and general obstetric precautions including but not limited to vaginal bleeding, contractions, leaking of fluid and fetal movement were reviewed in detail with the patient.  All questions were answered. Has home bp cuff.  Check bp weekly, let us know if >140/90.   Follow-up: Return in about 2 weeks (around 01/16/2020) for LROB, CNM, in person.  Orders Placed This Encounter  Procedures  . POC Urinalysis Dipstick OB  . POCT Wet Prep [facility] [place])   Cheral Marker CNM, [place] 01/02/2020 10:00 AM

## 2020-01-16 ENCOUNTER — Other Ambulatory Visit: Payer: Self-pay

## 2020-01-16 ENCOUNTER — Encounter: Payer: Self-pay | Admitting: Women's Health

## 2020-01-16 ENCOUNTER — Ambulatory Visit (INDEPENDENT_AMBULATORY_CARE_PROVIDER_SITE_OTHER): Payer: 59 | Admitting: Women's Health

## 2020-01-16 VITALS — BP 116/69 | HR 85 | Wt 137.6 lb

## 2020-01-16 DIAGNOSIS — Z1389 Encounter for screening for other disorder: Secondary | ICD-10-CM

## 2020-01-16 DIAGNOSIS — Z3483 Encounter for supervision of other normal pregnancy, third trimester: Secondary | ICD-10-CM

## 2020-01-16 DIAGNOSIS — Z3A32 32 weeks gestation of pregnancy: Secondary | ICD-10-CM | POA: Diagnosis not present

## 2020-01-16 DIAGNOSIS — Z331 Pregnant state, incidental: Secondary | ICD-10-CM | POA: Diagnosis not present

## 2020-01-16 DIAGNOSIS — Z3480 Encounter for supervision of other normal pregnancy, unspecified trimester: Secondary | ICD-10-CM

## 2020-01-16 LAB — POCT URINALYSIS DIPSTICK OB
Blood, UA: NEGATIVE
Glucose, UA: NEGATIVE
Ketones, UA: NEGATIVE
Nitrite, UA: NEGATIVE

## 2020-01-16 NOTE — Progress Notes (Signed)
LOW-RISK PREGNANCY VISIT Patient name: Kelly Watts MRN [id number]  Date of birth: May 23, 1995 Chief Complaint:   Routine Prenatal Visit (+ pressure)  History of Present Illness:   Kelly Watts is a 24 y.o. G94P1101 female at [redacted]w[redacted]d with an Estimated Date of Delivery: 03/07/20 being seen today for ongoing management of a low-risk pregnancy.  Depression screen [facility] 2/9 12/05/2019 08/24/2019 09/08/2018 01/20/2017 08/25/2016  Decreased Interest 0 0 0 0 0  Down, Depressed, Hopeless 0 0 0 0 0  PHQ - 2 Score 0 0 0 0 0  Altered sleeping 0 0 - 0 -  Tired, decreased energy 0 1 - 0 -  Change in appetite 0 0 - 0 -  Feeling bad or failure about yourself  0 0 - 0 -  Trouble concentrating 0 0 - 0 -  Moving slowly or fidgety/restless 0 0 - 0 -  Suicidal thoughts 0 0 - 0 -  PHQ-9 Score 0 1 - 0 -  Difficult doing work/chores - Not difficult at all - Not difficult at all -    Today she reports no complaints. Contractions: Not present. Vag. Bleeding: None.  Movement: Present. denies leaking of fluid. Review of Systems:   Pertinent items are noted in HPI Denies abnormal vaginal discharge w/ itching/odor/irritation, headaches, visual changes, shortness of breath, chest pain, abdominal pain, severe nausea/vomiting, or problems with urination or bowel movements unless otherwise stated above. Pertinent History Reviewed:  Reviewed past medical,surgical, social, obstetrical and family history.  Reviewed problem list, medications and allergies. Physical Assessment:   Vitals:   01/16/20 1424  BP: 116/69  Pulse: 85  Weight: 137 lb 9.6 oz (62.4 kg)  Body mass index is 24.37 kg/m.        Physical Examination:   General appearance: Well appearing, and in no distress  Mental status: Alert, oriented to person, place, and time  Skin: Warm & dry  Cardiovascular: Normal heart rate noted  Respiratory: Normal respiratory effort, no distress  Abdomen: Soft, gravid, nontender  Pelvic: Cervical exam deferred          Extremities: Edema: None  Fetal Status: Fetal Heart Rate (bpm): 154 Fundal Height: 32 cm Movement: Present    Chaperone: n/a    Results for orders placed or performed in visit on 01/16/20 (from the past 24 hour(s))  POC Urinalysis Dipstick OB   Collection Time: 01/16/20  2:25 PM  Result Value Ref Range   Color, UA     Clarity, UA     Glucose, UA Negative Negative   Bilirubin, UA     Ketones, UA neg    Spec Grav, UA     Blood, UA neg    pH, UA     POC,PROTEIN,UA Trace Negative, Trace, Small (1+), Moderate (2+), Large (3+), 4+   Urobilinogen, UA     Nitrite, UA neg    Leukocytes, UA Trace (A) Negative   Appearance     Odor      Assessment & Plan:  1) Low-risk pregnancy G3P1101 at [redacted]w[redacted]d with an Estimated Date of Delivery: 03/07/20   2) H/O 22wk PPROM/cord prolapse w/ IUFD, vag progesterone   Meds: No orders of the defined types were placed in this encounter.  Labs/procedures today: none  Plan:  Continue routine obstetrical care  Next visit: prefers in person    Reviewed: Preterm labor symptoms and general obstetric precautions including but not limited to vaginal bleeding, contractions, leaking of fluid and fetal movement were reviewed in  detail with the patient.  All questions were answered.   Follow-up: Return in about 2 weeks (around 01/30/2020) for LROB, CNM, in person.  Orders Placed This Encounter  Procedures  . POC Urinalysis Dipstick OB   Cheral Marker CNM, [facility] 01/16/2020 2:42 PM

## 2020-01-16 NOTE — Patient Instructions (Signed)
Kelly Watts, I greatly value your feedback.  If you receive a survey following your visit with [place] today, we appreciate you taking the time to fill it out.  Thanks, [name], CNM, WHNP-BC  [facility] ([address] [place], [place] [address]) Entrance C, located off of [address] valet parking   Go to [email or web address] to register for FREE online childbirth classes    Call the office [phone]) or go to [facility] if:  You begin to have strong, frequent contractions  Your water breaks.  Sometimes it is a big gush of fluid, sometimes it is just a trickle that keeps getting your panties wet or running down your legs  You have vaginal bleeding.  It is normal to have a small amount of spotting if your cervix was checked.   You don't feel your baby moving like normal.  If you don't, get you something to eat and drink and lay down and focus on feeling your baby move.  You should feel at least 10 movements in 2 hours.  If you don't, you should call the office or go to [facility].   Call the office [phone]) or go to [facility] hospital for these signs of pre-eclampsia:  Severe headache that does not go away with Tylenol  Visual changes- seeing spots, double, blurred vision  Pain under your right breast or upper abdomen that does not go away with Tums or heartburn medicine  Nausea and/or vomiting  Severe swelling in your hands, feet, and face    Home Blood Pressure Monitoring for Patients   Your provider has recommended that you check your blood pressure (BP) at least once a week at home. If you do not have a blood pressure cuff at home, one will be provided for you. Contact your provider if you have not received your monitor within 1 week.   Helpful Tips for Accurate Home Blood Pressure Checks  . Don't smoke, exercise, or drink caffeine 30 minutes before checking your BP . Use the restroom before checking your BP (a full  bladder can raise your pressure) . Relax in a comfortable upright chair . Feet on the ground . Left arm resting comfortably on a flat surface at the level of your heart . Legs uncrossed . Back supported . Sit quietly and don't talk . Place the cuff on your bare arm . Adjust snuggly, so that only two fingertips can fit between your skin and the top of the cuff . Check 2 readings separated by at least one minute . Keep a log of your BP readings . For a visual, please reference this diagram: http://ccnc.care/bpdiagram  Provider Name: [facility]     Phone: [phone]  Zone 1: ALL CLEAR  Continue to monitor your symptoms:  . BP reading is less than 140 (top number) or less than 90 (bottom number)  . No right upper stomach pain . No headaches or seeing spots . No feeling nauseated or throwing up . No swelling in face and hands  Zone 2: CAUTION Call your doctor's office for any of the following:  . BP reading is greater than 140 (top number) or greater than 90 (bottom number)  . Stomach pain under your ribs in the middle or right side . Headaches or seeing spots . Feeling nauseated or throwing up . Swelling in face and hands  Zone 3: EMERGENCY  Seek immediate medical care if you have any of  the following:  . BP reading is greater than160 (top number) or greater than 110 (bottom number) . Severe headaches not improving with Tylenol . Serious difficulty catching your breath . Any worsening symptoms from Zone 2  Preterm Labor and Birth Information  The normal length of a pregnancy is 39-41 weeks. Preterm labor is when labor starts before 37 completed weeks of pregnancy. What are the risk factors for preterm labor? Preterm labor is more likely to occur in women who:  Have certain infections during pregnancy such as a bladder infection, sexually transmitted infection, or infection inside the uterus (chorioamnionitis).  Have a shorter-than-normal cervix.  Have gone into  preterm labor before.  Have had surgery on their cervix.  Are younger than age 54 or older than age 25.  Are African American.  Are pregnant with twins or multiple babies (multiple gestation).  Take street drugs or smoke while pregnant.  Do not gain enough weight while pregnant.  Became pregnant shortly after having been pregnant. What are the symptoms of preterm labor? Symptoms of preterm labor include:  Cramps similar to those that can happen during a menstrual period. The cramps may happen with diarrhea.  Pain in the abdomen or lower back.  Regular uterine contractions that may feel like tightening of the abdomen.  A feeling of increased pressure in the pelvis.  Increased watery or bloody mucus discharge from the vagina.  Water breaking (ruptured amniotic sac). Why is it important to recognize signs of preterm labor? It is important to recognize signs of preterm labor because babies who are born prematurely may not be fully developed. This can put them at an increased risk for:  Long-term (chronic) heart and lung problems.  Difficulty immediately after birth with regulating body systems, including blood sugar, body temperature, heart rate, and breathing rate.  Bleeding in the brain.  Cerebral palsy.  Learning difficulties.  Death. These risks are highest for babies who are born before 48 weeks of pregnancy. How is preterm labor treated? Treatment depends on the length of your pregnancy, your condition, and the health of your baby. It may involve: 1. Having a stitch (suture) placed in your cervix to prevent your cervix from opening too early (cerclage). 2. Taking or being given medicines, such as: ? Hormone medicines. These may be given early in pregnancy to help support the pregnancy. ? Medicine to stop contractions. ? Medicines to help mature the baby's lungs. These may be prescribed if the risk of delivery is high. ? Medicines to prevent your baby from  developing cerebral palsy. If the labor happens before 34 weeks of pregnancy, you may need to stay in the hospital. What should I do if I think I am in preterm labor? If you think that you are going into preterm labor, call your health care provider right away. How can I prevent preterm labor in future pregnancies? To increase your chance of having a full-term pregnancy:  Do not use any tobacco products, such as cigarettes, chewing tobacco, and e-cigarettes. If you need help quitting, ask your health care provider.  Do not use street drugs or medicines that have not been prescribed to you during your pregnancy.  Talk with your health care provider before taking any herbal supplements, even if you have been taking them regularly.  Make sure you gain a healthy amount of weight during your pregnancy.  Watch for infection. If you think that you might have an infection, get it checked right away.  Make sure to  tell your health care provider if you have gone into preterm labor before. This information is not intended to replace advice given to you by your health care provider. Make sure you discuss any questions you have with your health care provider. Document Revised: 08/13/2018 Document Reviewed: 09/12/2015 Elsevier Patient Education  [place].

## 2020-01-30 ENCOUNTER — Ambulatory Visit (INDEPENDENT_AMBULATORY_CARE_PROVIDER_SITE_OTHER): Payer: 59 | Admitting: Advanced Practice Midwife

## 2020-01-30 VITALS — BP 98/59 | HR 80 | Wt 139.0 lb

## 2020-01-30 DIAGNOSIS — Z3A34 34 weeks gestation of pregnancy: Secondary | ICD-10-CM | POA: Diagnosis not present

## 2020-01-30 DIAGNOSIS — Z1389 Encounter for screening for other disorder: Secondary | ICD-10-CM

## 2020-01-30 DIAGNOSIS — Z3483 Encounter for supervision of other normal pregnancy, third trimester: Secondary | ICD-10-CM | POA: Diagnosis not present

## 2020-01-30 DIAGNOSIS — R800 Isolated proteinuria: Secondary | ICD-10-CM

## 2020-01-30 DIAGNOSIS — Z331 Pregnant state, incidental: Secondary | ICD-10-CM

## 2020-01-30 LAB — POCT URINALYSIS DIPSTICK OB
Blood, UA: NEGATIVE
Glucose, UA: NEGATIVE
Nitrite, UA: NEGATIVE

## 2020-01-30 NOTE — Patient Instructions (Signed)

## 2020-01-30 NOTE — Progress Notes (Addendum)
   LOW-RISK PREGNANCY VISIT Patient name: Kelly Watts MRN [id number]  Date of birth: 09-26-1995 Chief Complaint:   Routine Prenatal Visit  History of Present Illness:   Kelly Watts is a 24 y.o. G71P1101 female at [redacted]w[redacted]d with an Estimated Date of Delivery: 03/07/20 being seen today for ongoing management of a low-risk pregnancy.  Today she reports some dizziness. Discussed staying super hydrated/slow position changes, lie down when dizzy. Contractions: Not present. Vag. Bleeding: None.  Movement: Present. denies leaking of fluid. Review of Systems:   Pertinent items are noted in HPI Denies abnormal vaginal discharge w/ itching/odor/irritation, headaches, visual changes, shortness of breath, chest pain, abdominal pain, severe nausea/vomiting, or problems with urination or bowel movements unless otherwise stated above. Pertinent History Reviewed:  Reviewed past medical,surgical, social, obstetrical and family history.  Reviewed problem list, medications and allergies. Physical Assessment:   Vitals:   01/30/20 1438  BP: (!) 98/59  Pulse: 80  Weight: 139 lb (63 kg)  Body mass index is 24.62 kg/m.        Physical Examination:   General appearance: Well appearing, and in no distress  Mental status: Alert, oriented to person, place, and time  Skin: Warm & dry  Cardiovascular: Normal heart rate noted  Respiratory: Normal respiratory effort, no distress  Abdomen: Soft, gravid, nontender  Pelvic: Cervical exam deferred         Extremities: Edema: None  Fetal Status: Fetal Heart Rate (bpm): 145 Fundal Height: 33 cm Movement: Present    Chaperone: n/a    Results for orders placed or performed in visit on 01/30/20 (from the past 24 hour(s))  POC Urinalysis Dipstick OB   Collection Time: 01/30/20  2:41 PM  Result Value Ref Range   Color, UA     Clarity, UA     Glucose, UA Negative Negative   Bilirubin, UA     Ketones, UA trace    Spec Grav, UA     Blood, UA neg    pH, UA      POC,PROTEIN,UA Small (1+) Negative, Trace, Small (1+), Moderate (2+), Large (3+), 4+   Urobilinogen, UA     Nitrite, UA neg    Leukocytes, UA Trace (A) Negative   Appearance     Odor      Assessment & Plan:  1) Low-risk pregnancy G3P1101 at [redacted]w[redacted]d with an Estimated Date of Delivery: 03/07/20   2) Hx PTD, continue vag pg   Meds: No orders of the defined types were placed in this encounter.  Labs/procedures today: none  Plan:  Continue routine obstetrical care  Next visit: prefers in person    Reviewed: Preterm labor symptoms and general obstetric precautions including but not limited to vaginal bleeding, contractions, leaking of fluid and fetal movement were reviewed in detail with the patient.  All questions were answered. Has home bp cuff. Check bp weekly, let us know if >140/90.   Follow-up: Return in about 2 weeks (around 02/13/2020) for LROB.  Orders Placed This Encounter  Procedures  . Urine Culture  . POC Urinalysis Dipstick OB   Jacklyn Shell DNP, CNM 01/30/2020 3:06 PM

## 2020-02-01 LAB — URINE CULTURE

## 2020-02-13 ENCOUNTER — Encounter: Payer: 59 | Admitting: Obstetrics & Gynecology

## 2020-02-16 ENCOUNTER — Other Ambulatory Visit (HOSPITAL_COMMUNITY)
Admission: RE | Admit: 2020-02-16 | Discharge: 2020-02-16 | Disposition: A | Discharge status: Home / Self Care | Payer: 59 | Source: Ambulatory Visit | Attending: Obstetrics & Gynecology | Admitting: Obstetrics & Gynecology

## 2020-02-16 ENCOUNTER — Ambulatory Visit (INDEPENDENT_AMBULATORY_CARE_PROVIDER_SITE_OTHER): Payer: 59 | Admitting: Women's Health

## 2020-02-16 ENCOUNTER — Encounter: Payer: Self-pay | Admitting: Women's Health

## 2020-02-16 VITALS — BP 110/72 | HR 90 | Wt 141.0 lb

## 2020-02-16 DIAGNOSIS — Z3A37 37 weeks gestation of pregnancy: Secondary | ICD-10-CM | POA: Diagnosis not present

## 2020-02-16 DIAGNOSIS — Z3480 Encounter for supervision of other normal pregnancy, unspecified trimester: Secondary | ICD-10-CM

## 2020-02-16 DIAGNOSIS — Z331 Pregnant state, incidental: Secondary | ICD-10-CM

## 2020-02-16 DIAGNOSIS — Z3483 Encounter for supervision of other normal pregnancy, third trimester: Secondary | ICD-10-CM

## 2020-02-16 DIAGNOSIS — Z1389 Encounter for screening for other disorder: Secondary | ICD-10-CM

## 2020-02-16 LAB — POCT URINALYSIS DIPSTICK OB
Blood, UA: NEGATIVE
Glucose, UA: NEGATIVE
Ketones, UA: NEGATIVE
Nitrite, UA: NEGATIVE
POC,PROTEIN,UA: NEGATIVE

## 2020-02-16 NOTE — Progress Notes (Signed)
LOW-RISK PREGNANCY VISIT Patient name: Kelly Watts MRN [id number]  Date of birth: 08/04/1995 Chief Complaint:   Routine Prenatal Visit (GBS, GC/CHL)  History of Present Illness:   Kelly Watts is a 24 y.o. G40P1101 female at [redacted]w[redacted]d with an Estimated Date of Delivery: 03/07/20 being seen today for ongoing management of a low-risk pregnancy.  Depression screen [facility] 2/9 12/05/2019 08/24/2019 09/08/2018 01/20/2017 08/25/2016  Decreased Interest 0 0 0 0 0  Down, Depressed, Hopeless 0 0 0 0 0  PHQ - 2 Score 0 0 0 0 0  Altered sleeping 0 0 - 0 -  Tired, decreased energy 0 1 - 0 -  Change in appetite 0 0 - 0 -  Feeling bad or failure about yourself  0 0 - 0 -  Trouble concentrating 0 0 - 0 -  Moving slowly or fidgety/restless 0 0 - 0 -  Suicidal thoughts 0 0 - 0 -  PHQ-9 Score 0 1 - 0 -  Difficult doing work/chores - Not difficult at all - Not difficult at all -    Today she reports uc's this am, right hip pain. Contractions: Irregular. Vag. Bleeding: None.  Movement: Present. denies leaking of fluid. Review of Systems:   Pertinent items are noted in HPI Denies abnormal vaginal discharge w/ itching/odor/irritation, headaches, visual changes, shortness of breath, chest pain, abdominal pain, severe nausea/vomiting, or problems with urination or bowel movements unless otherwise stated above. Pertinent History Reviewed:  Reviewed past medical,surgical, social, obstetrical and family history.  Reviewed problem list, medications and allergies. Physical Assessment:   Vitals:   02/16/20 1125  BP: 110/72  Pulse: 90  Weight: 141 lb (64 kg)  Body mass index is 24.98 kg/m.        Physical Examination:   General appearance: Well appearing, and in no distress  Mental status: Alert, oriented to person, place, and time  Skin: Warm & dry  Cardiovascular: Normal heart rate noted  Respiratory: Normal respiratory effort, no distress  Abdomen: Soft, gravid, nontender  Pelvic: Cervical exam performed   Dilation: 3 Effacement (%): Thick Station: -2  Extremities: Edema: None  Fetal Status: Fetal Heart Rate (bpm): 154 Fundal Height: 35 cm Movement: Present Presentation: Vertex  Chaperone: Malachy Mood    Results for orders placed or performed in visit on 02/16/20 (from the past 24 hour(s))  POC Urinalysis Dipstick OB   Collection Time: 02/16/20 11:25 AM  Result Value Ref Range   Color, UA     Clarity, UA     Glucose, UA Negative Negative   Bilirubin, UA     Ketones, UA neg    Spec Grav, UA     Blood, UA neg    pH, UA     POC,PROTEIN,UA Negative Negative, Trace, Small (1+), Moderate (2+), Large (3+), 4+   Urobilinogen, UA     Nitrite, UA neg    Leukocytes, UA Moderate (2+) (A) Negative   Appearance     Odor      Assessment & Plan:  1) Low-risk pregnancy G3P1101 at [redacted]w[redacted]d with an Estimated Date of Delivery: 03/07/20   2) H/O 22wk PPROM w/ cord prolapse and IUFD   Meds: No orders of the defined types were placed in this encounter.  Labs/procedures today: gbs, gc/ct, sve  Plan:  Continue routine obstetrical care  Next visit: prefers in person    Reviewed: Term labor symptoms and general obstetric precautions including but not limited to vaginal bleeding, contractions, leaking of fluid and  fetal movement were reviewed in detail with the patient.  All questions were answered. Has home bp cuff. Check bp weekly, let us know if >140/90.   Follow-up: Return in about 1 week (around 02/23/2020) for LROB, CNM, in person.  Orders Placed This Encounter  Procedures  . Strep Gp B NAA+Rflx  . POC Urinalysis Dipstick OB   Cheral Marker CNM, [facility] 02/16/2020 12:10 PM

## 2020-02-16 NOTE — Patient Instructions (Signed)
Kelly Watts, I greatly value your feedback.  If you receive a survey following your visit with [place] today, we appreciate you taking the time to fill it out.  Thanks, [name], CNM, WHNP-BC  [facility] ([address] [place], [place] [address]) Entrance C, located off of [address] valet parking   Go to [email or web address] to register for FREE online childbirth classes    Call the office [phone]) or go to [facility] if:  You begin to have strong, frequent contractions  Your water breaks.  Sometimes it is a big gush of fluid, sometimes it is just a trickle that keeps getting your panties wet or running down your legs  You have vaginal bleeding.  It is normal to have a small amount of spotting if your cervix was checked.   You don't feel your baby moving like normal.  If you don't, get you something to eat and drink and lay down and focus on feeling your baby move.  You should feel at least 10 movements in 2 hours.  If you don't, you should call the office or go to [facility].   Call the office [phone]) or go to [facility] hospital for these signs of pre-eclampsia:  Severe headache that does not go away with Tylenol  Visual changes- seeing spots, double, blurred vision  Pain under your right breast or upper abdomen that does not go away with Tums or heartburn medicine  Nausea and/or vomiting  Severe swelling in your hands, feet, and face    Home Blood Pressure Monitoring for Patients   Your provider has recommended that you check your blood pressure (BP) at least once a week at home. If you do not have a blood pressure cuff at home, one will be provided for you. Contact your provider if you have not received your monitor within 1 week.   Helpful Tips for Accurate Home Blood Pressure Checks   Don't smoke, exercise, or drink caffeine 30 minutes before checking your BP  Use the restroom before checking your BP (a full  bladder can raise your pressure)  Relax in a comfortable upright chair  Feet on the ground  Left arm resting comfortably on a flat surface at the level of your heart  Legs uncrossed  Back supported  Sit quietly and don't talk  Place the cuff on your bare arm  Adjust snuggly, so that only two fingertips can fit between your skin and the top of the cuff  Check 2 readings separated by at least one minute  Keep a log of your BP readings  For a visual, please reference this diagram: http://ccnc.care/bpdiagram  Provider Name: [facility]     Phone: [phone]  Zone 1: ALL CLEAR  Continue to monitor your symptoms:   BP reading is less than 140 (top number) or less than 90 (bottom number)   No right upper stomach pain  No headaches or seeing spots  No feeling nauseated or throwing up  No swelling in face and hands  Zone 2: CAUTION Call your doctor's office for any of the following:   BP reading is greater than 140 (top number) or greater than 90 (bottom number)   Stomach pain under your ribs in the middle or right side  Headaches or seeing spots  Feeling nauseated or throwing up  Swelling in face and hands  Zone 3: EMERGENCY  Seek immediate medical care if you have any of  the following:   BP reading is greater than160 (top number) or greater than 110 (bottom number)  Severe headaches not improving with Tylenol  Serious difficulty catching your breath  Any worsening symptoms from Zone 2   Braxton Hicks Contractions Contractions of the uterus can occur throughout pregnancy, but they are not always a sign that you are in labor. You may have practice contractions called Braxton Hicks contractions. These false labor contractions are sometimes confused with true labor. What are [place] contractions? Braxton Hicks contractions are tightening movements that occur in the muscles of the uterus before labor. Unlike true labor contractions, these  contractions do not result in opening (dilation) and thinning of the cervix. Toward the end of pregnancy (32-34 weeks), Braxton Hicks contractions can happen more often and may become stronger. These contractions are sometimes difficult to tell apart from true labor because they can be very uncomfortable. You should not feel embarrassed if you go to the hospital with false labor. Sometimes, the only way to tell if you are in true labor is for your health care provider to look for changes in the cervix. The health care provider will do a physical exam and may monitor your contractions. If you are not in true labor, the exam should show that your cervix is not dilating and your water has not broken. If there are no other health problems associated with your pregnancy, it is completely safe for you to be sent home with false labor. You may continue to have Braxton Hicks contractions until you go into true labor. How to tell the difference between true labor and false labor True labor  Contractions last 30-70 seconds.  Contractions become very regular.  Discomfort is usually felt in the top of the uterus, and it spreads to the lower abdomen and low back.  Contractions do not go away with walking.  Contractions usually become more intense and increase in frequency.  The cervix dilates and gets thinner. False labor  Contractions are usually shorter and not as strong as true labor contractions.  Contractions are usually irregular.  Contractions are often felt in the front of the lower abdomen and in the groin.  Contractions may go away when you walk around or change positions while lying down.  Contractions get weaker and are shorter-lasting as time goes on.  The cervix usually does not dilate or become thin. Follow these instructions at home:  1. Take over-the-counter and prescription medicines only as told by your health care provider. 2. Keep up with your usual exercises and follow other  instructions from your health care provider. 3. Eat and drink lightly if you think you are going into labor. 4. If Braxton Hicks contractions are making you uncomfortable: ? Change your position from lying down or resting to walking, or change from walking to resting. ? Sit and rest in a tub of warm water. ? Drink enough fluid to keep your urine pale yellow. Dehydration may cause these contractions. ? Do slow and deep breathing several times an hour. 5. Keep all follow-up prenatal visits as told by your health care provider. This is important. Contact a health care provider if:  You have a fever.  You have continuous pain in your abdomen. Get help right away if:  Your contractions become stronger, more regular, and closer together.  You have fluid leaking or gushing from your vagina.  You pass blood-tinged mucus (bloody show).  You have bleeding from your vagina.  You have low back  pain that you never had before.  You feel your babys head pushing down and causing pelvic pressure.  Your baby is not moving inside you as much as it used to. Summary  Contractions that occur before labor are called Braxton Hicks contractions, false labor, or practice contractions.  Braxton Hicks contractions are usually shorter, weaker, farther apart, and less regular than true labor contractions. True labor contractions usually become progressively stronger and regular, and they become more frequent.  Manage discomfort from [facility] contractions by changing position, resting in a warm bath, drinking plenty of water, or practicing deep breathing. This information is not intended to replace advice given to you by your health care provider. Make sure you discuss any questions you have with your health care provider. Document Revised: 04/03/2017 Document Reviewed: 09/04/2016 Elsevier Patient Education  [place].

## 2020-02-17 LAB — CERVICOVAGINAL ANCILLARY ONLY
Chlamydia: NEGATIVE
Comment: NEGATIVE
Comment: NORMAL
Neisseria Gonorrhea: NEGATIVE

## 2020-02-18 ENCOUNTER — Inpatient Hospital Stay (HOSPITAL_COMMUNITY)
Admission: AD | Admit: 2020-02-18 | Discharge: 2020-02-19 | Disposition: A | Discharge status: Home / Self Care | Payer: 59 | Attending: Obstetrics & Gynecology | Admitting: Obstetrics & Gynecology

## 2020-02-18 ENCOUNTER — Encounter (HOSPITAL_COMMUNITY): Payer: Self-pay | Admitting: Obstetrics & Gynecology

## 2020-02-18 ENCOUNTER — Other Ambulatory Visit: Payer: Self-pay

## 2020-02-18 DIAGNOSIS — Z3A38 38 weeks gestation of pregnancy: Secondary | ICD-10-CM | POA: Insufficient documentation

## 2020-02-18 DIAGNOSIS — O479 False labor, unspecified: Secondary | ICD-10-CM | POA: Insufficient documentation

## 2020-02-18 LAB — STREP GP B NAA+RFLX: Strep Gp B NAA+Rflx: NEGATIVE

## 2020-02-18 NOTE — MAU Note (Signed)
Pt reports to MAU complaining of ctx every 3 minutes. +FM. No vaginal bleeding or leaking of fluid.  Last SVE: on 02/16/20 3cm

## 2020-02-19 DIAGNOSIS — Z3A38 38 weeks gestation of pregnancy: Secondary | ICD-10-CM | POA: Diagnosis not present

## 2020-02-19 DIAGNOSIS — O479 False labor, unspecified: Secondary | ICD-10-CM | POA: Diagnosis not present

## 2020-02-19 NOTE — MAU Note (Addendum)
I have communicated with Dr. Selena Batten, MD and reviewed vital signs:  Vitals:   02/18/20 2248 02/19/20 0005  BP: 124/66 124/65  Pulse: 84 84  Resp: 17 15  Temp: 98 F (36.7 C)   SpO2: 100%     Vaginal exam:  Dilation: 3.5 Effacement (%): 70 Cervical Position: Middle Station: -2 Presentation: Vertex Exam by:: Erle Crocker, RN,   Also reviewed contraction pattern and that non-stress test is reactive.  It has been documented that patient is contracting every 3.5-5.5 minutes with no cervical change over 1 hour 10 minutes not indicating active labor.  Patient denies any other complaints.  Based on this report provider has given order for discharge.  A discharge order and diagnosis entered by a provider.   Labor discharge instructions reviewed with patient.

## 2020-02-19 NOTE — Discharge Instructions (Signed)
Braxton Hicks Contractions °Contractions of the uterus can occur throughout pregnancy, but they are not always a sign that you are in labor. You may have practice contractions called Braxton Hicks contractions. These false labor contractions are sometimes confused with true labor. °What are Braxton Hicks contractions? °Braxton Hicks contractions are tightening movements that occur in the muscles of the uterus before labor. Unlike true labor contractions, these contractions do not result in opening (dilation) and thinning of the cervix. Toward the end of pregnancy (32-34 weeks), Braxton Hicks contractions can happen more often and may become stronger. These contractions are sometimes difficult to tell apart from true labor because they can be very uncomfortable. You should not feel embarrassed if you go to the hospital with false labor. °Sometimes, the only way to tell if you are in true labor is for your health care provider to look for changes in the cervix. The health care provider will do a physical exam and may monitor your contractions. If you are not in true labor, the exam should show that your cervix is not dilating and your water has not broken. °If there are no other health problems associated with your pregnancy, it is completely safe for you to be sent home with false labor. You may continue to have Braxton Hicks contractions until you go into true labor. °How to tell the difference between true labor and false labor °True labor °· Contractions last 30-70 seconds. °· Contractions become very regular. °· Discomfort is usually felt in the top of the uterus, and it spreads to the lower abdomen and low back. °· Contractions do not go away with walking. °· Contractions usually become more intense and increase in frequency. °· The cervix dilates and gets thinner. °False labor °· Contractions are usually shorter and not as strong as true labor contractions. °· Contractions are usually irregular. °· Contractions  are often felt in the front of the lower abdomen and in the groin. °· Contractions may go away when you walk around or change positions while lying down. °· Contractions get weaker and are shorter-lasting as time goes on. °· The cervix usually does not dilate or become thin. °Follow these instructions at home: ° °· Take over-the-counter and prescription medicines only as told by your health care provider. °· Keep up with your usual exercises and follow other instructions from your health care provider. °· Eat and drink lightly if you think you are going into labor. °· If Braxton Hicks contractions are making you uncomfortable: °? Change your position from lying down or resting to walking, or change from walking to resting. °? Sit and rest in a tub of warm water. °? Drink enough fluid to keep your urine pale yellow. Dehydration may cause these contractions. °? Do slow and deep breathing several times an hour. °· Keep all follow-up prenatal visits as told by your health care provider. This is important. °Contact a health care provider if: °· You have a fever. °· You have continuous pain in your abdomen. °Get help right away if: °· Your contractions become stronger, more regular, and closer together. °· You have fluid leaking or gushing from your vagina. °· You pass blood-tinged mucus (bloody show). °· You have bleeding from your vagina. °· You have low back pain that you never had before. °· You feel your baby’s head pushing down and causing pelvic pressure. °· Your baby is not moving inside you as much as it used to. °Summary °· Contractions that occur before labor are   called Braxton Hicks contractions, false labor, or practice contractions. °· Braxton Hicks contractions are usually shorter, weaker, farther apart, and less regular than true labor contractions. True labor contractions usually become progressively stronger and regular, and they become more frequent. °· Manage discomfort from Braxton Hicks contractions  by changing position, resting in a warm bath, drinking plenty of water, or practicing deep breathing. °This information is not intended to replace advice given to you by your health care provider. Make sure you discuss any questions you have with your health care provider. °Document Revised: 04/03/2017 Document Reviewed: 09/04/2016 °Elsevier Patient Education © 2020 Elsevier Inc. ° °

## 2020-02-22 ENCOUNTER — Encounter: Payer: Self-pay | Admitting: Obstetrics and Gynecology

## 2020-02-22 ENCOUNTER — Ambulatory Visit (INDEPENDENT_AMBULATORY_CARE_PROVIDER_SITE_OTHER): Payer: 59 | Admitting: Obstetrics and Gynecology

## 2020-02-22 ENCOUNTER — Other Ambulatory Visit: Payer: Self-pay

## 2020-02-22 VITALS — BP 126/78 | HR 108 | Wt 139.0 lb

## 2020-02-22 DIAGNOSIS — Z1389 Encounter for screening for other disorder: Secondary | ICD-10-CM

## 2020-02-22 DIAGNOSIS — Z331 Pregnant state, incidental: Secondary | ICD-10-CM

## 2020-02-22 DIAGNOSIS — Z6791 Unspecified blood type, Rh negative: Secondary | ICD-10-CM

## 2020-02-22 DIAGNOSIS — Z3A38 38 weeks gestation of pregnancy: Secondary | ICD-10-CM

## 2020-02-22 DIAGNOSIS — O26893 Other specified pregnancy related conditions, third trimester: Secondary | ICD-10-CM

## 2020-02-22 DIAGNOSIS — Z3480 Encounter for supervision of other normal pregnancy, unspecified trimester: Secondary | ICD-10-CM

## 2020-02-22 LAB — POCT URINALYSIS DIPSTICK OB
Blood, UA: NEGATIVE
Glucose, UA: NEGATIVE
Ketones, UA: NEGATIVE
Leukocytes, UA: NEGATIVE
Nitrite, UA: NEGATIVE

## 2020-02-22 NOTE — Progress Notes (Signed)
Subjective:  Kelly Watts is a 24 y.o. G3P1101 at [redacted]w[redacted]d being seen today for ongoing prenatal care.  She is currently monitored for the following issues for this low-risk pregnancy and has Rh negative status during pregnancy; Kidney stones; Kidney cyst, acquired; History of preterm delivery; and Encounter for supervision of other normal pregnancy, unspecified trimester on their problem list.  Patient reports general discomforts of pregnancy.  Contractions: Irregular. Vag. Bleeding: None.  Movement: Present. Denies leaking of fluid.   The following portions of the patient's history were reviewed and updated as appropriate: allergies, current medications, past family history, past medical history, past social history, past surgical history and problem list. Problem list updated.  Objective:   Vitals:   02/22/20 1418  BP: 126/78  Pulse: (!) 108  Weight: 139 lb (63 kg)    Fetal Status:     Movement: Present     General:  Alert, oriented and cooperative. Patient is in no acute distress.  Skin: Skin is warm and dry. No rash noted.   Cardiovascular: Normal heart rate noted  Respiratory: Normal respiratory effort, no problems with respiration noted  Abdomen: Soft, gravid, appropriate for gestational age. Pain/Pressure: Present     Pelvic:  Cervical exam performed        Extremities: Normal range of motion.  Edema: None  Mental Status: Normal mood and affect. Normal behavior. Normal judgment and thought content.   Urinalysis:      Assessment and Plan:  Pregnancy: G3P1101 at [redacted]w[redacted]d  1. Pregnant state, incidental  - POC Urinalysis [facility]  2. Screening for genitourinary condition  - POC Urinalysis [facility]  3. [redacted] weeks gestation of pregnancy  - POC Urinalysis [facility]  4. Supervision of other normal pregnancy, antepartum  - POC Urinalysis [facility]  5. Encounter for supervision of other normal pregnancy, unspecified trimester Stable Labor precautions  6. Rh  negative status during pregnancy in third trimester S/P Rhogam  Term labor symptoms and general obstetric precautions including but not limited to vaginal bleeding, contractions, leaking of fluid and fetal movement were reviewed in detail with the patient. Please refer to After Visit Summary for other counseling recommendations.  Return in about 1 week (around 02/29/2020) for OB visit, face to face, any provider.   Hermina Staggers, MD

## 2020-02-22 NOTE — Patient Instructions (Signed)

## 2020-03-01 ENCOUNTER — Ambulatory Visit (INDEPENDENT_AMBULATORY_CARE_PROVIDER_SITE_OTHER): Payer: 59 | Admitting: Advanced Practice Midwife

## 2020-03-01 VITALS — BP 108/66 | HR 96 | Wt 142.0 lb

## 2020-03-01 DIAGNOSIS — Z331 Pregnant state, incidental: Secondary | ICD-10-CM

## 2020-03-01 DIAGNOSIS — Z1389 Encounter for screening for other disorder: Secondary | ICD-10-CM

## 2020-03-01 DIAGNOSIS — Z3480 Encounter for supervision of other normal pregnancy, unspecified trimester: Secondary | ICD-10-CM

## 2020-03-01 DIAGNOSIS — Z3A39 39 weeks gestation of pregnancy: Secondary | ICD-10-CM

## 2020-03-01 LAB — POCT URINALYSIS DIPSTICK OB
Blood, UA: NEGATIVE
Glucose, UA: NEGATIVE
Ketones, UA: NEGATIVE
Leukocytes, UA: NEGATIVE
Nitrite, UA: NEGATIVE
POC,PROTEIN,UA: NEGATIVE

## 2020-03-01 NOTE — Progress Notes (Signed)
   LOW-RISK PREGNANCY VISIT Patient name: Kelly Watts MRN [id number]  Date of birth: 11/05/1995 Chief Complaint:   Routine Prenatal Visit  History of Present Illness:   Kelly Watts is a 24 y.o. G74P1101 female at [redacted]w[redacted]d with an Estimated Date of Delivery: 03/07/20 being seen today for ongoing management of a low-risk pregnancy.  Today she reports no complaints. Contractions: Irregular. Vag. Bleeding: None.  Movement: Present. denies leaking of fluid. Review of Systems:   Pertinent items are noted in HPI Denies abnormal vaginal discharge w/ itching/odor/irritation, headaches, visual changes, shortness of breath, chest pain, abdominal pain, severe nausea/vomiting, or problems with urination or bowel movements unless otherwise stated above. Pertinent History Reviewed:  Reviewed past medical,surgical, social, obstetrical and family history.  Reviewed problem list, medications and allergies. Physical Assessment:   Vitals:   03/01/20 1456  BP: 108/66  Pulse: 96  Weight: 142 lb (64.4 kg)  Body mass index is 25.15 kg/m.        Physical Examination:   General appearance: Well appearing, and in no distress  Mental status: Alert, oriented to person, place, and time  Skin: Warm & dry  Cardiovascular: Normal heart rate noted  Respiratory: Normal respiratory effort, no distress  Abdomen: Soft, gravid, nontender  Pelvic: Cervical exam performed  Dilation: 3.5 Effacement (%): 50 Station: -2  Extremities: Edema: None  Fetal Status: Fetal Heart Rate (bpm): 130 Fundal Height: 37 cm Movement: Present Presentation: Vertex  Chaperone: Angel, RN    Results for orders placed or performed in visit on 03/01/20 (from the past 24 hour(s))  POC Urinalysis [facility]   Collection Time: 03/01/20  2:58 PM  Result Value Ref Range   Color, UA     Clarity, UA     Glucose, UA Negative Negative   Bilirubin, UA     Ketones, UA neg    Spec Grav, UA     Blood, UA neg    pH, UA     POC,PROTEIN,UA  Negative Negative, Trace, Small (1+), Moderate (2+), Large (3+), 4+   Urobilinogen, UA     Nitrite, UA neg    Leukocytes, UA Negative Negative   Appearance     Odor      Assessment & Plan:  1) Low-risk pregnancy G3P1101 at [redacted]w[redacted]d with an Estimated Date of Delivery: 03/07/20   2) Hx IUFD r/t cord prolapse w/PPROB @ 22 weeks, prefers SOL   Meds: No orders of the defined types were placed in this encounter.  Labs/procedures today:   Plan:  Continue routine obstetrical care  Next visit: prefers in person    Reviewed: Term labor symptoms and general obstetric precautions including but not limited to vaginal bleeding, contractions, leaking of fluid and fetal movement were reviewed in detail with the patient.  All questions were answered. Has home bp cuff. . Check bp weekly, let us know if >140/90.   Follow-up: Return in about 1 week (around 03/08/2020) for  LROB and NST.  Orders Placed This Encounter  Procedures  . POC Urinalysis [facility]   Jacklyn Shell DNP, CNM 03/01/2020 3:27 PM

## 2020-03-01 NOTE — Patient Instructions (Signed)

## 2020-03-07 ENCOUNTER — Encounter (HOSPITAL_COMMUNITY): Payer: Self-pay | Admitting: Obstetrics and Gynecology

## 2020-03-07 ENCOUNTER — Inpatient Hospital Stay (HOSPITAL_COMMUNITY)
Admission: AD | Admit: 2020-03-07 | Discharge: 2020-03-09 | DRG: 807 | Disposition: A | Discharge status: Home / Self Care | Payer: 59 | Attending: Obstetrics & Gynecology | Admitting: Obstetrics & Gynecology

## 2020-03-07 ENCOUNTER — Inpatient Hospital Stay (HOSPITAL_COMMUNITY): Payer: 59 | Admitting: Anesthesiology

## 2020-03-07 ENCOUNTER — Other Ambulatory Visit: Payer: Self-pay

## 2020-03-07 DIAGNOSIS — Z3A40 40 weeks gestation of pregnancy: Secondary | ICD-10-CM

## 2020-03-07 DIAGNOSIS — Z6791 Unspecified blood type, Rh negative: Secondary | ICD-10-CM

## 2020-03-07 DIAGNOSIS — Z20822 Contact with and (suspected) exposure to covid-19: Secondary | ICD-10-CM | POA: Diagnosis present

## 2020-03-07 DIAGNOSIS — Z87891 Personal history of nicotine dependence: Secondary | ICD-10-CM | POA: Diagnosis not present

## 2020-03-07 DIAGNOSIS — Z3480 Encounter for supervision of other normal pregnancy, unspecified trimester: Secondary | ICD-10-CM

## 2020-03-07 DIAGNOSIS — J45909 Unspecified asthma, uncomplicated: Secondary | ICD-10-CM | POA: Diagnosis not present

## 2020-03-07 DIAGNOSIS — Z880 Allergy status to penicillin: Secondary | ICD-10-CM

## 2020-03-07 DIAGNOSIS — O26899 Other specified pregnancy related conditions, unspecified trimester: Secondary | ICD-10-CM

## 2020-03-07 DIAGNOSIS — O26893 Other specified pregnancy related conditions, third trimester: Secondary | ICD-10-CM | POA: Diagnosis present

## 2020-03-07 DIAGNOSIS — O9952 Diseases of the respiratory system complicating childbirth: Secondary | ICD-10-CM | POA: Diagnosis not present

## 2020-03-07 DIAGNOSIS — Z8751 Personal history of pre-term labor: Secondary | ICD-10-CM

## 2020-03-07 LAB — CBC
HCT: 34.3 % — ABNORMAL LOW (ref 36.0–46.0)
Hemoglobin: 11.4 g/dL — ABNORMAL LOW (ref 12.0–15.0)
MCH: 30.2 pg (ref 26.0–34.0)
MCHC: 33.2 g/dL (ref 30.0–36.0)
MCV: 90.7 fL (ref 80.0–100.0)
Platelets: 265 10*3/uL (ref 150–400)
RBC: 3.78 MIL/uL — ABNORMAL LOW (ref 3.87–5.11)
RDW: 13.2 % (ref 11.5–15.5)
WBC: 8.3 10*3/uL (ref 4.0–10.5)
nRBC: 0 % (ref 0.0–0.2)

## 2020-03-07 LAB — POCT FERN TEST: POCT Fern Test: NEGATIVE

## 2020-03-07 LAB — RESPIRATORY PANEL BY RT PCR (FLU A&B, COVID)
Influenza A by PCR: NEGATIVE
Influenza B by PCR: NEGATIVE
SARS Coronavirus 2 by RT PCR: NEGATIVE

## 2020-03-07 LAB — TYPE AND SCREEN
ABO/RH(D): O NEG
Antibody Screen: POSITIVE

## 2020-03-07 MED ORDER — LIDOCAINE HCL (PF) 1 % IJ SOLN: 30.0000 mL | INTRAMUSCULAR | Status: DC | PRN

## 2020-03-07 MED ORDER — FENTANYL-BUPIVACAINE-NACL 0.5-0.125-0.9 MG/250ML-% EP SOLN
12.0000 mL/h | EPIDURAL | Status: DC | PRN
  Filled 2020-03-07: qty 250

## 2020-03-07 MED ORDER — ONDANSETRON HCL 4 MG/2ML IJ SOLN
4.0000 mg | Freq: Four times a day (QID) | INTRAMUSCULAR | Status: DC | PRN
  Administered 2020-03-07: 4 mg via INTRAVENOUS
  Filled 2020-03-07: qty 2

## 2020-03-07 MED ORDER — LACTATED RINGERS IV SOLN
500.0000 mL | Freq: Once | INTRAVENOUS | Status: AC
  Administered 2020-03-07: 500 mL via INTRAVENOUS

## 2020-03-07 MED ORDER — EPHEDRINE 5 MG/ML INJ: 10.0000 mg | INTRAVENOUS | Status: DC | PRN

## 2020-03-07 MED ORDER — ACETAMINOPHEN 325 MG PO TABS: 650.0000 mg | ORAL_TABLET | ORAL | Status: DC | PRN

## 2020-03-07 MED ORDER — OXYTOCIN-SODIUM CHLORIDE 30-0.9 UT/500ML-% IV SOLN: 2.5000 [IU]/h | INTRAVENOUS | Status: DC

## 2020-03-07 MED ORDER — LACTATED RINGERS IV SOLN: INTRAVENOUS | Status: DC

## 2020-03-07 MED ORDER — OXYTOCIN BOLUS FROM INFUSION
333.0000 mL | Freq: Once | INTRAVENOUS | Status: AC
  Administered 2020-03-07: 333 mL via INTRAVENOUS

## 2020-03-07 MED ORDER — LACTATED RINGERS IV SOLN: 500.0000 mL | INTRAVENOUS | Status: DC | PRN

## 2020-03-07 MED ORDER — SOD CITRATE-CITRIC ACID 500-334 MG/5ML PO SOLN: 30.0000 mL | ORAL | Status: DC | PRN

## 2020-03-07 MED ORDER — PHENYLEPHRINE 40 MCG/ML (10ML) SYRINGE FOR IV PUSH (FOR BLOOD PRESSURE SUPPORT): 80.0000 ug | PREFILLED_SYRINGE | INTRAVENOUS | Status: DC | PRN

## 2020-03-07 MED ORDER — TERBUTALINE SULFATE 1 MG/ML IJ SOLN: 0.2500 mg | Freq: Once | INTRAMUSCULAR | Status: DC | PRN

## 2020-03-07 MED ORDER — OXYTOCIN-SODIUM CHLORIDE 30-0.9 UT/500ML-% IV SOLN
1.0000 m[IU]/min | INTRAVENOUS | Status: DC
  Administered 2020-03-07: 2 m[IU]/min via INTRAVENOUS
  Filled 2020-03-07: qty 500

## 2020-03-07 MED ORDER — DIPHENHYDRAMINE HCL 50 MG/ML IJ SOLN: 12.5000 mg | INTRAMUSCULAR | Status: DC | PRN

## 2020-03-07 MED ORDER — FENTANYL CITRATE (PF) 100 MCG/2ML IJ SOLN: 50.0000 ug | INTRAMUSCULAR | Status: DC | PRN

## 2020-03-07 MED ORDER — LIDOCAINE HCL (PF) 1 % IJ SOLN
INTRAMUSCULAR | Status: DC | PRN
  Administered 2020-03-07 (×2): 4 mL via EPIDURAL

## 2020-03-07 MED ORDER — FLEET ENEMA 7-19 GM/118ML RE ENEM: 1.0000 | ENEMA | RECTAL | Status: DC | PRN

## 2020-03-07 MED ORDER — SODIUM CHLORIDE (PF) 0.9 % IJ SOLN
INTRAMUSCULAR | Status: DC | PRN
  Administered 2020-03-07: 12 mL/h via EPIDURAL

## 2020-03-07 NOTE — MAU Note (Signed)
Pt transported to Labor and Delivery in stable condition via wheelchair.  Handoff given to receiving RN at the bedside.

## 2020-03-07 NOTE — H&P (Signed)
LABOR AND DELIVERY ADMISSION HISTORY AND PHYSICAL NOTE  Kelly Watts is a 24 y.o. female [id number] with IUP at [redacted]w[redacted]d by LMP c/w 8 wk sono presenting for SOL.  She reports positive fetal movement. She denies leakage of fluid or vaginal bleeding.  Prenatal History/Complications: PNC at [facility] Pregnancy complications:  - h/o PTD at 22 weeks due to cord prolapse, on vaginal progesterone this pregnancy  -Rh neg   Past Medical History: Past Medical History:  Diagnosis Date  . Angio-edema   . Asthma   . Eczema   . Encounter for Nexplanon removal 11/16/2015  . History of kidney stones   . History of kidney stones   . Kidney infection   . Nexplanon insertion 11/02/2012   Inserted left arm 11/02/12  . Patient underweight   . Recurrent upper respiratory infection (URI)   . Urticaria     Past Surgical History: Past Surgical History:  Procedure Laterality Date  . EAR TUBE REMOVAL    . EXTRACORPOREAL SHOCK WAVE LITHOTRIPSY Left 10/30/2016   Procedure: LEFT EXTRACORPOREAL SHOCK WAVE LITHOTRIPSY (ESWL);  Surgeon: Crist Fat, MD;  Location: [facility];  Service: Urology;  Laterality: Left;  . TYMPANOSTOMY TUBE PLACEMENT    . WISDOM TOOTH EXTRACTION      Obstetrical History: OB History    Gravida  3   Para  2   Term  1   Preterm  1   AB      Living  1     SAB      TAB      Ectopic      Multiple  0   Live Births  1           Social History: Social History   Socioeconomic History  . Marital status: Married    Spouse name: Kelly Watts  . Number of children: 1  . Years of education: Not on file  . Highest education level: Not on file  Occupational History  . Not on file  Tobacco Use  . Smoking status: Former Smoker    Packs/day: 0.25    Years: 3.00    Pack years: 0.75    Types: Cigarettes    Quit date: 01/30/2016    Years since quitting: 4.1  . Smokeless tobacco: Never Used  Vaping Use  . Vaping Use: Never used  Substance and Sexual Activity  .  Alcohol use: No    Comment: occ  . Drug use: No  . Sexual activity: Yes    Birth control/protection: None  Other Topics Concern  . Not on file  Social History Narrative  . Not on file   Social Determinants of Health   Financial Resource Strain: Low Risk   . Difficulty of Paying Living Expenses: Not hard at all  Food Insecurity: No Food Insecurity  . Worried About Programme researcher, broadcasting/film/video in the Last Year: Never true  . Ran Out of Food in the Last Year: Never true  Transportation Needs: No Transportation Needs  . Lack of Transportation (Medical): No  . Lack of Transportation (Non-Medical): No  Physical Activity: Insufficiently Active  . Days of Exercise per Week: 7 days  . Minutes of Exercise per Session: 10 min  Stress: No Stress Concern Present  . Feeling of Stress : Not at all  Social Connections: Moderately Isolated  . Frequency of Communication with Friends and Family: More than three times a week  . Frequency of Social Gatherings with Friends and Family: More  than three times a week  . Attends Religious Services: Never  . Active Member of Clubs or Organizations: No  . Attends Banker Meetings: Never  . Marital Status: Married    Family History: Family History  Problem Relation Age of Onset  . Allergic rhinitis Father   . Asthma Mother   . Allergic rhinitis Mother   . Coronary artery disease Paternal Grandfather   . Cancer Paternal Grandmother        breast  . Diabetes Maternal Grandmother   . Thyroid disease Maternal Grandmother   . Heart disease Maternal Grandmother   . Melanoma Maternal Grandfather   . Allergic rhinitis Brother   . Allergic rhinitis Brother   . Angioedema Neg Hx   . Atopy Neg Hx   . Eczema Neg Hx   . Immunodeficiency Neg Hx   . Urticaria Neg Hx     Allergies: Allergies  Allergen Reactions  . Penicillins Hives    Has patient had a PCN reaction causing immediate rash, facial/tongue/throat swelling, SOB or lightheadedness with  hypotension: Yes Has patient had a PCN reaction causing severe rash involving mucus membranes or skin necrosis: No Has patient had a PCN reaction that required hospitalization: No Has patient had a PCN reaction occurring within the last 10 years: No If all of the above answers are "NO", then may proceed with Cephalosporin use.   . Sulfa Antibiotics Hives    Medications Prior to Admission  Medication Sig Dispense Refill Last Dose  . Prenatal Vit-Fe Fumarate-FA (PRENATAL VITAMIN PO) Take by mouth.   03/06/2020 at Unknown time     Review of Systems  All systems reviewed and negative except as stated in HPI  Physical Exam Blood pressure 121/74, pulse 72, temperature 98.1 F (36.7 C), temperature source Oral, resp. rate 16, height 5\' 3"  (1.6 m), weight 63.9 kg, last menstrual period 06/01/2019, SpO2 99 %. General appearance: alert, oriented, NAD Lungs: normal respiratory effort Heart: regular rate Abdomen: soft, non-tender; gravid, FH appropriate for GA Extremities: No calf swelling or tenderness Presentation: cephalic Fetal monitoring: 140 bpm, moderate variability, +accels, no decels  Uterine activity: q3-6 minutes  Dilation: 4.5 Effacement (%): 70 Station: -2 Exam by:: [email or web address], RN  Prenatal labs: ABO, Rh: --/--/PENDING (11/03 1335) Antibody: PENDING (11/03 1335) Rubella: 2.49 (04/21 1638) RPR: Non Reactive (08/02 0838)  HBsAg: Negative (04/21 1638)  HIV: Non Reactive (08/02 0838)  GC/Chlamydia: Negative  GBS: --02-05-1977 (10/14 1330)  2-hr GTT: Normal  Genetic screening:  Normal  Anatomy 07-18-2002: Normal   Prenatal Transfer Tool  Maternal Diabetes: No Genetic Screening: Normal Maternal Ultrasounds/Referrals: Normal Fetal Ultrasounds or other Referrals:  None Maternal Substance Abuse:  No Significant Maternal Medications:  Meds include: Progesterone Significant Maternal Lab Results: Rh negative  Results for orders placed or performed during the hospital encounter of  03/07/20 (from the past 24 hour(s))  POCT fern test   Collection Time: 03/07/20 12:15 PM  Result Value Ref Range   POCT Fern Test Negative = intact amniotic membranes   Respiratory Panel by RT PCR (Flu A&B, Covid) - Nasopharyngeal Swab   Collection Time: 03/07/20  1:32 PM   Specimen: Nasopharyngeal Swab  Result Value Ref Range   SARS Coronavirus 2 by RT PCR NEGATIVE NEGATIVE   Influenza A by PCR NEGATIVE NEGATIVE   Influenza B by PCR NEGATIVE NEGATIVE  CBC   Collection Time: 03/07/20  1:35 PM  Result Value Ref Range   WBC 8.3 4.0 - 10.5 K/uL  RBC 3.78 (L) 3.87 - 5.11 MIL/uL   Hemoglobin 11.4 (L) 12.0 - 15.0 g/dL   HCT 67.6 (L) 36 - 46 %   MCV 90.7 80.0 - 100.0 fL   MCH 30.2 26.0 - 34.0 pg   MCHC 33.2 30.0 - 36.0 g/dL   RDW 19.5 09.3 - 26.7 %   Platelets 265 150 - 400 K/uL   nRBC 0.0 0.0 - 0.2 %  Type and screen [facility]   Collection Time: 03/07/20  1:35 PM  Result Value Ref Range   ABO/RH(D) PENDING    Antibody Screen PENDING    Sample Expiration      03/10/2020,2359 Performed at [facility], [address] [place], [place] [address]     Patient Active Problem List   Diagnosis Date Noted  . Normal labor 03/07/2020  . Encounter for supervision of other normal pregnancy, unspecified trimester 08/24/2019  . History of preterm delivery 01/20/2017  . Kidney stones 10/13/2016  . Kidney cyst, acquired 10/13/2016  . Rh negative status during pregnancy 07/21/2012    Assessment: Kelly Watts is a 24 y.o. G3P1101 at [redacted]w[redacted]d here for SOL.   #Labor: Expectant management  #Pain: Unsure about epidural. Discussed pain management options.  #FWB: Cat I  #ID:  GBS neg  #MOF: Breast  #MOC: Nexplanon  #Circ:  N/A   De Hollingshead 03/07/2020, 2:36 PM

## 2020-03-07 NOTE — Progress Notes (Signed)
Patient ID: Kelly Watts, female   DOB: Mar 09, 1996, 24 y.o.   MRN: [id number]  Awaiting epidural placement soon; breathing w ctx and is very uncomfortable  BP 119/75, P 74 FHR 135-140, +accels, no decels, Cat 1 Cx 2-3 mins at 86mu/min Cx was 5+/50/vtx -2 at start of Pit  IUP@40 .0wks Early active labor GBS neg  Plan to check cx once comfortable w epidural Anticipate vag del  Arabella Merles CNM 03/07/2020

## 2020-03-07 NOTE — Anesthesia Preprocedure Evaluation (Signed)
Anesthesia Evaluation  Patient identified by MRN, date of birth, ID band Patient awake    Reviewed: Allergy & Precautions, Patient's Chart, lab work & pertinent test results  Airway Mallampati: II  TM Distance: >3 FB Neck ROM: Full    Dental no notable dental hx. (+) Teeth Intact   Pulmonary asthma , Recent URI , Resolved, former smoker,    Pulmonary exam normal breath sounds clear to auscultation       Cardiovascular negative cardio ROS Normal cardiovascular exam Rhythm:Regular Rate:Normal     Neuro/Psych negative neurological ROS  negative psych ROS   GI/Hepatic negative GI ROS, Neg liver ROS,   Endo/Other  negative endocrine ROS  Renal/GU Renal diseaseHx/o renal calculi  negative genitourinary   Musculoskeletal negative musculoskeletal ROS (+)   Abdominal   Peds  Hematology  (+) anemia , Hx/o angioedema   Anesthesia Other Findings   Reproductive/Obstetrics (+) Pregnancy                             Anesthesia Physical Anesthesia Plan  ASA: II  Anesthesia Plan: General   Post-op Pain Management:    Induction: Intravenous  PONV Risk Score and Plan:   Airway Management Planned: Natural Airway  Additional Equipment:   Intra-op Plan:   Post-operative Plan:   Informed Consent: I have reviewed the patients History and Physical, chart, labs and discussed the procedure including the risks, benefits and alternatives for the proposed anesthesia with the patient or authorized representative who has indicated his/her understanding and acceptance.       Plan Discussed with: Anesthesiologist  Anesthesia Plan Comments:         Anesthesia Quick Evaluation

## 2020-03-07 NOTE — Anesthesia Procedure Notes (Signed)
Epidural Patient location during procedure: OB Start time: 03/07/2020 7:44 PM End time: 03/07/2020 7:41 PM  Staffing Anesthesiologist: Mal Amabile, MD Performed: anesthesiologist   Preanesthetic Checklist Completed: patient identified, IV checked, site marked, risks and benefits discussed, surgical consent, monitors and equipment checked, pre-op evaluation and timeout performed  Epidural Patient position: sitting Prep: DuraPrep and site prepped and draped Patient monitoring: continuous pulse ox and blood pressure Approach: midline Location: L3-L4 Injection technique: LOR air  Needle:  Needle type: Tuohy  Needle gauge: 17 G Needle length: 9 cm and 9 Needle insertion depth: 4 cm Catheter type: closed end flexible Catheter size: 19 Gauge Catheter at skin depth: 9 cm Test dose: negative and Other  Assessment Events: blood not aspirated, injection not painful, no injection resistance, no paresthesia and negative IV test  Additional Notes Patient identified. Risks and benefits discussed including failed block, incomplete  Pain control, post dural puncture headache, nerve damage, paralysis, blood pressure Changes, nausea, vomiting, reactions to medications-both toxic and allergic and post Partum back pain. All questions were answered. Patient expressed understanding and wished to proceed. Sterile technique was used throughout procedure. Epidural site was Dressed with sterile barrier dressing. No paresthesias, signs of intravascular injection Or signs of intrathecal spread were encountered.  Patient was more comfortable after the epidural was dosed. Please see RN's note for documentation of vital signs and FHR which are stable. Reason for block:procedure for pain

## 2020-03-07 NOTE — MAU Note (Signed)
Presents with c/o ctxs every 3-5 minutes apart.  Denies VB or LOF. Endorses +FM.

## 2020-03-07 NOTE — Progress Notes (Signed)
Labor Progress Note Kelly Watts is a 24 y.o. G3P1101 at [redacted]w[redacted]d presented for SOL S: Comfortable with epidural, no concerns  O:  BP 112/71    Pulse 65    Temp 98.1 F (36.7 C) (Oral)    Resp 16    Ht 5\' 3"  (1.6 m)    Wt 63.9 kg    LMP 06/01/2019    SpO2 99%    BMI 24.95 kg/m  EFM: 139/mod/accels present/ intermittent variables  CVE: Dilation: 8 Effacement (%): 100 Cervical Position: Middle Station: -1 Presentation: Vertex Exam by:: Mary [email or web address] Johnson, RN    A&P: 24 y.o. 25 [redacted]w[redacted]d  #Labor: Initial CVE was 4.5/70/-2. SROM around 1030. Progressed well to active labor, started augmentation with Pitocin.  #Pain: epidural, assymetric analgesia with some left hip pain #FWB: Cat 2 due to recurrent variables with prompt recovery and reassuring variability. #GBS negative #Rh negative: give rhogam post delivery  01-03-1996, MD 10:41 PM

## 2020-03-08 ENCOUNTER — Encounter (HOSPITAL_COMMUNITY): Payer: Self-pay | Admitting: Obstetrics and Gynecology

## 2020-03-08 ENCOUNTER — Other Ambulatory Visit: Payer: 59 | Admitting: Family Medicine

## 2020-03-08 ENCOUNTER — Ambulatory Visit: Payer: 59 | Admitting: Women's Health

## 2020-03-08 LAB — RPR: RPR Ser Ql: NONREACTIVE

## 2020-03-08 MED ORDER — ACETAMINOPHEN 500 MG PO TABS: 1000.0000 mg | ORAL_TABLET | Freq: Three times a day (TID) | ORAL | Status: DC | PRN

## 2020-03-08 MED ORDER — PRENATAL MULTIVITAMIN CH
1.0000 | ORAL_TABLET | Freq: Every day | ORAL | Status: DC
  Administered 2020-03-08 – 2020-03-09 (×2): 1 via ORAL
  Filled 2020-03-08 (×2): qty 1

## 2020-03-08 MED ORDER — BENZOCAINE-MENTHOL 20-0.5 % EX AERO
1.0000 "application " | INHALATION_SPRAY | CUTANEOUS | Status: DC | PRN
  Administered 2020-03-08: 1 via TOPICAL
  Filled 2020-03-08: qty 56

## 2020-03-08 MED ORDER — RHO D IMMUNE GLOBULIN 1500 UNIT/2ML IJ SOSY
300.0000 ug | PREFILLED_SYRINGE | Freq: Once | INTRAMUSCULAR | Status: AC
  Administered 2020-03-08: 300 ug via INTRAVENOUS
  Filled 2020-03-08: qty 2

## 2020-03-08 MED ORDER — TETANUS-DIPHTH-ACELL PERTUSSIS 5-2.5-18.5 LF-MCG/0.5 IM SUSY: 0.5000 mL | PREFILLED_SYRINGE | Freq: Once | INTRAMUSCULAR | Status: DC

## 2020-03-08 MED ORDER — SENNOSIDES-DOCUSATE SODIUM 8.6-50 MG PO TABS
2.0000 | ORAL_TABLET | ORAL | Status: DC
  Administered 2020-03-08 – 2020-03-09 (×2): 2 via ORAL
  Filled 2020-03-08 (×2): qty 2

## 2020-03-08 MED ORDER — DIPHENHYDRAMINE HCL 25 MG PO CAPS: 25.0000 mg | ORAL_CAPSULE | Freq: Four times a day (QID) | ORAL | Status: DC | PRN

## 2020-03-08 MED ORDER — WITCH HAZEL-GLYCERIN EX PADS: 1.0000 "application " | MEDICATED_PAD | CUTANEOUS | Status: DC | PRN

## 2020-03-08 MED ORDER — COCONUT OIL OIL
1.0000 "application " | TOPICAL_OIL | Status: DC | PRN
  Administered 2020-03-08: 1 via TOPICAL

## 2020-03-08 MED ORDER — SIMETHICONE 80 MG PO CHEW: 80.0000 mg | CHEWABLE_TABLET | ORAL | Status: DC | PRN

## 2020-03-08 MED ORDER — DIBUCAINE (PERIANAL) 1 % EX OINT: 1.0000 "application " | TOPICAL_OINTMENT | CUTANEOUS | Status: DC | PRN

## 2020-03-08 MED ORDER — ONDANSETRON HCL 4 MG PO TABS: 4.0000 mg | ORAL_TABLET | ORAL | Status: DC | PRN

## 2020-03-08 MED ORDER — INFLUENZA VAC SPLIT QUAD 0.5 ML IM SUSY: 0.5000 mL | PREFILLED_SYRINGE | INTRAMUSCULAR | Status: DC

## 2020-03-08 MED ORDER — MELATONIN 3 MG PO TABS
3.0000 mg | ORAL_TABLET | Freq: Every evening | ORAL | Status: DC | PRN
  Filled 2020-03-08: qty 1

## 2020-03-08 MED ORDER — IBUPROFEN 800 MG PO TABS
800.0000 mg | ORAL_TABLET | Freq: Three times a day (TID) | ORAL | Status: DC
  Administered 2020-03-08 – 2020-03-09 (×5): 800 mg via ORAL
  Filled 2020-03-08 (×6): qty 1

## 2020-03-08 MED ORDER — ONDANSETRON HCL 4 MG/2ML IJ SOLN: 4.0000 mg | INTRAMUSCULAR | Status: DC | PRN

## 2020-03-08 NOTE — Discharge Summary (Signed)
Postpartum Discharge Summary  Date of Service updated 03/09/20      Patient Name: Kelly Watts DOB: 03-12-1996 MRN: [id number]  Date of admission: 03/07/2020 Delivery date:03/07/2020  Delivering provider: [facility], Joneen Caraway C  Date of discharge: 03/09/2020  Admitting diagnosis: Normal labor [O80, Z37.9] Intrauterine pregnancy: [redacted]w[redacted]d     Secondary diagnosis:  Active Problems:   Rh negative status during pregnancy   History of preterm delivery   Normal labor  Additional problems: none    Discharge diagnosis: Term Pregnancy Delivered                                              Post partum procedures:rhogam Augmentation: Pitocin Complications: None  Hospital course: Onset of Labor With Vaginal Delivery      24 y.o. yo G3P1101 at [redacted]w[redacted]d was admitted in Latent Labor on 03/07/2020. Patient had an uncomplicated labor course, including Pitocin augmentation followed by SROM and then progression to vag del as follows:  Membrane Rupture Time/Date: 8:39 PM ,03/07/2020   Delivery Method:Vaginal, Spontaneous  Episiotomy: None  Lacerations:  Periurethral - hemostatic, not repaired Patient had an uncomplicated postpartum course.  She is ambulating, tolerating a regular diet, passing flatus, and urinating well. Patient is discharged home in stable condition on 03/09/20.  Newborn Data: Birth date:03/07/2020  Birth time:11:26 PM  Gender:Female  Living status:Living  Apgars:9 ,9  Weight:2690 g (5lb 14.9oz)  Magnesium Sulfate received: No BMZ received: No Rhophylac:Yes MMR:N/A T-DaP:Given prenatally Flu: No Transfusion:No  Physical exam  Vitals:   03/08/20 1050 03/08/20 1520 03/08/20 1951 03/09/20 0602  BP: 101/68 108/71 110/70 111/75  Pulse: 69 84 60 74  Resp: $Remo'16 17 16 18  'jAaaV$ Temp: 97.8 F (36.6 C) 97.8 F (36.6 C) 97.8 F (36.6 C) 97.6 F (36.4 C)  TempSrc: Oral  Oral Oral  SpO2:  98% 100% 100%  Weight:      Height:       General: alert and no distress Lochia:  appropriate Uterine Fundus: firm Incision: N/A DVT Evaluation: No evidence of DVT seen on physical exam. No cords or calf tenderness. No significant calf/ankle edema. Labs: Lab Results  Component Value Date   WBC 8.3 03/07/2020   HGB 11.4 (L) 03/07/2020   HCT 34.3 (L) 03/07/2020   MCV 90.7 03/07/2020   PLT 265 03/07/2020   CMP Latest Ref Rng & Units 04/08/2019  Glucose 65 - 99 mg/dL 80  BUN 6 - 20 mg/dL 14  Creatinine 0.57 - 1.00 mg/dL 0.86  Sodium 134 - 144 mmol/L 143  Potassium 3.5 - 5.2 mmol/L 4.3  Chloride 96 - 106 mmol/L 102  CO2 20 - 29 mmol/L 25  Calcium 8.7 - 10.2 mg/dL 9.5  Total Protein 6.0 - 8.5 g/dL 7.2  Total Bilirubin 0.0 - 1.2 mg/dL 0.4  Alkaline Phos 39 - 117 IU/L 93  AST 0 - 40 IU/L 13  ALT 0 - 32 IU/L 16   Edinburgh Score: Edinburgh Postnatal Depression Scale Screening Tool 09/18/2017  I have been able to laugh and see the funny side of things. 0  I have looked forward with enjoyment to things. 0  I have blamed myself unnecessarily when things went wrong. 3  I have been anxious or worried for no good reason. 0  I have felt scared or panicky for no good reason. 0  Things have been getting on top  of me. 0  I have been so unhappy that I have had difficulty sleeping. 0  I have felt sad or miserable. 0  I have been so unhappy that I have been crying. 0  The thought of harming myself has occurred to me. 0  Edinburgh Postnatal Depression Scale Total 3     After visit meds:  Allergies as of 03/09/2020      Reactions   Penicillins Hives   Has patient had a PCN reaction causing immediate rash, facial/tongue/throat swelling, SOB or lightheadedness with hypotension: Yes Has patient had a PCN reaction causing severe rash involving mucus membranes or skin necrosis: No Has patient had a PCN reaction that required hospitalization: No Has patient had a PCN reaction occurring within the last 10 years: No If all of the above answers are "NO", then may proceed with  Cephalosporin use.   Sulfa Antibiotics Hives      Medication List    TAKE these medications   ibuprofen 800 MG tablet Commonly known as: ADVIL Take 1 tablet (800 mg total) by mouth every 8 (eight) hours.   PRENATAL VITAMIN PO Take by mouth.        Discharge home in stable condition Infant Feeding: Breast Infant Disposition:home with mother Discharge instruction: per After Visit Summary and Postpartum booklet. Activity: Advance as tolerated. Pelvic rest for 6 weeks.  Diet: routine diet Future Appointments: Future Appointments  Date Time Provider [place]  04/11/2020  2:10 PM Myrtis Ser, CNM [facility]   Follow up Visit:  Follow-up Information    [name] Follow up on 04/11/2020.   Specialty: Obstetrics and Gynecology Why: for postpartum checkup Contact information: [address] [place], [place], CNM  [name] Please schedule this patient for Postpartum visit in: 4 weeks with the following provider: Any provider  In-Person  For C/S patients schedule nurse incision check in weeks 2 weeks: no  Low risk pregnancy complicated by: hx 48GN preterm delivery  Delivery mode: SVD  Anticipated Birth Control: Nexplanon in office  PP Procedures needed: Pap  Schedule Integrated [place] visit: no    03/09/2020 [name], CNM

## 2020-03-08 NOTE — Discharge Instructions (Signed)

## 2020-03-08 NOTE — Lactation Note (Signed)
This note was copied from a baby's chart. Lactation Consultation Note  Patient Name: Kelly Watts [id number] Date: 03/08/2020 Reason for consult: Follow-up assessment;Mother's request;Difficult latch;Term;Infant < 6lbs;Nipple pain/trauma;Other (Comment) (Mom states she has pain with pumping but not with latching at the breast.)  Infant is 40 weeks and 18 hours old. Mom states infant latching and doing well during the night. However, in the daytime she has been very sleepy and not feeding as long. Feeding times since this morning been 10-15 minutes. Mom also gave EBM via spoon 2.65ml at 1520 today.   Mom was pumping at the time of the visit. Mom stated she had pain with pumping but not with the latch. LC increased flange size to 27. RN will also give coconut oil for nipple care. Mom is aware to use oil even when pumping to line the flanges.   Mom had difficulty latching infant on the left side. Left nipple is larger than the Right. Infant in an outfit, LC removed clothes with permission and placed her STS. Infant latched on the left side for 20 minutes and then switched to the right side. Signs of milk transfer noted during feedings.   LC reviewed behavior of LPTI to decrease calorie loss. Mom has been pumping using DEBP after each feed q 3 hours. Mom only able to collect 2 ml after pumping 3x today.   LC reviewed with Mom ways to supplement infant after nursing at the breast. Mom will use her EBM and she is bringing her frozen breast milk from home.   Mom will meet with the RN, Claudina Lick, to complete donor breast milk consent.  Mom hopes to continue pumping using her EBM or DBM for supplementation based on LPTI guidelines and hours after birth.   Plan 1. To feed based on cues 8-12 x in 24 hour period no more than 3 hours without an attempt. Mom to offer both breasts and ensure signs of milk transfer during feedings.         2. Mom to supplement based on LPTI reviewed based on hours after  birth with spoon feeding for now.of EBM. Parents are meeting with RN currently to review DBM as an option.         3. I's and O's sheet reviewed with parents.          4. All questions answered at the end of the visit with Mother.

## 2020-03-08 NOTE — Lactation Note (Signed)
This note was copied from a baby's chart. Lactation Consultation Note  Patient Name: Kelly Watts [id number] Date: 03/08/2020 Reason for consult: Initial assessment;Term;Infant < 6lbs P2, 3 hour term infant. Infant had one void since birth Per mom, she BF her 1st child for 13 months. Mom feels breastfeeding is going well, infant BF for 35 minutes in L&D. Mom is [employer] Employee per mom, she has Medela DEBP at home. LC entered the room, mom latched infant on her left breast, infant was latched with depth, swallows observed, infant BF for 20 minutes. LC reviewed hand expression and infant was given 5 mls of colostrum by spoon. Afterwards mom used the DEBP, and was expressing colostrum in pump as LC left the room. Mom shown how to use DEBP & how to disassemble, clean, & reassemble parts. Mom knows to BF infant according to cues, 8 to 12+ times within 24 hours, STS. Mom will latch infant at breast and afterwards will give infant any EBM that she pumped or hand express due infant's small size. Mom knows to call RN or LC if she has questions, concerns or needs assistance with latching infant at the breast. LC discussed mom attending [facility] after hospital discharge. Mom made aware of O/P services, breastfeeding support groups, community resources, and our phone # for post-discharge questions.  Maternal Data Formula Feeding for Exclusion: No Has patient been taught Hand Expression?: Yes Does the patient have breastfeeding experience prior to this delivery?: Yes  Feeding Feeding Type: Breast Fed  LATCH Score Latch: Grasps breast easily, tongue down, lips flanged, rhythmical sucking.  Audible Swallowing: Spontaneous and intermittent  Type of Nipple: Everted at rest and after stimulation  Comfort (Breast/Nipple): Soft / non-tender  Hold (Positioning): No assistance needed to correctly position infant at breast.  LATCH Score:  10  Interventions Interventions: Breast feeding basics reviewed;Skin to skin;Breast compression;DEBP;Hand express  Lactation Tools Discussed/Used WIC Program: No Pump Review: Setup, frequency, and cleaning;Milk Storage Initiated by:: Danelle Earthly Date initiated:: 03/08/20   Consult Status Consult Status: Follow-up Date: 03/08/20 Follow-up type: In-patient    Danelle Earthly 03/08/2020, 3:25 AM

## 2020-03-08 NOTE — Anesthesia Postprocedure Evaluation (Signed)
Anesthesia Post Note  Patient: Kelly Watts  Procedure(s) Performed: AN AD HOC LABOR EPIDURAL     Patient location during evaluation: Mother Baby Anesthesia Type: Epidural Level of consciousness: awake, awake and alert and oriented Pain management: pain level controlled Vital Signs Assessment: post-procedure vital signs reviewed and stable Respiratory status: spontaneous breathing and respiratory function stable Cardiovascular status: blood pressure returned to baseline Postop Assessment: no headache, epidural receding, patient able to bend at knees, adequate PO intake, no backache, no apparent nausea or vomiting and able to ambulate Anesthetic complications: no   No complications documented.  Last Vitals:  Vitals:   03/08/20 0251 03/08/20 0636  BP: 111/66 (!) 98/54  Pulse: 79 60  Resp: 16 16  Temp: 36.7 C 36.8 C  SpO2: 97% 98%    Last Pain:  Vitals:   03/08/20 0715  TempSrc:   PainSc: 0-No pain   Pain Goal:                   Cleda Clarks

## 2020-03-08 NOTE — Progress Notes (Signed)
Post Partum Day 1 Subjective: no complaints, up ad lib and voiding  Objective: Blood pressure (!) 98/54, pulse 60, temperature 98.2 F (36.8 C), temperature source Oral, resp. rate 16, height 5\' 3"  (1.6 m), weight 63.9 kg, last menstrual period 06/01/2019, SpO2 98 %, unknown if currently breastfeeding.  Physical Exam:  General: alert and no distress Lochia: appropriate Uterine Fundus: firm DVT Evaluation: No evidence of DVT seen on physical exam. No cords or calf tenderness. No significant calf/ankle edema.  Recent Labs    03/07/20 1335  HGB 11.4*  HCT 34.3*    Assessment/Plan: Plan for discharge tomorrow, Breastfeeding, Lactation consult and Contraception nexplanon outpatient   LOS: 1 day   13/03/21 03/08/2020, 7:38 AM

## 2020-03-09 LAB — RH IG WORKUP (INCLUDES ABO/RH)
ABO/RH(D): O NEG
Fetal Screen: NEGATIVE
Gestational Age(Wks): 40
Unit division: 0

## 2020-03-09 MED ORDER — IBUPROFEN 800 MG PO TABS: 800.0000 mg | ORAL_TABLET | Freq: Three times a day (TID) | ORAL | 0 refills | Status: DC

## 2020-03-09 NOTE — Lactation Note (Signed)
This note was copied from a baby's chart. Lactation Consultation Note  Patient Name: Kelly Watts CNOBS'J Date: 03/09/2020 Reason for consult: Follow-up assessment   P2 mother whose infant is now 59 hours old.  This is a term baby at 40+0 weeks weighing < 6 lbs.  Mother breast fed her first child (now 49 1/24 years old) for 13 months.  Family will be staying until late afternoon: pediatrician has ordered a Tcb level to be drawn at 1500.  Grandmother holding baby and I suggested opening the blinds to keep her in the sunlight to help with the jaundice.  Mother unswaddled and will continue to hold her in the light.  Mother has no questions/concerns related to breast feeding.  Baby has been sleepy at the breast.  Reviewed techniques and positioning to best allow baby to stay awake during feedings.  Mother has been using the cradle and football holds.  Offered to assist with latching if desired.  Mother will continue to feed 8-12 times/24 hours or sooner if baby shows cues.  Engorgement prevention/treatment discussed.  Mother has a manual pump and a DEBP for home use.  Mother is pumping to help obtain EBM for spoon feeding baby.  Family has a good support system and help available daily as needed.  Mother has our OP phone number for any concerns after discharge.   Maternal Data    Feeding Feeding Type: Breast Fed  LATCH Score Latch: Repeated attempts needed to sustain latch, nipple held in mouth throughout feeding, stimulation needed to elicit sucking reflex.  Audible Swallowing: Spontaneous and intermittent  Type of Nipple: Everted at rest and after stimulation  Comfort (Breast/Nipple): Soft / non-tender  Hold (Positioning): Assistance needed to correctly position infant at breast and maintain latch.  LATCH Score: 8  Interventions    Lactation Tools Discussed/Used     Consult Status Consult Status: Complete Date: 03/09/20 Follow-up type: Call as needed    Azaan Leask R  Saia Derossett 03/09/2020, 10:38 AM

## 2020-04-11 ENCOUNTER — Other Ambulatory Visit: Payer: Self-pay

## 2020-04-11 ENCOUNTER — Encounter: Payer: Self-pay | Admitting: Advanced Practice Midwife

## 2020-04-11 ENCOUNTER — Ambulatory Visit (INDEPENDENT_AMBULATORY_CARE_PROVIDER_SITE_OTHER): Payer: 59 | Admitting: Advanced Practice Midwife

## 2020-04-11 DIAGNOSIS — Z3202 Encounter for pregnancy test, result negative: Secondary | ICD-10-CM | POA: Diagnosis not present

## 2020-04-11 DIAGNOSIS — Z30017 Encounter for initial prescription of implantable subdermal contraceptive: Secondary | ICD-10-CM | POA: Diagnosis not present

## 2020-04-11 LAB — POCT URINE PREGNANCY: Preg Test, Ur: NEGATIVE

## 2020-04-11 MED ORDER — ETONOGESTREL 68 MG ~~LOC~~ IMPL
68.0000 mg | DRUG_IMPLANT | Freq: Once | SUBCUTANEOUS | Status: AC
  Administered 2020-04-11: 68 mg via SUBCUTANEOUS

## 2020-04-11 NOTE — Patient Instructions (Signed)
Nexplanon Instructions After Insertion  Keep bandage clean and dry for 24 hours  May use ice/Tylenol/Ibuprofen for soreness or pain  If you develop fever, drainage or increased warmth from incision site-contact office immediately   

## 2020-04-11 NOTE — Progress Notes (Signed)
POSTPARTUM VISIT Patient name: Kelly Watts MRN [id number]  Date of birth: 01/27/1996 Chief Complaint:   Postpartum Care (interested in [place])  History of Present Illness:   Kelly Watts is a 24 y.o. [phone] Caucasian female being seen today for a postpartum visit. She is 5 weeks postpartum following a spontaneous vaginal delivery at 40.0 gestational weeks. IOL: No  Anesthesia: epidural.  Laceration: periurethral abrasion- no repair.  Complications: none. Inpatient contraception: no.   Pregnancy complicated by Rh neg- received Rhogam in August. Tobacco use: no. Substance use disorder: no. Last pap smear: Sept 2018 and results were normal. Next pap smear due: first avail in 2022 No LMP recorded.  Postpartum course has been uncomplicated. Bleeding staining only. Bowel function is normal. Bladder function is normal. Urinary incontinence? No, fecal incontinence? No Patient is not sexually active. Last sexual activity: prior to delivery.  Desired contraception: Nexplanon. Patient does want a pregnancy in the future.  Desired family size is 3 children.   Upstream - 04/11/20 1426      Pregnancy Intention Screening   Does the patient want to become pregnant in the next year? No    Does the patient's partner want to become pregnant in the next year? No    Would the patient like to discuss contraceptive options today? Yes      Contraception Wrap Up   Current Method Abstinence    End Method Hormonal Implant          The pregnancy intention screening data noted above was reviewed. Potential methods of contraception were discussed. The patient elected to proceed with Hormonal Implant.   Edinburgh Postpartum Depression Screening: negative  Edinburgh Postnatal Depression Scale - 04/11/20 1414      Edinburgh Postnatal Depression Scale:  In the Past 7 Days   I have been able to laugh and see the funny side of things. 0    I have looked forward with enjoyment to things. 0    I have  blamed myself unnecessarily when things went wrong. 1    I have been anxious or worried for no good reason. 1    I have felt scared or panicky for no good reason. 0    Things have been getting on top of me. 0    I have been so unhappy that I have had difficulty sleeping. 0    I have felt sad or miserable. 0    I have been so unhappy that I have been crying. 0    The thought of harming myself has occurred to me. 0    Edinburgh Postnatal Depression Scale Total 2          Baby's course has been complicated by needing to supplement/pump and feed w bottle. Baby is feeding by breast and bottle: milk supply adequate. Infant has a pediatrician/family doctor? Yes.  Childcare strategy if returning to work/school: will be staying home.  Pt has material needs met for her and baby: Yes.   Review of Systems:   Pertinent items are noted in HPI Denies Abnormal vaginal discharge w/ itching/odor/irritation, headaches, visual changes, shortness of breath, chest pain, abdominal pain, severe nausea/vomiting, or problems with urination or bowel movements. Pertinent History Reviewed:  Reviewed past medical,surgical, obstetrical and family history.  Reviewed problem list, medications and allergies. OB History  Gravida Para Term Preterm AB Living  _0 SAB TAB Ectopic Multiple Live Births        0  2    # Outcome Date GA Lbr Len/2nd Weight Sex Delivery Anes PTL Lv  3 Term 03/07/20 69w0d03:50 / 00:06 5 lb 14.9 oz (2.69 kg) F Vag-Spont EPI  LIV  2 Term 08/11/17 354w1d5:19 / 01:00 7 lb 12.5 oz (3.53 kg) F Vag-Spont EPI N LIV  1 Preterm 07/25/12 2231w3d:35 / 00:04 15 oz (0.425 kg) F Vag-Spont None  FD   Physical Assessment:   Vitals:   04/11/20 1418  BP: 104/67  Pulse: 72  Weight: 123 lb (55.8 kg)  Height: _0  (1.6 m)  Body mass index is 21.79 kg/m.       Physical Examination:   General appearance: alert, well appearing, and in no distress  Mental status: alert, oriented to person, place,  and time  Skin: warm & dry   Cardiovascular: normal heart rate noted   Respiratory: normal respiratory effort, no distress   Breasts: deferred, no complaints   Abdomen: soft, non-tender   Pelvic: normal external genitalia, vulva, vagina, cervix, uterus and adnexa  Rectal: no hemorrhoids  Extremities: no edema         Results for orders placed or performed in visit on 04/11/20 (from the past 24 hour(s))  POCT urine pregnancy   Collection Time: 04/11/20  2:22 PM  Result Value Ref Range   Preg Test, Ur Negative Negative    Assessment & Plan:  1) Postpartum exam 2) Five wks s/p spontaneous vaginal delivery 3) breast & bottle feeding 4) Depression screening- negative 5) Contraception management- desires Nexplanon insertion today  Essential components of care per ACOG recommendations:  1.  Mood and well being:  . If positive depression screen, discussed and plan developed.  . If using tobacco we discussed reduction/cessation and risk of relapse . If current substance abuse, we discussed and referral to local resources was offered.   2. Infant care and feeding:  . If breastfeeding, discussed returning to work, pumping, breastfeeding-associated pain, guidance regarding return to fertility while lactating if not using another method. If needed, patient was provided with a letter to be allowed to pump q 2-3hrs to support lactation in a private location with access to a refrigerator to store breastmilk.   . Recommended that all caregivers be immunized for flu, pertussis and other preventable communicable diseases . If pt does not have material needs met for her/baby, referred to local resources for help obtaining these.  3. Sexuality, contraception and birth spacing . Provided guidance regarding sexuality, management of dyspareunia, and resumption of intercourse . Discussed avoiding interpregnancy interval <6mt42mand recommended birth spacing of 18 months  4. Sleep and  fatigue . Discussed coping options for fatigue and sleep disruption . Encouraged family/partner/community support of 4 hrs of uninterrupted sleep to help with mood and fatigue  5. Physical recovery  . If pt had a C/S, assessed incisional pain and providing guidance on normal vs prolonged recovery . If pt had a laceration, perineal healing and pain reviewed.  . If urinary or fecal incontinence, discussed management and referred to PT or uro/gyn if indicated  . Patient is safe to resume physical activity. Discussed attainment of healthy weight.  6.  Chronic disease management . Discussed pregnancy complications if any, and their implications for future childbearing and long-term maternal health. . Review recommendations for prevention of recurrent pregnancy complications, such as 17 hydroxyprogesterone caproate to reduce risk for recurrent PTB- yes, she had hx of 22wk IUFD and is aware of vag progesterone, or aspirin to  reduce risk of preeclampsia not applicable. . Pt had GDM: No. If yes, 2hr GTT scheduled: not applicable. . Reviewed medications and non-pregnant dosing including consideration of whether pt is breastfeeding using a reliable resource such as LactMed: yes . Referred for f/u w/ PCP or subspecialist providers as indicated: not applicable  7. Health maintenance . Mammogram at 24yo or earlier if indicated . Pap smears as indicated    NEXPLANON INSERTION  Risks/benefits/side effects of Nexplanon have been discussed and her questions have been answered.  Specifically, a failure rate of 05/998 has been reported, with an increased failure rate if pt takes Celina and/or antiseizure medicaitons.  She is aware of the common side effect of irregular bleeding, which the incidence of decreases over time. Signed copy of informed consent in chart.   Pertinent History Reviewed:   Reviewed past medical,surgical, social, obstetrical and family history.  Reviewed problem list,  medications and allergies. Objective Findings & Procedure:    Vitals:   04/11/20 1418  BP: 104/67  Pulse: 72  Weight: 123 lb (55.8 kg)  Height: _0  (1.6 m)  Body mass index is 21.79 kg/m.  Results for orders placed or performed in visit on 04/11/20 (from the past 24 hour(s))  POCT urine pregnancy   Collection Time: 04/11/20  2:22 PM  Result Value Ref Range   Preg Test, Ur Negative Negative     Time out was performed.  She is right-handed, so her left arm, approximately 10cm from the medial epicondyle and 3-5cm posterior to the sulcus, was cleansed with alcohol and anesthetized with 2cc of 2% Lidocaine.  The area was cleansed again with betadine and the Nexplanon was inserted per manufacturer's recommendations without difficulty.  3 steri-strips and pressure bandage were applied. The patient tolerated the procedure well.  Assessment & Plan:   1) Nexplanon insertion Pt was instructed to keep the area clean and dry, remove pressure bandage in 24 hours, and keep insertion site covered with the steri-strip for 3-5 days.  Back up contraception was recommended for 2 weeks.  She was given a card indicating date Nexplanon was inserted and date it needs to be removed. Follow-up PRN problems.   Meds: No orders of the defined types were placed in this encounter.   Follow-up: Return for in 2022-, Pap & Physical. Will do f/u of Nexplanon at this appointment.  Orders Placed This Encounter  Procedures  . POCT urine pregnancy    Myrtis Ser [facility] 04/11/2020 3:38 PM

## 2020-04-11 NOTE — Addendum Note (Signed)
Addended by: Colen Darling on: 04/11/2020 04:19 PM   Modules accepted: Orders

## 2020-05-30 ENCOUNTER — Ambulatory Visit (INDEPENDENT_AMBULATORY_CARE_PROVIDER_SITE_OTHER): Payer: 59 | Admitting: Family Medicine

## 2020-05-30 ENCOUNTER — Other Ambulatory Visit: Payer: Self-pay

## 2020-05-30 ENCOUNTER — Encounter: Payer: Self-pay | Admitting: Family Medicine

## 2020-05-30 VITALS — BP 110/72 | HR 114 | Temp 98.7°F | Resp 18

## 2020-05-30 DIAGNOSIS — J069 Acute upper respiratory infection, unspecified: Secondary | ICD-10-CM

## 2020-05-30 DIAGNOSIS — Z20822 Contact with and (suspected) exposure to covid-19: Secondary | ICD-10-CM | POA: Diagnosis not present

## 2020-05-30 MED ORDER — AZITHROMYCIN 250 MG PO TABS: ORAL_TABLET | ORAL | 0 refills | Status: DC

## 2020-05-30 NOTE — Progress Notes (Signed)
   Subjective:    Patient ID: Kelly Watts, female    DOB: 06/02/1995, 25 y.o.   MRN: [id number]  Patient presents for Illness (Bodyaches, sore throat, headache sinus pressure, started yesterday)  Patient here with body aches sore throat sinus pressure headache that started yesterday.  She is not had her COVID-19 vaccine.  Her family members had COVID-19 recently but she did not have symptoms when they had them.  No one else in her current household has any symptoms.  She did take ibuprofen that has helped with her sore throat and her body aches.  She has not had any fever.  No GI symptoms.  No significant cough.  He has been trying to drink but has poor appetite just feels fatigued.   Review Of Systems:  GEN- + fatigue, fever, weight loss,weakness, recent illness HEENT- denies eye drainage, change in vision, nasal discharge, CVS- denies chest pain, palpitations RESP- denies SOB, cough, wheeze ABD- denies N/V, change in stools, abd pain GU- denies dysuria, hematuria, dribbling, incontinence MSK- denies joint pain, +muscle aches, injury Neuro- denies headache, dizziness, syncope, seizure activity       Objective:    BP 110/72   Pulse (!) 114   Temp 98.7 F (37.1 C)   Resp 18   SpO2 98%  GEN- NAD, alert and oriented x3 HEENT- PERRL, EOMI, non injected sclera, pink conjunctiva, MMM, oropharynx mild injection,  No exudates TM clear bilat no effusion,  No  maxillary sinus tenderness, inflammed turbinates,    Neck- Supple, no LAD CVS-tachycardia regular rhythm no murmur RESP-CTAB abd -nabd, soft, Mild TTP left of umbilicus, no rebound, no guarding MSK TTP bilat paraspinals, Spine NT  EXT- No edema Pulses- Radial 2+          Assessment & Plan:      Problem List Items Addressed This Visit   None   Visit Diagnoses    Encounter for laboratory testing for COVID-19 virus    -  Primary   Relevant Orders   COVID19 and Influenza A & B   Viral URI       Viral illness in  the setting of pandemic will swab for COVID-19 and flu.  Continue ibuprofen which is helping.  I have given her azithromycin to have on hand in case her sinus worsens or she develops exudates in the back of the throat consistent with a bacterial pharyngitis.  Continue to hydrate herself and try to eat small amounts . Has had some tachycardia today which I associated with illness.  She has not had anything really to eat or drink today.  SHe can continue to breast-feed.  Advised symptoms to look out for in her infant daughter.    Relevant Medications   azithromycin (ZITHROMAX) 250 MG tablet      Note: This dictation was prepared with Dragon dictation along with smaller phrase technology. Any transcriptional errors that result from this process are unintentional.

## 2020-05-30 NOTE — Patient Instructions (Signed)
COVID/flu test done Keep hydrated  Try to eat small amounts Continue ibuprofen Antibiotics sen to pharmacy

## 2020-06-01 LAB — SARS-COVID-2 RNA(COVID19)AND INFLUENZA A&B, QUALITATIVE NAAT
FLU A: NOT DETECTED
FLU B: NOT DETECTED
SARS CoV2 RNA: DETECTED — AB

## 2020-06-26 ENCOUNTER — Telehealth: Payer: 59 | Admitting: Nurse Practitioner

## 2020-06-26 ENCOUNTER — Other Ambulatory Visit: Payer: Self-pay

## 2020-10-24 ENCOUNTER — Ambulatory Visit (INDEPENDENT_AMBULATORY_CARE_PROVIDER_SITE_OTHER): Payer: 59 | Admitting: Obstetrics & Gynecology

## 2020-10-24 ENCOUNTER — Encounter: Payer: Self-pay | Admitting: Obstetrics & Gynecology

## 2020-10-24 ENCOUNTER — Other Ambulatory Visit: Payer: Self-pay

## 2020-10-24 ENCOUNTER — Other Ambulatory Visit (HOSPITAL_COMMUNITY)
Admission: RE | Admit: 2020-10-24 | Discharge: 2020-10-24 | Disposition: A | Discharge status: Home / Self Care | Payer: 59 | Source: Ambulatory Visit | Attending: Obstetrics & Gynecology | Admitting: Obstetrics & Gynecology

## 2020-10-24 VITALS — BP 108/66 | HR 68 | Ht 63.0 in | Wt 127.2 lb

## 2020-10-24 DIAGNOSIS — N939 Abnormal uterine and vaginal bleeding, unspecified: Secondary | ICD-10-CM | POA: Diagnosis not present

## 2020-10-24 DIAGNOSIS — Z124 Encounter for screening for malignant neoplasm of cervix: Secondary | ICD-10-CM

## 2020-10-24 NOTE — Progress Notes (Signed)
GYN VISIT Patient name: Kelly Watts MRN [id number]  Date of birth: 17-Jan-1996 Chief Complaint:   Menorrhagia (Having heavy periods with nexplanon)  History of Present Illness:   Kelly Watts is a 25 y.o. [phone] female being seen today for irregular bleeding.     AUB: Started about 27mo postpartum (around Jan).  Nexplanon was placed for contraception in Dec 2021.  Notes that her periods are about 2 wks at a time. Typically changing pad/tampon every hour or so.  No passage of clots.  At night must wear pads due to continued bleeding. Some mild dysmenorrhea.  Then this past mo in May had a period that lasted x 3 weeks- with moderate bleeding the entire time.  Of note, she has had several Nexplanons in the past, each time, she has noted irregular bleeding and would then have device removed and soon after would become pregnant.  She does not desire another pregnancy yet as her baby is only 101mos  Notes some dizziness with sudden movements.  No headache, blurry vision, no syncopal event   Patient's last menstrual period was 09/29/2020.  Depression screen [facility] 2/9 12/05/2019 08/24/2019 09/08/2018 01/20/2017 08/25/2016  Decreased Interest 0 0 0 0 0  Down, Depressed, Hopeless 0 0 0 0 0  PHQ - 2 Score 0 0 0 0 0  Altered sleeping 0 0 - 0 -  Tired, decreased energy 0 1 - 0 -  Change in appetite 0 0 - 0 -  Feeling bad or failure about yourself  0 0 - 0 -  Trouble concentrating 0 0 - 0 -  Moving slowly or fidgety/restless 0 0 - 0 -  Suicidal thoughts 0 0 - 0 -  PHQ-9 Score 0 1 - 0 -  Difficult doing work/chores - Not difficult at all - Not difficult at all -     Review of Systems:   Pertinent items are noted in HPI Denies fever/chills, dizziness, headaches, visual disturbances, fatigue, shortness of breath, chest pain, abdominal pain, vomiting, bowel movements, urination, or intercourse unless otherwise stated above.  Pertinent History Reviewed:  Reviewed past medical,surgical, social,  obstetrical and family history.  Reviewed problem list, medications and allergies. Physical Assessment:   Vitals:   10/24/20 0854  BP: 108/66  Pulse: 68  Weight: 127 lb 3.2 oz (57.7 kg)  Height: 5\' 3"  (1.6 m)  Body mass index is 22.53 kg/m.       Physical Examination:   General appearance: alert, well appearing, and in no distress  Psych: mood appropriate, normal affect  Skin: warm & dry   Cardiovascular: normal heart rate noted  Respiratory: normal respiratory effort, no distress  Abdomen: soft, non-tender   Pelvic: normal external genitalia, vulva, vagina, cervix, uterus and adnexa, pap collected  Extremities: no edema   Chaperone:    Assessment & Plan:  1) Abnormal uterine bleeding -suspect side effect of Nexplanon- discussed options -plan for 66mos trial of OCP along with Nexplanon- if continue bleeding or no improvement let 3mo know -discussed alternative contraceptive options in case she desires to change her mind -will also r/o underlying anemia- CBC, iron panel ordered  OCP risk assessment: Pt denies personal history of VTE, stroke or heart attack.  Denies personal h/o breast cancer.  Pt is either a non-smoker or smoker under the age of 25yo.  Does note h/o headaches with change in vision- unclear if specifically migraines.  Denies headache in "quite a while" and previously taken OCPs without issues.  2) Cervical cancer screening -pap collected -declined STI screening- same partner x 67yrs   Orders Placed This Encounter  Procedures   CBC   Iron, TIBC and Ferritin Panel     Return if symptoms worsen or fail to improve.   Myna Hidalgo, DO Attending Obstetrician & Gynecologist, [facility] for [employer], [facility]

## 2020-10-25 LAB — IRON,TIBC AND FERRITIN PANEL
Ferritin: 20 ng/mL (ref 15–150)
Iron Saturation: 12 % — ABNORMAL LOW (ref 15–55)
Iron: 49 ug/dL (ref 27–159)
Total Iron Binding Capacity: 403 ug/dL (ref 250–450)
UIBC: 354 ug/dL (ref 131–425)

## 2020-10-25 LAB — CBC
Hematocrit: 43.1 % (ref 34.0–46.6)
Hemoglobin: 14.5 g/dL (ref 11.1–15.9)
MCH: 29.7 pg (ref 26.6–33.0)
MCHC: 33.6 g/dL (ref 31.5–35.7)
MCV: 88 fL (ref 79–97)
Platelets: 355 10*3/uL (ref 150–450)
RBC: 4.88 x10E6/uL (ref 3.77–5.28)
RDW: 12.3 % (ref 11.7–15.4)
WBC: 7.8 10*3/uL (ref 3.4–10.8)

## 2020-10-25 LAB — CYTOLOGY - PAP
Comment: NEGATIVE
Diagnosis: NEGATIVE
High risk HPV: NEGATIVE

## 2021-02-26 ENCOUNTER — Other Ambulatory Visit: Payer: Self-pay

## 2021-02-26 ENCOUNTER — Ambulatory Visit
Admission: EM | Admit: 2021-02-26 | Discharge: 2021-02-26 | Disposition: A | Discharge status: Home / Self Care | Payer: 59 | Attending: Emergency Medicine | Admitting: Emergency Medicine

## 2021-02-26 ENCOUNTER — Ambulatory Visit (INDEPENDENT_AMBULATORY_CARE_PROVIDER_SITE_OTHER): Payer: 59

## 2021-02-26 ENCOUNTER — Encounter: Payer: Self-pay | Admitting: Emergency Medicine

## 2021-02-26 ENCOUNTER — Ambulatory Visit: Payer: Self-pay

## 2021-02-26 DIAGNOSIS — S93492A Sprain of other ligament of left ankle, initial encounter: Secondary | ICD-10-CM | POA: Diagnosis not present

## 2021-02-26 DIAGNOSIS — M25572 Pain in left ankle and joints of left foot: Secondary | ICD-10-CM | POA: Diagnosis not present

## 2021-02-26 MED ORDER — IBUPROFEN 600 MG PO TABS: 600.0000 mg | ORAL_TABLET | Freq: Four times a day (QID) | ORAL | 0 refills | Status: DC | PRN

## 2021-02-26 NOTE — Discharge Instructions (Addendum)
Ice, elevation, wear the ASO at all times for the next several weeks, then as needed for comfort.  600 mg of ibuprofen combined with 1000 mg of Tylenol together 3-4 times a day as needed for pain.  Follow-up with Dr. Romeo Apple with [facility] in a week if not getting significantly better.  You may need physical therapy.

## 2021-02-26 NOTE — ED Triage Notes (Signed)
Rolled left ankle on Sunday.  States pain has gotten worse.

## 2021-02-26 NOTE — ED Provider Notes (Signed)
HPI  SUBJECTIVE:  Kelly Watts is a 25 y.o. female who presents with 2 days of lateral ankle pain, numbness and tingling in her foot after twisting it.  She is not sure if she inverted or everted it.  She denies bruising, swelling, erythema, injury to the foot.  She reports limitation of motion secondary to the pain.  She has been taking Advil 800 mg every 6 hours with improvement in her symptoms.  Symptoms are worse with weightbearing and rolling her ankle outward.  Past medical history negative for left ankle injury.  LMP: 2 weeks ago.  Denies the possibility being pregnant.  MBW:GYKZLD, Velna Hatchet, MD   Past Medical History:  Diagnosis Date   Angio-edema    Asthma    Eczema    Encounter for Nexplanon removal 11/16/2015   History of kidney stones    History of kidney stones    Kidney infection    Nexplanon insertion 11/02/2012   Inserted left arm 11/02/12   Patient underweight    Recurrent upper respiratory infection (URI)    Urticaria     Past Surgical History:  Procedure Laterality Date   EAR TUBE REMOVAL     EXTRACORPOREAL SHOCK WAVE LITHOTRIPSY Left 10/30/2016   Procedure: LEFT EXTRACORPOREAL SHOCK WAVE LITHOTRIPSY (ESWL);  Surgeon: Crist Fat, MD;  Location: [facility];  Service: Urology;  Laterality: Left;   TYMPANOSTOMY TUBE PLACEMENT     WISDOM TOOTH EXTRACTION      Family History  Problem Relation Age of Onset   Allergic rhinitis Father    Asthma Mother    Allergic rhinitis Mother    Coronary artery disease Paternal Grandfather    Cancer Paternal Grandmother        breast   Diabetes Maternal Grandmother    Thyroid disease Maternal Grandmother    Heart disease Maternal Grandmother    Melanoma Maternal Grandfather    Allergic rhinitis Brother    Allergic rhinitis Brother    Angioedema Neg Hx    Atopy Neg Hx    Eczema Neg Hx    Immunodeficiency Neg Hx    Urticaria Neg Hx     Social History   Tobacco Use   Smoking status: Former    Packs/day: 0.25     Years: 3.00    Pack years: 0.75    Types: Cigarettes    Quit date: 01/30/2016    Years since quitting: 5.0   Smokeless tobacco: Never  Vaping Use   Vaping Use: Never used  Substance Use Topics   Alcohol use: No    Comment: occ   Drug use: No    No current facility-administered medications for this encounter.  Current Outpatient Medications:    ibuprofen (ADVIL) 600 MG tablet, Take 1 tablet (600 mg total) by mouth every 6 (six) hours as needed., Disp: 30 tablet, Rfl: 0  Allergies  Allergen Reactions   Penicillins Hives    Has patient had a PCN reaction causing immediate rash, facial/tongue/throat swelling, SOB or lightheadedness with hypotension: Yes Has patient had a PCN reaction causing severe rash involving mucus membranes or skin necrosis: No Has patient had a PCN reaction that required hospitalization: No Has patient had a PCN reaction occurring within the last 10 years: No If all of the above answers are "NO", then may proceed with Cephalosporin use.    Sulfa Antibiotics Hives     ROS  As noted in HPI.   Physical Exam  BP 115/71 (BP Location: Right Arm)  Pulse 89   Temp 98.2 F (36.8 C) (Oral)   Resp 18   LMP 02/12/2021 (Approximate)   SpO2 98%   Constitutional: Well developed, well nourished, no acute distress Eyes:  EOMI, conjunctiva normal bilaterally HENT: Normocephalic, atraumatic,mucus membranes moist Respiratory: Normal inspiratory effort Cardiovascular: Normal rate GI: nondistended skin: No rash, skin intact Musculoskeletal:  L Ankle Proximal fibula NT , Distal fibula tender, Medial malleolus NT ,  Deltoid ligaments medially NT,  ATFL  tender, calcaneofibular ligament  tender, posterior tablofibular ligament tender  ,  Achilles NT, calcaneus  NT,  Proximal 5th metatarsal NT, Midfoot NT, distal NVI with baseline sensation / motor to foot with DP 2+. Pain  with dorsiflexion/plantar flexion. Pain  with inversion/eversion. - bruising. - squeeze test  Ant  drawer test stable. Pt able to bear weight in dept.   Neurologic: Alert & oriented x 3, no focal neuro deficits Psychiatric: Speech and behavior appropriate   ED Course   Medications - No data to display  Orders Placed This Encounter  Procedures   DG Ankle Complete Left    Standing Status:   Standing    Number of Occurrences:   1    Order Specific Question:   Reason for Exam (SYMPTOM  OR DIAGNOSIS REQUIRED)    Answer:   rolled left ankle on Sunday   Apply ASO ankle    Standing Status:   Standing    Number of Occurrences:   1    Order Specific Question:   Laterality    Answer:   Left    No results found for this or any previous visit (from the past 24 hour(s)). DG Ankle Complete Left  Result Date: 02/26/2021 CLINICAL DATA:  Rolled LEFT ankle on Sunday, worsening pain EXAM: LEFT ANKLE COMPLETE - 3+ VIEW COMPARISON:  None FINDINGS: Osseous mineralization normal. Joint spaces preserved. No acute fracture, dislocation, or bone destruction. IMPRESSION: No acute osseous abnormalities. Electronically Signed   By: Ulyses Southward M.D.   On: 02/26/2021 13:02    ED Clinical Impression  1. Sprain of anterior talofibular ligament of left ankle, initial encounter      ED Assessment/Plan  Reviewed imaging independently.  Normal x-ray.  See radiology report for full details.  Patient with a left ankle sprain.  She is neurovascularly intact.  Home in ASO, ice, elevate, Tylenol/ibuprofen, rest is much as possible.  Follow-up with Dr. Romeo Apple with [facility].  Discussed with her that she may need physical therapy.  Discussed imaging, MDM, treatment plan, and plan for follow-up with patient.  patient agrees with plan.   Meds ordered this encounter  Medications   ibuprofen (ADVIL) 600 MG tablet    Sig: Take 1 tablet (600 mg total) by mouth every 6 (six) hours as needed.    Dispense:  30 tablet    Refill:  0      *This clinic note was created using Scientist, clinical (histocompatibility and immunogenetics).  Therefore, there may be occasional mistakes despite careful proofreading.  ?    Domenick Gong, MD 02/27/21 [phone]

## 2021-04-09 ENCOUNTER — Encounter: Payer: Self-pay | Admitting: Adult Health

## 2021-04-09 ENCOUNTER — Other Ambulatory Visit: Payer: Self-pay

## 2021-04-09 ENCOUNTER — Ambulatory Visit (INDEPENDENT_AMBULATORY_CARE_PROVIDER_SITE_OTHER): Payer: 59 | Admitting: Adult Health

## 2021-04-09 VITALS — BP 113/74 | HR 92 | Ht 63.0 in | Wt 138.0 lb

## 2021-04-09 DIAGNOSIS — Z3046 Encounter for surveillance of implantable subdermal contraceptive: Secondary | ICD-10-CM | POA: Diagnosis not present

## 2021-04-09 DIAGNOSIS — Z3009 Encounter for other general counseling and advice on contraception: Secondary | ICD-10-CM | POA: Insufficient documentation

## 2021-04-09 NOTE — Patient Instructions (Signed)
Use condoms, keep clean and dry x 24 hours, no heavy lifting, keep steri strips on x 72 hours, Keep pressure dressing on x 24 hours. Follow up prn problems.  

## 2021-04-09 NOTE — Progress Notes (Signed)
  Subjective:     Patient ID: Kelly Watts, female   DOB: 24-Jul-1995, 25 y.o.   MRN: [id number]  HPI Kelly Watts is a 25 year old white female,married, G3p2102 in for nexplanon removal, has had irregular bleeding. Lab Results  Component Value Date   DIAGPAP  10/24/2020    - Negative for intraepithelial lesion or malignancy (NILM)   HPVHIGH Negative 10/24/2020    Review of Systems Irregular bleeding with nexplanon, and nothing helped to stop it Reviewed past medical,surgical, social and family history. Reviewed medications and allergies.     Objective:   Physical Exam BP 113/74 (BP Location: Left Arm, Patient Position: Sitting, Cuff Size: Normal)   Pulse 92   Ht 5\' 3"  (1.6 m)   Wt 138 lb (62.6 kg)   Breastfeeding No   BMI 24.45 kg/m  Consent signed and time out called.   Left arm cleansed with betadine, and injected with 1.5 cc 2% lidocaine and waited til numb.Under sterile technique a #11 blade was used to make small vertical incision, and a curved forceps was used to easily remove rod. Steri strips applied. Pressure dressing applied.   Upstream - 04/09/21 1059       Pregnancy Intention Screening   Does the patient want to become pregnant in the next year? No    Does the patient's partner want to become pregnant in the next year? No    Would the patient like to discuss contraceptive options today? Yes      Contraception Wrap Up   Current Method Hormonal Implant    End Method Female Condom    Contraception Counseling Provided Yes             Assessment:     1. Encounter for Nexplanon removal Use condoms, keep clean and dry x 24 hours, no heavy lifting, keep steri strips on x 72 hours, Keep pressure dressing on x 24 hours. Follow up prn problems.   2. General counseling and advice for contraceptive management Discussed options, and will use condoms for now    Plan:     Follow up prn

## 2021-06-06 ENCOUNTER — Telehealth: Payer: 59 | Admitting: Physician Assistant

## 2021-06-06 DIAGNOSIS — B9689 Other specified bacterial agents as the cause of diseases classified elsewhere: Secondary | ICD-10-CM

## 2021-06-06 DIAGNOSIS — J019 Acute sinusitis, unspecified: Secondary | ICD-10-CM | POA: Diagnosis not present

## 2021-06-06 MED ORDER — DOXYCYCLINE HYCLATE 100 MG PO TABS: 100.0000 mg | ORAL_TABLET | Freq: Two times a day (BID) | ORAL | 0 refills | Status: DC

## 2021-06-06 NOTE — Progress Notes (Signed)
I have spent 5 minutes in review of e-visit questionnaire, review and updating patient chart, medical decision making and response to patient.   Primo Innis Cody Vale Mousseau, PA-C    

## 2021-06-06 NOTE — Progress Notes (Signed)

## 2021-06-19 ENCOUNTER — Ambulatory Visit (INDEPENDENT_AMBULATORY_CARE_PROVIDER_SITE_OTHER): Payer: Managed Care, Other (non HMO) | Admitting: *Deleted

## 2021-06-19 ENCOUNTER — Encounter: Payer: Self-pay | Admitting: *Deleted

## 2021-06-19 ENCOUNTER — Other Ambulatory Visit: Payer: Self-pay

## 2021-06-19 VITALS — BP 91/55 | HR 76

## 2021-06-19 DIAGNOSIS — N926 Irregular menstruation, unspecified: Secondary | ICD-10-CM | POA: Diagnosis not present

## 2021-06-19 DIAGNOSIS — Z3201 Encounter for pregnancy test, result positive: Secondary | ICD-10-CM

## 2021-06-19 LAB — POCT URINE PREGNANCY: Preg Test, Ur: POSITIVE — AB

## 2021-06-19 NOTE — Progress Notes (Signed)
° °  NURSE VISIT- PREGNANCY CONFIRMATION   SUBJECTIVE:  Kelly Watts is a 26 y.o. [phone] female at [redacted]w[redacted]d by certain LMP of Patient's last menstrual period was 05/16/2021 (exact date). Here for pregnancy confirmation.  Home pregnancy test: positive x 1   She reports no complaints.  She is taking prenatal vitamins.    OBJECTIVE:  BP (!) 91/55 (BP Location: Left Arm, Patient Position: Sitting, Cuff Size: Normal)    Pulse 76    LMP 05/16/2021 (Exact Date)    Breastfeeding No   Appears well, in no apparent distress  Results for orders placed or performed in visit on 06/19/21 (from the past 24 hour(s))  POCT urine pregnancy   Collection Time: 06/19/21 11:28 AM  Result Value Ref Range   Preg Test, Ur Positive (A) Negative    ASSESSMENT: Positive pregnancy test, [redacted]w[redacted]d by LMP    PLAN: Schedule for dating ultrasound in 4 weeks Prenatal vitamins: continue   Nausea medicines: not currently needed   OB packet given: Yes  Annamarie Dawley  06/19/2021 11:29 AM

## 2021-06-22 ENCOUNTER — Telehealth: Payer: Managed Care, Other (non HMO) | Admitting: Nurse Practitioner

## 2021-06-22 DIAGNOSIS — H1033 Unspecified acute conjunctivitis, bilateral: Secondary | ICD-10-CM

## 2021-06-22 MED ORDER — OFLOXACIN 0.3 % OP SOLN: 1.0000 [drp] | Freq: Four times a day (QID) | OPHTHALMIC | 0 refills | Status: DC

## 2021-06-22 NOTE — Progress Notes (Signed)

## 2021-07-09 ENCOUNTER — Other Ambulatory Visit: Payer: Self-pay | Admitting: Obstetrics & Gynecology

## 2021-07-09 DIAGNOSIS — O3680X0 Pregnancy with inconclusive fetal viability, not applicable or unspecified: Secondary | ICD-10-CM

## 2021-07-10 ENCOUNTER — Other Ambulatory Visit: Payer: Self-pay

## 2021-07-10 ENCOUNTER — Ambulatory Visit (INDEPENDENT_AMBULATORY_CARE_PROVIDER_SITE_OTHER): Payer: Managed Care, Other (non HMO)

## 2021-07-10 ENCOUNTER — Other Ambulatory Visit: Payer: Managed Care, Other (non HMO)

## 2021-07-10 DIAGNOSIS — Z3A01 Less than 8 weeks gestation of pregnancy: Secondary | ICD-10-CM

## 2021-07-10 DIAGNOSIS — O200 Threatened abortion: Secondary | ICD-10-CM

## 2021-07-10 DIAGNOSIS — O3680X0 Pregnancy with inconclusive fetal viability, not applicable or unspecified: Secondary | ICD-10-CM

## 2021-07-10 NOTE — Progress Notes (Addendum)
[place] 7+6 wks,single IUP with YS,no fetal heart tones visualized,subchorionic hemorrhage 3.4 x 1.5 x 1.2 cm,simple left corpus luteal cyst 3.1 x 2.8 x 2 cm,normal right ovary,CRL 4.9 mm,scheduled F/U ultrasound with labs today per Cyril Mourning ? ? ?

## 2021-07-11 ENCOUNTER — Other Ambulatory Visit: Payer: Self-pay | Admitting: Adult Health

## 2021-07-11 ENCOUNTER — Telehealth: Payer: Self-pay | Admitting: *Deleted

## 2021-07-11 DIAGNOSIS — Z3A01 Less than 8 weeks gestation of pregnancy: Secondary | ICD-10-CM

## 2021-07-11 LAB — BETA HCG QUANT (REF LAB): hCG Quant: 82090 m[IU]/mL

## 2021-07-11 NOTE — Telephone Encounter (Signed)
Pt had an Korea yesterday. Korea didn't pick up a heart beat. Started spotting today, just with wiping. Pt is not having any pain. Subchorionic hemorrhage was seen on [place]. Pt was advised she can have spotting/bleeding with that. Pt is scheduled to follow up on 3/22 for repeat US. Pt was advised if bleeding got heavy or if she started having pain to let us know. Pt voiced understanding. JSY ?

## 2021-07-13 LAB — BETA HCG QUANT (REF LAB): hCG Quant: 85004 m[IU]/mL

## 2021-07-23 ENCOUNTER — Other Ambulatory Visit: Payer: Self-pay | Admitting: Adult Health

## 2021-07-23 DIAGNOSIS — O3680X0 Pregnancy with inconclusive fetal viability, not applicable or unspecified: Secondary | ICD-10-CM

## 2021-07-24 ENCOUNTER — Ambulatory Visit (INDEPENDENT_AMBULATORY_CARE_PROVIDER_SITE_OTHER): Payer: Managed Care, Other (non HMO)

## 2021-07-24 ENCOUNTER — Other Ambulatory Visit: Payer: Self-pay | Admitting: Adult Health

## 2021-07-24 ENCOUNTER — Encounter: Payer: Self-pay | Admitting: Obstetrics & Gynecology

## 2021-07-24 ENCOUNTER — Other Ambulatory Visit: Payer: Self-pay

## 2021-07-24 ENCOUNTER — Ambulatory Visit (INDEPENDENT_AMBULATORY_CARE_PROVIDER_SITE_OTHER): Payer: Managed Care, Other (non HMO) | Admitting: Obstetrics & Gynecology

## 2021-07-24 VITALS — BP 120/65 | HR 77 | Wt 134.8 lb

## 2021-07-24 DIAGNOSIS — O039 Complete or unspecified spontaneous abortion without complication: Secondary | ICD-10-CM

## 2021-07-24 DIAGNOSIS — O3680X0 Pregnancy with inconclusive fetal viability, not applicable or unspecified: Secondary | ICD-10-CM

## 2021-07-24 DIAGNOSIS — Z3A09 9 weeks gestation of pregnancy: Secondary | ICD-10-CM

## 2021-07-24 MED ORDER — MISOPROSTOL 200 MCG PO TABS: ORAL_TABLET | ORAL | 0 refills | Status: DC

## 2021-07-24 NOTE — Progress Notes (Signed)
? ?  GYN VISIT ?Patient name: Kelly Watts MRN [id number]  Date of birth: 09-Dec-1995 ?Chief Complaint:   ?Pregnancy Ultrasound ? ?History of Present Illness:   ?Kelly Watts is a 26 y.o. [phone] @ [redacted]w[redacted]d female being seen today for follow up US.   ? ?Unfortunately today's [place] is confirmatory for miscarriage. ?She denies vaginal bleeding, pelvic or abdominal pain.  Reports no acute complaints.    ? ?Patient's last menstrual period was 05/16/2021 (exact date). ? ? ?  12/05/2019  ?  9:16 AM 08/24/2019  ?  3:37 PM 09/08/2018  ?  8:49 AM 01/20/2017  ? 11:16 AM 08/25/2016  ? 10:36 AM  ?Depression screen PHQ 2/9  ?Decreased Interest 0 0 0 0 0  ?Down, Depressed, Hopeless 0 0 0 0 0  ?PHQ - 2 Score 0 0 0 0 0  ?Altered sleeping 0 0  0   ?Tired, decreased energy 0 1  0   ?Change in appetite 0 0  0   ?Feeling bad or failure about yourself  0 0  0   ?Trouble concentrating 0 0  0   ?Moving slowly or fidgety/restless 0 0  0   ?Suicidal thoughts 0 0  0   ?PHQ-9 Score 0 1  0   ?Difficult doing work/chores  Not difficult at all  Not difficult at all   ? ? ? ?Review of Systems:   ?Pertinent items are noted in HPI ?Denies fever/chills, dizziness, headaches, visual disturbances, fatigue, shortness of breath, chest pain, abdominal pain, vomiting, bowel movements, urination, or intercourse unless otherwise stated above.  ?Pertinent History Reviewed:  ?Reviewed past medical,surgical, social, obstetrical and family history.  ?Reviewed problem list, medications and allergies. ?Physical Assessment:  ? ?Vitals:  ? 07/24/21 1243  ?BP: 120/65  ?Pulse: 77  ?Weight: 134 lb 12.8 oz (61.1 kg)  ?Body mass index is 23.88 kg/m?. ? ?     Physical Examination:  ? General appearance: alert, well appearing, and in no distress ? Psych: appropriately tearful ? Skin: warm & dry  ? Cardiovascular: normal heart rate noted ? Respiratory: normal respiratory effort, no distress ? Abdomen: soft, non-tender  ? Pelvic: examination not indicated ? Extremities: no edema, no  calf tenderness ? ?[place] 5+6 wks single IUP,no FHT, CRL 4.4 mm,subchorionic hemorrhage 5.2 x .7 x 3.4 cm,GS 37.4 mm,normal ovaries ? ?Chaperone: N/A   ? ?Assessment & Plan:  ?1) Miscarriage ?-discussed conservative, medical and surgical intervention ?-reviewed pros/cons of each option, pt agreeable to medication ?-HCG today ?-Rx sent in, f/u in 1 wk ? ?[]  plan for RhoGAM next visit ? ? ?Return in about 1 week (around 07/31/2021) for miscarriage follow up with MD only. ? ? ?08/02/2021, DO ?Attending Obstetrician & Gynecologist, Faculty Practice ?[facility] ? ? ? ?

## 2021-07-24 NOTE — Progress Notes (Signed)
[place] 5+6 wks single IUP,no FHT, CRL 4.4 mm,subchorionic hemorrhage 5.2 x .7 x 3.4 cm,GS 37.4 mm,normal ovaries ?

## 2021-07-25 LAB — BETA HCG QUANT (REF LAB): hCG Quant: 86984 m[IU]/mL

## 2021-07-26 ENCOUNTER — Telehealth: Payer: Self-pay | Admitting: Obstetrics & Gynecology

## 2021-07-26 NOTE — Telephone Encounter (Signed)
Returned pt's call, two identifiers used. Informed pt that Dr Charlotta Newton stated that she didn't need to make an extra trip to get the Rhogam injection and she can get it at her next visit, which is scheduled for 3/30. Pt confirmed understanding. ?

## 2021-07-26 NOTE — Telephone Encounter (Signed)
Patient wants to know if she needs to come before next Thursday to get her rhogam shot. Please advise.  ?

## 2021-08-01 ENCOUNTER — Ambulatory Visit (INDEPENDENT_AMBULATORY_CARE_PROVIDER_SITE_OTHER): Payer: Managed Care, Other (non HMO) | Admitting: Adult Health

## 2021-08-01 ENCOUNTER — Encounter: Payer: Self-pay | Admitting: Adult Health

## 2021-08-01 VITALS — BP 103/65 | HR 90 | Ht 64.0 in | Wt 132.0 lb

## 2021-08-01 DIAGNOSIS — O039 Complete or unspecified spontaneous abortion without complication: Secondary | ICD-10-CM | POA: Diagnosis not present

## 2021-08-01 NOTE — Progress Notes (Signed)
?  Subjective:  ?  ? Patient ID: Kelly Watts, female   DOB: 08-Jan-1996, 26 y.o.   MRN: [id number] ? ?HPI ?Kelly Watts is a 26 year old white female, married, back in follow up on miscarriage, she took Cytotec 07/26/21 and again 07/28/21, passed some clots and bleeding was not heavy, passed 2 tablets, un dissolved, and is still spotting. She received rhogam in office today,she is O-. ?Lab Results  ?Component Value Date  ? DIAGPAP  10/24/2020  ?  - Negative for intraepithelial lesion or malignancy (NILM)  ? HPVHIGH Negative 10/24/2020  ?  ? ?Review of Systems ?Spotting ?Still has nausea in am ? ?Reviewed past medical,surgical, social and family history. Reviewed medications and allergies.  ?   ?Objective:  ? Physical Exam ?BP 103/65 (BP Location: Right Arm, Patient Position: Sitting, Cuff Size: Normal)   Pulse 90   Ht 5\' 4"  (1.626 m)   Wt 132 lb (59.9 kg)   LMP 05/16/2021 (Exact Date)   Breastfeeding No   BMI 22.66 kg/m?   ?  Skin warm and dry. Lungs: clear to ausculation bilaterally. Cardiovascular: regular rate and rhythm.  ? Upstream - 08/01/21 1207   ? ?  ? Pregnancy Intention Screening  ? Does the patient want to become pregnant in the next year? Unsure   ? Does the patient's partner want to become pregnant in the next year? Unsure   ? Would the patient like to discuss contraceptive options today? No   ?  ? Contraception Wrap Up  ? Current Method Abstinence   ? End Method Abstinence   ? Contraception Counseling Provided No   ? ?  ?  ? ?  ?  ?Assessment:  ?   ?1. Miscarriage ?Will check QHCG, (on 07/24/21 it was [phone]) to see if dropping, if not may need follow up 07/26/21  ?- Beta hCG quant (ref lab)  ?   ?Plan:  ?  No sex til no spotting  ?Follow up in 2 weeks  ?   ?

## 2021-08-02 ENCOUNTER — Ambulatory Visit (INDEPENDENT_AMBULATORY_CARE_PROVIDER_SITE_OTHER): Payer: Managed Care, Other (non HMO) | Admitting: Obstetrics & Gynecology

## 2021-08-02 ENCOUNTER — Other Ambulatory Visit: Payer: Self-pay | Admitting: Adult Health

## 2021-08-02 ENCOUNTER — Ambulatory Visit (INDEPENDENT_AMBULATORY_CARE_PROVIDER_SITE_OTHER): Payer: Managed Care, Other (non HMO)

## 2021-08-02 ENCOUNTER — Telehealth: Payer: Self-pay | Admitting: Adult Health

## 2021-08-02 ENCOUNTER — Encounter: Payer: Self-pay | Admitting: Obstetrics & Gynecology

## 2021-08-02 DIAGNOSIS — Z3A11 11 weeks gestation of pregnancy: Secondary | ICD-10-CM

## 2021-08-02 DIAGNOSIS — O039 Complete or unspecified spontaneous abortion without complication: Secondary | ICD-10-CM | POA: Diagnosis not present

## 2021-08-02 DIAGNOSIS — O3680X0 Pregnancy with inconclusive fetal viability, not applicable or unspecified: Secondary | ICD-10-CM | POA: Diagnosis not present

## 2021-08-02 LAB — BETA HCG QUANT (REF LAB): hCG Quant: 48341 m[IU]/mL

## 2021-08-02 NOTE — Progress Notes (Signed)
Follow up appointment for results: ?Repeat sonogram for pregnancy loss s/p cytotec ? ?Chief Complaint  ?Patient presents with  ? Miscarriage  ?  ? ? ? ?US OB Comp Less 14 Wks ? ?Result Date: 08/02/2021 ?Table formatting from the original result was not included. Images from the original result were not included.  ..an [employer] of Ultrasound Medicine Technical sales engineer) accredited practice Center for [facility] [address] [place] [address] Ordering Provider: Adline Potter, NP FOLLOW UP SONOGRAM Kelly Watts is in the office for a follow up sonogram to R/O retained products of conception. She is a 26 y.o. year old G70P2102 with Estimated Date of Delivery: 02/20/2022. by LMP now at  11+[redacted] weeks gestation. Thus far the pregnancy has been complicated by no fetal heart tones on prior ultrasound,QHCG not dropping appropriately. GESTATION: SINGLETON FETAL ACTIVITY:          Heart rate         No FHT          CERVIX: Appears closed ADNEXA: normal right ovary,simple left ovarian cyst 3.2 x 1.8 x 2.7 cm GESTATIONAL AGE AND  BIOMETRICS: Gestational criteria: Estimated Date of Delivery: 02/20/2022 by LMP now at 11+1 wks Previous Scans:0 GESTATIONAL SAC           35.2 mm         8+5 weeks CROWN RUMP LENGTH           3.35 mm         5+6 weeks                                                                      AVERAGE EGA(BY THIS SCAN):  5+6 weeks                                              SUSPECTED ABNORMALITIES:  yes QUALITY OF SCAN: satisfactory TECHNICIAN COMMENTS: [place] 5+6 wks,single IUP,no FHT,CRL 3.35 mm,GS 35.2 mm,normal right ovary,simple left ovarian cyst 3.2 x 1.8 x 2.7 cm,Dr Despina Hidden discussed results with patient A copy of this report including all images has been saved and backed up to a second source for retrieval if needed. All measures and details of the anatomical scan, placentation, fluid volume and pelvic anatomy are contained in that report. Amber Flora Lipps 08/02/2021 12:36 PM  Clinical Impression and recommendations: I have reviewed the sonogram results above, combined with the patient's current clinical course, below are my impressions and any appropriate recommendations for management based on the sonographic findings. Non viable IUP, no cardiac activity with embryonic retention [id number] Estimated Date of Delivery: None noted. Normal general sonographic findings otherwise Lazaro Arms 08/02/2021 1:06 PM   ? ? ? ?MEDS ordered this encounter: ?No orders of the defined types were placed in this encounter. ? ? ?Orders for this encounter: ?No orders of the defined types were placed in this encounter. ? ? ?Impression + Management Plan ?  ICD-10-CM   ?1. Spontaneous pregnancy loss, with embryonic retention(missed AB)  O03.9 CBC  ?  Type and screen  ? Patient is S/P cytotec  with continued retention of the embryo.  Opts for surgical management.  Put on Dr Lawana Chamberszan's schedule per pt request  ?  ? Discussed with patient the options and she elects to proceed with D&C, requests DR Charlotta Newtonzan for the surgery. ?I scheduled it and placed orders ? ?Follow Up: ?Return if symptoms worsen or fail to improve. ? ? ? ? All questions were answered. ? ?Past Medical History:  ?Diagnosis Date  ? Angio-edema   ? Asthma   ? Eczema   ? Encounter for Nexplanon removal 11/16/2015  ? History of kidney stones   ? History of kidney stones   ? Kidney infection   ? Nexplanon insertion 11/02/2012  ? Inserted left arm 11/02/12  ? Patient underweight   ? Recurrent upper respiratory infection (URI)   ? Urticaria   ? ? ?Past Surgical History:  ?Procedure Laterality Date  ? EAR TUBE REMOVAL    ? EXTRACORPOREAL SHOCK WAVE LITHOTRIPSY Left 10/30/2016  ? Procedure: LEFT EXTRACORPOREAL SHOCK WAVE LITHOTRIPSY (ESWL);  Surgeon: Crist FatHerrick, Benjamin W, MD;  Location: [facility];  Service: Urology;  Laterality: Left;  ? TYMPANOSTOMY TUBE PLACEMENT    ? WISDOM TOOTH EXTRACTION    ? ? ?OB History   ? ? Gravida  ?4  ? Para  ?3  ? Term  ?2  ? Preterm  ?1  ? AB  ?    ? Living  ?2  ?  ? ? SAB  ?   ? IAB  ?   ? Ectopic  ?   ? Multiple  ?0  ? Live Births  ?2  ?   ?  ?  ? ? ?Allergies  ?Allergen Reactions  ? Penicillins Hives  ?  Has patient had a PCN reaction causing immediate rash, facial/tongue/throat swelling, SOB or lightheadedness with hypotension: Yes ?Has patient had a PCN reaction causing severe rash involving mucus membranes or skin necrosis: No ?Has patient had a PCN reaction that required hospitalization: No ?Has patient had a PCN reaction occurring within the last 10 years: No ?If all of the above answers are "NO", then may proceed with Cephalosporin use. ?  ? Sulfa Antibiotics Hives  ? ? ?Social History  ? ?Socioeconomic History  ? Marital status: Married  ?  Spouse name: Illene RegulusJoshua Weisenberger  ? Number of children: 1  ? Years of education: Not on file  ? Highest education level: Not on file  ?Occupational History  ? Not on file  ?Tobacco Use  ? Smoking status: Former  ?  Packs/day: 0.25  ?  Years: 3.00  ?  Pack years: 0.75  ?  Types: Cigarettes  ?  Quit date: 01/30/2016  ?  Years since quitting: 5.5  ? Smokeless tobacco: Never  ?Vaping Use  ? Vaping Use: Never used  ?Substance and Sexual Activity  ? Alcohol use: No  ?  Comment: occ  ? Drug use: No  ? Sexual activity: Not Currently  ?  Birth control/protection: Condom  ?Other Topics Concern  ? Not on file  ?Social History Narrative  ? Not on file  ? ?Social Determinants of Health  ? ?Financial Resource Strain: Not on file  ?Food Insecurity: Not on file  ?Transportation Needs: Not on file  ?Physical Activity: Not on file  ?Stress: Not on file  ?Social Connections: Not on file  ? ? ?Family History  ?Problem Relation Age of Onset  ? Allergic rhinitis Father   ? Asthma Mother   ? Allergic rhinitis Mother   ?  Coronary artery disease Paternal Grandfather   ? Cancer Paternal Grandmother   ?     breast  ? Diabetes Maternal Grandmother   ? Thyroid disease Maternal Grandmother   ? Heart disease Maternal Grandmother   ? Melanoma Maternal  Grandfather   ? Allergic rhinitis Brother   ? Allergic rhinitis Brother   ? Angioedema Neg Hx   ? Atopy Neg Hx   ? Eczema Neg Hx   ? Immunodeficiency Neg Hx   ? Urticaria Neg Hx   ? ? ?

## 2021-08-02 NOTE — Telephone Encounter (Signed)
Pt aware that D&C scheduled for 08/06/21 at 1 pm with Dr Charlotta Newton, be there at 10 am and do not eat or drink after midnight. ?She did not want to take cytotec.  ?

## 2021-08-02 NOTE — Progress Notes (Signed)
K[place] 5+6 wks,single IUP,no FHT,CRL 3.35 mm,GS 35.2 mm,normal right ovary,simple left ovarian cyst 3.2 x 1.8 x 2.7 cm,Dr Despina Hidden discussed results with patient  ?

## 2021-08-02 NOTE — Telephone Encounter (Signed)
Pt aware QHCG did decrease but will get Korea today at 11:45 for Korea to assess for retained POC ?

## 2021-08-05 MED ORDER — GENTAMICIN SULFATE 40 MG/ML IJ SOLN: 5.0000 mg/kg | INTRAVENOUS | Status: DC

## 2021-08-05 MED ORDER — CLINDAMYCIN PHOSPHATE 900 MG/50ML IV SOLN: 900.0000 mg | INTRAVENOUS | Status: DC

## 2021-08-05 NOTE — H&P (Signed)
[facility]ulty [facility] Obstetrics and Gynecology Attending History and Physical ? ?Kelly Watts is a 26 y.o. [phone] who presents for scheduled D&E due to miscarriage. ? ?In review, [place] on 3/22 confirmed miscarriage- IUP with no FHT measuring ~ 5wk size.  She was treated with cytotec; however, noted minimal bleeding.  Follow up US completed on 3/31 showed gestational sac still present.  At that time, plan was made to proceed with surgical intervention. ? ?Today she notes only light spotting.  Minimal pelvic  pain.  Denies fevers, chills, sweats, dysuria, nausea, vomiting, other GI or GU symptoms or other general symptoms. ? ?RhoGAM given- 3/31 ? ?Past Medical History:  ?Diagnosis Date  ? Angio-edema   ? Asthma   ? Eczema   ? Encounter for Nexplanon removal 11/16/2015  ? History of kidney stones   ? History of kidney stones   ? Kidney infection   ? Nexplanon insertion 11/02/2012  ? Inserted left arm 11/02/12  ? Patient underweight   ? Recurrent upper respiratory infection (URI)   ? Urticaria   ? ?Past Surgical History:  ?Procedure Laterality Date  ? EAR TUBE REMOVAL    ? EXTRACORPOREAL SHOCK WAVE LITHOTRIPSY Left 10/30/2016  ? Procedure: LEFT EXTRACORPOREAL SHOCK WAVE LITHOTRIPSY (ESWL);  Surgeon: Crist Fat, MD;  Location: [facility];  Service: Urology;  Laterality: Left;  ? TYMPANOSTOMY TUBE PLACEMENT    ? WISDOM TOOTH EXTRACTION    ? ?OB History  ?Gravida Para Term Preterm AB Living  ?4 3 2 1   2   ?SAB IAB Ectopic Multiple Live Births  ?      0 2  ?  ?# Outcome Date GA Lbr Len/2nd Weight Sex Delivery Anes PTL Lv  ?4 Gravida           ?3 Term 03/07/20 [redacted]w[redacted]d 03:50 / 00:06 2690 g F Vag-Spont EPI  LIV  ?2 Term 08/11/17 [redacted]w[redacted]d 15:19 / 01:00 3530 g F Vag-Spont EPI N LIV  ?1 Preterm 07/25/12 [redacted]w[redacted]d 07:35 / 00:04 425 g F Vag-Spont None  FD  ?Patient denies any other pertinent gynecologic issues.  ?No current facility-administered medications on file prior to encounter.  ? ?Current Outpatient Medications on File Prior to  Encounter  ?Medication Sig Dispense Refill  ? Prenatal Vit-Fe Fumarate-FA (PRENATAL VITAMINS) 28-0.8 MG TABS Take 1 tablet by mouth daily.    ? ?Allergies  ?Allergen Reactions  ? Penicillins Hives  ?  Has patient had a PCN reaction causing immediate rash, facial/tongue/throat swelling, SOB or lightheadedness with hypotension: Yes ?Has patient had a PCN reaction causing severe rash involving mucus membranes or skin necrosis: No ?Has patient had a PCN reaction that required hospitalization: No ?Has patient had a PCN reaction occurring within the last 10 years: No ?If all of the above answers are "NO", then may proceed with Cephalosporin use. ?  ? Sulfa Antibiotics Hives  ? ? ?Social History:   reports that she quit smoking about 5 years ago. Her smoking use included cigarettes. She has a 0.75 pack-year smoking history. She has never used smokeless tobacco. She reports that she does not drink alcohol and does not use drugs. ?Family History  ?Problem Relation Age of Onset  ? Allergic rhinitis Father   ? Asthma Mother   ? Allergic rhinitis Mother   ? Coronary artery disease Paternal Grandfather   ? Cancer Paternal Grandmother   ?     breast  ? Diabetes Maternal Grandmother   ? Thyroid disease Maternal Grandmother   ?  Heart disease Maternal Grandmother   ? Melanoma Maternal Grandfather   ? Allergic rhinitis Brother   ? Allergic rhinitis Brother   ? Angioedema Neg Hx   ? Atopy Neg Hx   ? Eczema Neg Hx   ? Immunodeficiency Neg Hx   ? Urticaria Neg Hx   ? ? ?Review of Systems: Pertinent items noted in HPI and remainder of comprehensive ROS otherwise negative. ? ?PHYSICAL EXAM: ?not currently breastfeeding. ?CONSTITUTIONAL: Well-developed, well-nourished female in no acute distress.  ?SKIN: Skin is warm and dry. No rash noted. Not diaphoretic. No erythema. No pallor. ?NEUROLOGIC: Alert and oriented to person, place, and time. Normal reflexes, muscle tone coordination. No cranial nerve deficit noted. ?PSYCHIATRIC: Normal mood  and affect. Normal behavior. Normal judgment and thought content. ?CARDIOVASCULAR: Normal heart rate noted, regular rhythm ?RESPIRATORY: Effort and breath sounds normal, no problems with respiration noted ?ABDOMEN: Soft, nontender, nondistended. ?PELVIC: deferred ?MUSCULOSKELETAL: no calf tenderness bilaterally ?EXT: no edema bilaterally, normal pulses ? ?Labs: ?Results for orders placed or performed in visit on 08/01/21 (from the past 336 hour(s))  ?Beta hCG quant (ref lab)  ? Collection Time: 08/01/21 12:28 PM  ?Result Value Ref Range  ? hCG Quant 48,341 mIU/mL  ?Results for orders placed or performed in visit on 07/24/21 (from the past 336 hour(s))  ?Beta hCG quant (ref lab)  ? Collection Time: 07/24/21  4:14 PM  ?Result Value Ref Range  ? hCG Quant 86,984 mIU/mL  ? ? ?Imaging Studies: ?US OB Comp Less 14 Wks ? ?Result Date: 08/02/2021 ?Table formatting from the original result was not included. Images from the original result were not included.  ..an [employer] Technical sales engineer([employer]) accredited practice Center for [facility] [address] [place] [phone] Ordering Provider: Adline PotterGriffin, Gloris Shiroma A, NP FOLLOW UP SONOGRAM [name] is in the office for a follow up sonogram to R/O retained products of conception. She is a 26 y.o. year old [id number] with Estimated Date of Delivery: 02/20/2022. by LMP now at  11+[redacted] weeks gestation. Thus far the pregnancy has been complicated by no fetal heart tones on prior ultrasound,QHCG not dropping appropriately. GESTATION: SINGLETON FETAL ACTIVITY:          Heart rate         No FHT          CERVIX: Appears closed ADNEXA: normal right ovary,simple left ovarian cyst 3.2 x 1.8 x 2.7 cm GESTATIONAL AGE AND  BIOMETRICS: Gestational criteria: Estimated Date of Delivery: 02/20/2022 by LMP now at 11+1 wks Previous Scans:0 GESTATIONAL SAC           35.2 mm         8+5 weeks CROWN RUMP LENGTH           3.35 mm         5+6 weeks                                                                       AVERAGE EGA(BY THIS SCAN):  5+6 weeks  SUSPECTED ABNORMALITIES:  yes QUALITY OF SCAN: satisfactory TECHNICIAN COMMENTS: [place] 5+6 wks,single IUP,no FHT,CRL 3.35 mm,GS 35.2 mm,normal right ovary,simple left ovarian cyst 3.2 x 1.8 x 2.7 cm,Dr Despina Hidden discussed results with patient A copy of this report including all images has been saved and backed up to a second source for retrieval if needed. All measures and details of the anatomical scan, placentation, fluid volume and pelvic anatomy are contained in that report. Kelly Watts 08/02/2021 12:36 PM Clinical Impression and recommendations: I have reviewed the sonogram results above, combined with the patient's current clinical course, below are my impressions and any appropriate recommendations for management based on the sonographic findings. Non viable IUP, no cardiac activity with embryonic retention [id number] Estimated Date of Delivery: None noted. Normal general sonographic findings otherwise Lazaro Arms 08/02/2021 1:06 PM  ? ?US OB Comp Less 14 Wks ? ?Result Date: 07/31/2021 ?Table formatting from the original result was not included. Images from the original result were not included.  ..an [employer] Technical sales engineer) accredited practice Center for [facility] [address] [place] [address] Ordering Provider: Lazaro Arms, MD                                                                                   DATING AND VIABILITY SONOGRAM Kelly Watts is a 26 y.o. year old [id number]  Patient's last menstrual period was 05/16/2021 (exact date). which would correlate to  [redacted]w[redacted]d weeks gestation.  She has regular menstrual cycles.   She is here today for a confirmatory initial sonogram. GESTATION: SINGLETON   FETAL ACTIVITY:          Heart rate         No fetal heart tones visualized CERVIX: Appears closed ADNEXA: simple left  corpus luteal cyst 3.1 x 2.8 x 2 cm,normal right ovary GESTATIONAL AGE AND  BIOMETRICS: Gestational criteria: Estimated Date of Delivery: 02/20/22 by LMP now at [redacted]w[redacted]d Previous Scans:0 GESTATIONAL SAC

## 2021-08-06 ENCOUNTER — Telehealth: Payer: Self-pay

## 2021-08-06 ENCOUNTER — Ambulatory Visit (HOSPITAL_COMMUNITY): Payer: Managed Care, Other (non HMO) | Admitting: Certified Registered Nurse Anesthetist

## 2021-08-06 ENCOUNTER — Ambulatory Visit (HOSPITAL_BASED_OUTPATIENT_CLINIC_OR_DEPARTMENT_OTHER): Payer: Managed Care, Other (non HMO) | Admitting: Certified Registered Nurse Anesthetist

## 2021-08-06 ENCOUNTER — Encounter (HOSPITAL_COMMUNITY)
Admission: RE | Disposition: A | Discharge status: Home / Self Care | Payer: Self-pay | Source: Home / Self Care | Attending: Obstetrics & Gynecology

## 2021-08-06 ENCOUNTER — Ambulatory Visit (HOSPITAL_COMMUNITY)
Admission: RE | Admit: 2021-08-06 | Discharge: 2021-08-06 | Disposition: A | Discharge status: Home / Self Care | Payer: Managed Care, Other (non HMO) | Attending: Obstetrics & Gynecology | Admitting: Obstetrics & Gynecology

## 2021-08-06 DIAGNOSIS — O021 Missed abortion: Secondary | ICD-10-CM | POA: Diagnosis not present

## 2021-08-06 DIAGNOSIS — O039 Complete or unspecified spontaneous abortion without complication: Secondary | ICD-10-CM | POA: Diagnosis not present

## 2021-08-06 DIAGNOSIS — Z87891 Personal history of nicotine dependence: Secondary | ICD-10-CM | POA: Diagnosis not present

## 2021-08-06 DIAGNOSIS — Z882 Allergy status to sulfonamides status: Secondary | ICD-10-CM | POA: Diagnosis not present

## 2021-08-06 DIAGNOSIS — Z880 Allergy status to penicillin: Secondary | ICD-10-CM | POA: Diagnosis not present

## 2021-08-06 HISTORY — PX: DILATION AND CURETTAGE OF UTERUS: SHX78

## 2021-08-06 SURGERY — DILATION AND CURETTAGE
Anesthesia: General | Site: Vagina

## 2021-08-06 MED ORDER — ONDANSETRON HCL 4 MG/2ML IJ SOLN
INTRAMUSCULAR | Status: AC
  Filled 2021-08-06: qty 6

## 2021-08-06 MED ORDER — HYDROMORPHONE HCL 1 MG/ML IJ SOLN: 0.2500 mg | INTRAMUSCULAR | Status: DC | PRN

## 2021-08-06 MED ORDER — DEXAMETHASONE SODIUM PHOSPHATE 10 MG/ML IJ SOLN
INTRAMUSCULAR | Status: DC | PRN
  Administered 2021-08-06: 5 mg via INTRAVENOUS

## 2021-08-06 MED ORDER — CLINDAMYCIN PHOSPHATE 900 MG/50ML IV SOLN
900.0000 mg | Freq: Once | INTRAVENOUS | Status: AC
  Administered 2021-08-06: 900 mg via INTRAVENOUS
  Filled 2021-08-06: qty 50

## 2021-08-06 MED ORDER — ONDANSETRON HCL 4 MG/2ML IJ SOLN: 4.0000 mg | Freq: Once | INTRAMUSCULAR | Status: DC | PRN

## 2021-08-06 MED ORDER — FENTANYL CITRATE (PF) 100 MCG/2ML IJ SOLN
INTRAMUSCULAR | Status: AC
  Filled 2021-08-06: qty 2

## 2021-08-06 MED ORDER — KETOROLAC TROMETHAMINE 30 MG/ML IJ SOLN
30.0000 mg | Freq: Once | INTRAMUSCULAR | Status: AC
  Administered 2021-08-06: 30 mg via INTRAVENOUS
  Filled 2021-08-06: qty 1

## 2021-08-06 MED ORDER — FENTANYL CITRATE (PF) 250 MCG/5ML IJ SOLN
INTRAMUSCULAR | Status: DC | PRN
  Administered 2021-08-06 (×2): 25 ug via INTRAVENOUS

## 2021-08-06 MED ORDER — IBUPROFEN 600 MG PO TABS
600.0000 mg | ORAL_TABLET | Freq: Four times a day (QID) | ORAL | 0 refills | Status: DC | PRN
Start: 2021-08-06 — End: 2022-02-25

## 2021-08-06 MED ORDER — PROPOFOL 10 MG/ML IV BOLUS
INTRAVENOUS | Status: DC | PRN
  Administered 2021-08-06: 200 mg via INTRAVENOUS

## 2021-08-06 MED ORDER — SILVER NITRATE-POT NITRATE 75-25 % EX MISC
CUTANEOUS | Status: DC | PRN
  Administered 2021-08-06: 1

## 2021-08-06 MED ORDER — GENTAMICIN SULFATE 40 MG/ML IJ SOLN
300.0000 mg | Freq: Once | INTRAVENOUS | Status: AC
  Administered 2021-08-06: 300 mg via INTRAVENOUS
  Filled 2021-08-06: qty 7.5

## 2021-08-06 MED ORDER — CHLORHEXIDINE GLUCONATE 0.12 % MT SOLN
15.0000 mL | Freq: Once | OROMUCOSAL | Status: AC
  Administered 2021-08-06: 15 mL via OROMUCOSAL

## 2021-08-06 MED ORDER — ORAL CARE MOUTH RINSE: 15.0000 mL | Freq: Once | OROMUCOSAL | Status: AC

## 2021-08-06 MED ORDER — LACTATED RINGERS IV SOLN: INTRAVENOUS | Status: DC

## 2021-08-06 MED ORDER — ONDANSETRON HCL 4 MG/2ML IJ SOLN
INTRAMUSCULAR | Status: DC | PRN
  Administered 2021-08-06: 4 mg via INTRAVENOUS

## 2021-08-06 MED ORDER — LIDOCAINE HCL (CARDIAC) PF 100 MG/5ML IV SOSY
PREFILLED_SYRINGE | INTRAVENOUS | Status: DC | PRN
  Administered 2021-08-06: 50 mg via INTRAVENOUS

## 2021-08-06 MED ORDER — POVIDONE-IODINE 10 % EX SWAB: 2.0000 "application " | Freq: Once | CUTANEOUS | Status: DC

## 2021-08-06 MED ORDER — LIDOCAINE HCL (PF) 2 % IJ SOLN
INTRAMUSCULAR | Status: AC
Start: 2021-08-06 — End: ?
  Filled 2021-08-06: qty 10

## 2021-08-06 MED ORDER — DEXAMETHASONE SODIUM PHOSPHATE 10 MG/ML IJ SOLN
INTRAMUSCULAR | Status: AC
  Filled 2021-08-06: qty 1

## 2021-08-06 MED ORDER — MIDAZOLAM HCL 2 MG/2ML IJ SOLN
INTRAMUSCULAR | Status: DC | PRN
  Administered 2021-08-06: 2 mg via INTRAVENOUS

## 2021-08-06 MED ORDER — MEPERIDINE HCL 50 MG/ML IJ SOLN: 6.2500 mg | INTRAMUSCULAR | Status: DC | PRN

## 2021-08-06 MED ORDER — MIDAZOLAM HCL 2 MG/2ML IJ SOLN
INTRAMUSCULAR | Status: AC
  Filled 2021-08-06: qty 2

## 2021-08-06 SURGICAL SUPPLY — 20 items
CATH ROBINSON RED A/P 16FR (CATHETERS) ×2 IMPLANT
CLOTH BEACON ORANGE TIMEOUT ST (SAFETY) ×2 IMPLANT
CNTNR URN SCR LID CUP LEK RST (MISCELLANEOUS) ×1 IMPLANT
CONT SPEC 4OZ STRL OR WHT (MISCELLANEOUS) ×1
COVER SURGICAL LIGHT HANDLE (MISCELLANEOUS) ×4 IMPLANT
DECANTER SPIKE VIAL GLASS SM (MISCELLANEOUS) ×2 IMPLANT
GLOVE SURG ENC MOIS LTX SZ6.5 (GLOVE) ×2 IMPLANT
GLOVE SURG UNDER POLY LF SZ7 (GLOVE) ×4 IMPLANT
GOWN STRL REUS W/ TWL LRG LVL3 (GOWN DISPOSABLE) ×2 IMPLANT
GOWN STRL REUS W/TWL LRG LVL3 (GOWN DISPOSABLE) ×2
KIT BERKELEY 1ST TRIMESTER 3/8 (MISCELLANEOUS) ×2 IMPLANT
KIT TURNOVER KIT A (KITS) ×2 IMPLANT
NS IRRIG 1000ML POUR BTL (IV SOLUTION) ×2 IMPLANT
PACK VAGINAL MINOR WOMEN LF (CUSTOM PROCEDURE TRAY) ×2 IMPLANT
PAD ARMBOARD 7.5X6 YLW CONV (MISCELLANEOUS) ×2 IMPLANT
SET BASIN LINEN APH (SET/KITS/TRAYS/PACK) ×2 IMPLANT
SET BERKELEY SUCTION TUBING (SUCTIONS) ×2 IMPLANT
TOWEL GREEN STERILE FF (TOWEL DISPOSABLE) ×4 IMPLANT
UNDERPAD 30X36 HEAVY ABSORB (UNDERPADS AND DIAPERS) ×2 IMPLANT
VACURETTE 8 RIGID CVD (CANNULA) ×1 IMPLANT

## 2021-08-06 NOTE — Transfer of Care (Signed)
Immediate Anesthesia Transfer of Care Note ? ?Patient: Kelly Watts ? ?Procedure(s) Performed: SUCTION DILATATION AND CURETTAGE (Vagina ) ? ?Patient Location: PACU ? ?Anesthesia Type:General ? ?Level of Consciousness: drowsy ? ?Airway & Oxygen Therapy: Patient Spontanous Breathing ? ?Post-op Assessment: Report given to RN and Post -op Vital signs reviewed and stable ? ?Post vital signs: Reviewed and stable ? ?Last Vitals:  ?Vitals Value Taken Time  ?BP    ?Temp    ?Pulse 75 08/06/21 1128  ?Resp 11 08/06/21 1128  ?SpO2 92 % 08/06/21 1128  ?Vitals shown include unvalidated device data. ? ?Last Pain:  ?Vitals:  ? 08/06/21 1041  ?PainSc: 0-No pain  ?   ? ?  ? ?Complications: No notable events documented. ?

## 2021-08-06 NOTE — Anesthesia Postprocedure Evaluation (Signed)
Anesthesia Post Note ? ?Patient: Kelly Watts ? ?Procedure(s) Performed: SUCTION DILATATION AND CURETTAGE (Vagina ) ? ?Patient location during evaluation: PACU ?Anesthesia Type: General ?Level of consciousness: awake and alert and oriented ?Pain management: pain level controlled ?Vital Signs Assessment: post-procedure vital signs reviewed and stable ?Respiratory status: spontaneous breathing, nonlabored ventilation and respiratory function stable ?Cardiovascular status: blood pressure returned to baseline and stable ?Postop Assessment: no apparent nausea or vomiting ?Anesthetic complications: no ? ? ?No notable events documented. ? ? ?Last Vitals:  ?Vitals:  ? 08/06/21 1200 08/06/21 1225  ?BP: 92/71 102/66  ?Pulse: 73 65  ?Resp: 12 16  ?Temp:  (!) 36.4 ?C  ?SpO2: 100% 100%  ?  ?Last Pain:  ?Vitals:  ? 08/06/21 1225  ?TempSrc: Oral  ?PainSc: 0-No pain  ? ? ?  ?  ?  ?  ?  ?  ? ?Shantell Belongia C Atreyu Mak ? ? ? ? ?

## 2021-08-06 NOTE — Discharge Instructions (Addendum)
HOME INSTRUCTIONS ? ?Please note any unusual or excessive bleeding, pain, swelling. Mild dizziness or drowsiness are normal for about 24 hours after surgery. ?  ?Shower when comfortable ? ?Restrictions: No driving for 24 hours or while taking pain medications. ? ?Activity:  No heavy lifting (> 10 lbs), nothing in vagina (no tampons, douching, or intercourse) x 4 weeks; no tub baths for 4 weeks ?Vaginal spotting is expected but if your bleeding is heavy, period like,  please call the office ?  ?Incision: the bandaids will fall off when they are ready to; you may clean your incision with mild soap and water but do not rub or scrub the incision site.  You may experience slight bloody drainage from your incision periodically.  This is normal.  If you experience a large amount of drainage or the incision opens, please call your physician who will likely direct you to the emergency department. ? ?Diet:  You may return to your regular diet.  Do not eat large meals.  Eat small frequent meals throughout the day.  Continue to drink a good amount of water at least 6-8 glasses of water per day, hydration is very important for the healing process. ? ?Pain Management: Take Ibuprofen and/or tylenol every 6-8 hours with food as needed for pain.  Alcohol -- Avoid for 24 hours and while taking pain medications. ? ?Nausea: Take sips of ginger ale or soda ? ?Fever -- Call physician if temperature over 101 degrees ? ?Follow up:  If you do not already have a follow up appointment scheduled, please call the office at [phone].  If you experience fever (a temperature greater than 100.4), pain unrelieved by pain medication, shortness of breath, swelling of a single leg, or any other symptoms which are concerning to you please the office immediately.   ?

## 2021-08-06 NOTE — Anesthesia Preprocedure Evaluation (Signed)
Anesthesia Evaluation  ?Patient identified by MRN, date of birth, ID band ?Patient awake ? ? ? ?Reviewed: ?Allergy & Precautions, NPO status , Patient's Chart, lab work & pertinent test results ? ?Airway ?Mallampati: II ? ?TM Distance: >3 FB ?Neck ROM: Full ? ? ? Dental ? ?(+) Dental Advisory Given, Teeth Intact ?  ?Pulmonary ?asthma , Recent URI , former smoker,  ?  ?Pulmonary exam normal ?breath sounds clear to auscultation ? ? ? ? ? ? Cardiovascular ?negative cardio ROS ?Normal cardiovascular exam ?Rhythm:Regular Rate:Normal ? ? ?  ?Neuro/Psych ?negative neurological ROS ? negative psych ROS  ? GI/Hepatic ?negative GI ROS, Neg liver ROS,   ?Endo/Other  ?negative endocrine ROS ? Renal/GU ?Renal disease  ?negative genitourinary ?  ?Musculoskeletal ?negative musculoskeletal ROS ?(+)  ? Abdominal ?  ?Peds ?negative pediatric ROS ?(+)  Hematology ?negative hematology ROS ?(+)   ?Anesthesia Other Findings ? ? Reproductive/Obstetrics ?negative OB ROS ? ?  ? ? ? ? ? ? ? ? ? ? ? ? ? ?  ?  ? ? ? ? ? ? ? ?Anesthesia Physical ?Anesthesia Plan ? ?ASA: 2 ? ?Anesthesia Plan: General  ? ?Post-op Pain Management: Dilaudid IV  ? ?Induction: Intravenous ? ?PONV Risk Score and Plan: 4 or greater and Ondansetron, Dexamethasone and Midazolam ? ?Airway Management Planned: LMA ? ?Additional Equipment:  ? ?Intra-op Plan:  ? ?Post-operative Plan: Extubation in OR ? ?Informed Consent: I have reviewed the patients History and Physical, chart, labs and discussed the procedure including the risks, benefits and alternatives for the proposed anesthesia with the patient or authorized representative who has indicated his/her understanding and acceptance.  ? ? ? ?Dental advisory given ? ?Plan Discussed with: CRNA and Surgeon ? ?Anesthesia Plan Comments:   ? ? ? ? ? ? ?Anesthesia Quick Evaluation ? ?

## 2021-08-06 NOTE — Telephone Encounter (Signed)
PT CALLED AND STATED THAT SHE WANTS HER PRESCRIPTION SENT TO [facility]; INSTEAD OF [facility] ?

## 2021-08-06 NOTE — Anesthesia Procedure Notes (Signed)
Procedure Name: LMA Insertion ?Date/Time: 08/06/2021 11:04 AM ?Performed by: Lorin Glass, CRNA ?Pre-anesthesia Checklist: Patient identified, Emergency Drugs available, Suction available and Patient being monitored ?Patient Re-evaluated:Patient Re-evaluated prior to induction ?Oxygen Delivery Method: Circle system utilized ?Preoxygenation: Pre-oxygenation with 100% oxygen ?Induction Type: IV induction ?LMA: LMA inserted ?LMA Size: 3.0 ?Number of attempts: 1 ?Placement Confirmation: breath sounds checked- equal and bilateral and positive ETCO2 ?Tube secured with: Tape ?Dental Injury: Teeth and Oropharynx as per pre-operative assessment  ? ? ? ? ?

## 2021-08-06 NOTE — Op Note (Signed)
Preoperative diagnosis: Missed AB ? ?Postoperative diagnosis: Same ? ?Anesthesia: General  ? ?Procedure: Dilatation and evacuation ? ?Surgeon: Dr. Janyth Pupa ? ?Estimated blood loss: 50cc ?UOP: 20cc ?IVF: 400cc ? ?Procedure: ? ?Pt was taken to the OR where general anesthesia was performed.  She was placed in lithotomy position.  She was prepped and draped in a sterile fashion.  The bladder was drained using a red rubber.  A weighted speculum is inserted in the vagina and the anterior lip of the cervix was grasped with a tenaculum forcep.The uterus was then sounded at 8 cm. Using a #8 curved cannula, we proceed with evacuation of products of conception without difficulty. We then proceed with sharp curettage of the uterine cavity to confirm complete evacuation.  ?Instruments are then removed. Instrument and sponge count were correct. ? ?The procedure is very well tolerated by the patient is taken to recovery room in a well and stable condition. ? ?Specimen: Products of conception sent to pathology  ? ?Janyth Pupa, DO ?Attending [place], Faculty Practice ?Center for [facility] [place] ? ? ?

## 2021-08-07 LAB — SURGICAL PATHOLOGY

## 2021-08-08 LAB — POCT I-STAT, CHEM 8
BUN: 7 mg/dL (ref 6–20)
Calcium, Ion: 1.24 mmol/L (ref 1.15–1.40)
Chloride: 103 mmol/L (ref 98–111)
Creatinine, Ser: 0.8 mg/dL (ref 0.44–1.00)
Glucose, Bld: 91 mg/dL (ref 70–99)
HCT: 37 % (ref 36.0–46.0)
Hemoglobin: 12.6 g/dL (ref 12.0–15.0)
Potassium: 3.6 mmol/L (ref 3.5–5.1)
Sodium: 139 mmol/L (ref 135–145)
TCO2: 27 mmol/L (ref 22–32)

## 2021-08-11 ENCOUNTER — Other Ambulatory Visit: Payer: Self-pay

## 2021-08-11 ENCOUNTER — Emergency Department (HOSPITAL_COMMUNITY)
Admission: EM | Admit: 2021-08-11 | Discharge: 2021-08-12 | Disposition: A | Discharge status: Home / Self Care | Payer: Managed Care, Other (non HMO) | Attending: Emergency Medicine | Admitting: Emergency Medicine

## 2021-08-11 ENCOUNTER — Encounter (HOSPITAL_COMMUNITY): Payer: Self-pay | Admitting: Emergency Medicine

## 2021-08-11 DIAGNOSIS — N854 Malposition of uterus: Secondary | ICD-10-CM | POA: Diagnosis not present

## 2021-08-11 DIAGNOSIS — R9389 Abnormal findings on diagnostic imaging of other specified body structures: Secondary | ICD-10-CM | POA: Diagnosis not present

## 2021-08-11 DIAGNOSIS — N99820 Postprocedural hemorrhage and hematoma of a genitourinary system organ or structure following a genitourinary system procedure: Secondary | ICD-10-CM

## 2021-08-11 DIAGNOSIS — N939 Abnormal uterine and vaginal bleeding, unspecified: Secondary | ICD-10-CM | POA: Diagnosis present

## 2021-08-11 LAB — CBC WITH DIFFERENTIAL/PLATELET
Abs Immature Granulocytes: 0.03 10*3/uL (ref 0.00–0.07)
Basophils Absolute: 0 10*3/uL (ref 0.0–0.1)
Basophils Relative: 1 %
Eosinophils Absolute: 0.2 10*3/uL (ref 0.0–0.5)
Eosinophils Relative: 2 %
HCT: 36.4 % (ref 36.0–46.0)
Hemoglobin: 12.5 g/dL (ref 12.0–15.0)
Immature Granulocytes: 0 %
Lymphocytes Relative: 39 %
Lymphs Abs: 3.5 10*3/uL (ref 0.7–4.0)
MCH: 30.7 pg (ref 26.0–34.0)
MCHC: 34.3 g/dL (ref 30.0–36.0)
MCV: 89.4 fL (ref 80.0–100.0)
Monocytes Absolute: 0.6 10*3/uL (ref 0.1–1.0)
Monocytes Relative: 7 %
Neutro Abs: 4.5 10*3/uL (ref 1.7–7.7)
Neutrophils Relative %: 51 %
Platelets: 326 10*3/uL (ref 150–400)
RBC: 4.07 MIL/uL (ref 3.87–5.11)
RDW: 13.1 % (ref 11.5–15.5)
WBC: 8.8 10*3/uL (ref 4.0–10.5)
nRBC: 0 % (ref 0.0–0.2)

## 2021-08-11 LAB — COMPREHENSIVE METABOLIC PANEL
ALT: 16 U/L (ref 0–44)
AST: 17 U/L (ref 15–41)
Albumin: 4 g/dL (ref 3.5–5.0)
Alkaline Phosphatase: 72 U/L (ref 38–126)
Anion gap: 8 (ref 5–15)
BUN: 16 mg/dL (ref 6–20)
CO2: 26 mmol/L (ref 22–32)
Calcium: 9.2 mg/dL (ref 8.9–10.3)
Chloride: 106 mmol/L (ref 98–111)
Creatinine, Ser: 0.83 mg/dL (ref 0.44–1.00)
GFR, Estimated: >60 mL/min (ref >60)
Glucose, Bld: 102 mg/dL — ABNORMAL HIGH (ref 70–99)
Potassium: 3.7 mmol/L (ref 3.5–5.1)
Sodium: 140 mmol/L (ref 135–145)
Total Bilirubin: 0.3 mg/dL (ref 0.3–1.2)
Total Protein: 7.3 g/dL (ref 6.5–8.1)

## 2021-08-11 NOTE — ED Triage Notes (Signed)
Pt had D&C Tuesday. Started bleeding clots yesterday but only when she goes to bathroom to urinate. Color wnl. C/o pain to suprapubic and r/l lower abd area. Denies urinary changes.  ?

## 2021-08-12 ENCOUNTER — Encounter (HOSPITAL_COMMUNITY): Payer: Self-pay | Admitting: Obstetrics & Gynecology

## 2021-08-12 ENCOUNTER — Emergency Department (HOSPITAL_COMMUNITY): Payer: Managed Care, Other (non HMO)

## 2021-08-12 DIAGNOSIS — N99820 Postprocedural hemorrhage and hematoma of a genitourinary system organ or structure following a genitourinary system procedure: Secondary | ICD-10-CM | POA: Diagnosis not present

## 2021-08-12 DIAGNOSIS — R9389 Abnormal findings on diagnostic imaging of other specified body structures: Secondary | ICD-10-CM | POA: Diagnosis not present

## 2021-08-12 DIAGNOSIS — N854 Malposition of uterus: Secondary | ICD-10-CM | POA: Diagnosis not present

## 2021-08-12 MED ORDER — MISOPROSTOL 200 MCG PO TABS
800.0000 ug | ORAL_TABLET | Freq: Once | ORAL | Status: AC
  Administered 2021-08-12: 800 ug via ORAL
  Filled 2021-08-12: qty 4

## 2021-08-12 MED ORDER — METHYLERGONOVINE MALEATE 0.2 MG/ML IJ SOLN
0.2000 mg | Freq: Once | INTRAMUSCULAR | Status: AC
  Administered 2021-08-12: 0.2 mg via INTRAMUSCULAR
  Filled 2021-08-12: qty 1

## 2021-08-12 MED ORDER — ONDANSETRON 4 MG PO TBDP
4.0000 mg | ORAL_TABLET | Freq: Once | ORAL | Status: AC
  Administered 2021-08-12: 4 mg via ORAL
  Filled 2021-08-12: qty 1

## 2021-08-12 MED ORDER — ACETAMINOPHEN 325 MG PO TABS
650.0000 mg | ORAL_TABLET | Freq: Once | ORAL | Status: AC
  Administered 2021-08-12: 650 mg via ORAL
  Filled 2021-08-12: qty 2

## 2021-08-12 MED ORDER — MISOPROSTOL 200 MCG PO TABS: 400.0000 ug | ORAL_TABLET | Freq: Three times a day (TID) | ORAL | 0 refills | Status: DC

## 2021-08-12 NOTE — ED Notes (Signed)
Pt ambulated to bathroom with help.  ?

## 2021-08-12 NOTE — Discharge Instructions (Addendum)
You have been seen and discharged from the emergency department.  Your blood work showed a stable hemoglobin.  Ultrasound showed thickened endometrial stripe.  Spoke with on-call OB/GYN, Dr. Despina Hidden, there is continued vaginal bleeding to be expected.  You were given a dose of medication here in the department and is also recommended that you take the prescription 3 times daily as directed for the next 3 days.  Follow-up with your OB/GYN for further evaluation and further care. Take home medications as prescribed. If you have any worsening symptoms or further concerns for your health please return to an emergency department for further evaluation. ?

## 2021-08-12 NOTE — ED Notes (Signed)
Some small clots still noted per pt.   ?

## 2021-08-12 NOTE — ED Provider Notes (Signed)
? ?[facility] EMERGENCY DEPARTMENT  ?Provider Note ? ?CSN: [id number] ?Arrival date & time: 08/11/21 2123 ? ?History ?Chief Complaint  ?Patient presents with  ? Vaginal Bleeding  ? ? ?Kelly Watts is a 26 y.o. female who is about 6 days s/p D&C for spontaneous miscarriage that did not respond to oral cytotec. She did well post-op with some mild spotting but in the last 24 hours has begun to pass large clots when she stands up. She called the on-call doctor and was advised to come to the ED. No fever. Some abdominal cramping. No dysuria. No lightheaded or dizziness.  ? ? ?Home Medications ?Prior to Admission medications   ?Medication Sig Start Date End Date Taking? Authorizing Provider  ?ibuprofen (ADVIL) 600 MG tablet Take 1 tablet (600 mg total) by mouth every 6 (six) hours as needed. 08/06/21   Myna Hidalgo, DO  ?Prenatal Vit-Fe Fumarate-FA (PRENATAL VITAMINS) 28-0.8 MG TABS Take 1 tablet by mouth daily.    [provider]  ? ? ? ?Allergies    ?Penicillins and Sulfa antibiotics ? ? ?Review of Systems   ?Review of Systems ?Please see HPI for pertinent positives and negatives ? ?Physical Exam ?BP 115/73   Pulse 68   Temp (!) 97.4 ?F (36.3 ?C) (Oral)   Resp 18   Ht 5\' 3"  (1.6 m)   Wt 60.3 kg   SpO2 99%   BMI 23.56 kg/m?  ? ?Physical Exam ?Vitals and nursing note reviewed.  ?Constitutional:   ?   Appearance: Normal appearance.  ?HENT:  ?   Head: Normocephalic and atraumatic.  ?   Nose: Nose normal.  ?   Mouth/Throat:  ?   Mouth: Mucous membranes are moist.  ?Eyes:  ?   Extraocular Movements: Extraocular movements intact.  ?   Conjunctiva/sclera: Conjunctivae normal.  ?Cardiovascular:  ?   Rate and Rhythm: Normal rate.  ?Pulmonary:  ?   Effort: Pulmonary effort is normal.  ?   Breath sounds: Normal breath sounds.  ?Abdominal:  ?   General: Abdomen is flat.  ?   Palpations: Abdomen is soft.  ?   Tenderness: There is no abdominal tenderness.  ?Genitourinary: ?   Comments: Chaperone present: clots in  vagina, no active bleeding, os is fingertip ?Musculoskeletal:     ?   General: No swelling. Normal range of motion.  ?   Cervical back: Neck supple.  ?Skin: ?   General: Skin is warm and dry.  ?Neurological:  ?   General: No focal deficit present.  ?   Mental Status: She is alert.  ?Psychiatric:     ?   Mood and Affect: Mood normal.  ? ? ?ED Results / Procedures / Treatments   ?EKG ?None ? ?Procedures ?Procedures ? ?Medications Ordered in the ED ?Medications  ?methylergonovine (METHERGINE) injection 0.2 mg (0.2 mg Intramuscular Given 08/12/21 0340)  ?ondansetron (ZOFRAN-ODT) disintegrating tablet 4 mg (4 mg Oral Given 08/12/21 0524)  ?acetaminophen (TYLENOL) tablet 650 mg (650 mg Oral Given 08/12/21 0523)  ? ? ?Initial Impression and Plan ? Patient with vaginal bleeding post D&C, Hgb is normal. BMP is normal. I spoke with Dr. 10/12/21, Ob/Gyn who recommends a dose of Methergine to see if that can stop her bleeding. Will continue to monitor in the ED.  ? ?ED Course  ? ?Clinical Course as of 08/12/21 0651  ?Mon Aug 12, 2021  ?0622 Patient reports clots seem to be smaller. If she is still passing significant blood with standing,  will hold here and check an [place]. If she's improved enough for discharge, will have her follow up with Gyn.  [CS]  ?2202 Patient still passing relatively large clots. Will check [place]. Care will be signed out to the oncoming team at shift change.  [CS]  ?  ?Clinical Course User Index ?[CS] Pollyann Savoy, MD  ? ? ? ?MDM Rules/Calculators/A&P ?Medical Decision Making ?Problems Addressed: ?Postoperative vaginal bleeding following genitourinary procedure: acute illness or injury ? ?Amount and/or Complexity of Data Reviewed ?Labs: ordered. Decision-making details documented in ED Course. ?Radiology: ordered. ? ?Risk ?OTC drugs. ?Prescription drug management. ? ? ? ?Final Clinical Impression(s) / ED Diagnoses ?Final diagnoses:  ?Postoperative vaginal bleeding following genitourinary procedure  ? ? ?Rx /  DC Orders ?ED Discharge Orders   ? ? None  ? ?  ? ?  ?Pollyann Savoy, MD ?08/12/21 [phone] ? ?

## 2021-08-12 NOTE — ED Provider Notes (Signed)
Patient signed out to me by previous provider. Please refer to their note for full HPI.  Briefly this is a 26 year old female who presented to the emergency department status post D&C with vaginal bleeding.  Previous provider evaluated the patient, spoke with OB/GYN who recommended Methergine and ultrasound. ?Physical Exam  ?BP 107/65   Pulse (!) 57   Temp (!) 97.4 ?F (36.3 ?C) (Oral)   Resp 14   Ht 5\' 3"  (1.6 m)   Wt 60.3 kg   SpO2 98%   BMI 23.56 kg/m?  ? ?Physical Exam ?Vitals and nursing note reviewed.  ?Constitutional:   ?   Appearance: Normal appearance.  ?HENT:  ?   Head: Normocephalic.  ?   Mouth/Throat:  ?   Mouth: Mucous membranes are moist.  ?Cardiovascular:  ?   Rate and Rhythm: Normal rate.  ?Pulmonary:  ?   Effort: Pulmonary effort is normal. No respiratory distress.  ?Abdominal:  ?   Palpations: Abdomen is soft.  ?   Tenderness: There is no abdominal tenderness.  ?Skin: ?   General: Skin is warm.  ?Neurological:  ?   Mental Status: She is alert and oriented to person, place, and time. Mental status is at baseline.  ?Psychiatric:     ?   Mood and Affect: Mood normal.  ? ? ?Procedures  ?Procedures ? ?ED Course / MDM  ? ?Clinical Course as of 08/12/21 1153  ?Mon Aug 12, 2021  ?0622 Patient reports clots seem to be smaller. If she is still passing significant blood with standing, will hold here and check an [place]. If she's improved enough for discharge, will have her follow up with Gyn.  [CS]  ?0648 Patient still passing relatively large clots. Will check [place]. Care will be signed out to the oncoming team at shift change.  [CS]  ?  ?Clinical Course User Index ?[CS] Truddie Hidden, MD  ? ?Medical Decision Making ?Amount and/or Complexity of Data Reviewed ?Labs: ordered. ?Radiology: ordered. ? ?Risk ?OTC drugs. ?Prescription drug management. ? ? ?Ultrasound shows thickened endometrium stripe.  Spoke with on-call OB/GYN, Dr. Elonda Husky who personally reviewed the ultrasound imaging.  No signs of retained  products.  He recommends dose of Cytotec here in the department with 3 times daily dosing for the next 3 days and outpatient follow-up.  Hemoglobin is stable, no signs of acute blood loss/anemia.  No signs of infection.  Patient and mom understand the plan.  Patient at this time appears safe and stable for discharge and close outpatient follow up. Discharge plan and strict return to ED precautions discussed, patient verbalizes understanding and agreement. ? ? ?  ?Lorelle Gibbs, DO ?08/12/21 1154 ? ?

## 2021-08-12 NOTE — ED Notes (Addendum)
Pt ambulated to restroom, pt passed multiple large clots. Pt cleaned and changed.  ?

## 2021-08-15 ENCOUNTER — Encounter: Payer: Self-pay | Admitting: Adult Health

## 2021-08-15 ENCOUNTER — Ambulatory Visit (INDEPENDENT_AMBULATORY_CARE_PROVIDER_SITE_OTHER): Payer: Managed Care, Other (non HMO) | Admitting: Adult Health

## 2021-08-15 VITALS — BP 105/66 | HR 89 | Ht 63.0 in | Wt 132.0 lb

## 2021-08-15 DIAGNOSIS — N920 Excessive and frequent menstruation with regular cycle: Secondary | ICD-10-CM

## 2021-08-15 DIAGNOSIS — O039 Complete or unspecified spontaneous abortion without complication: Secondary | ICD-10-CM

## 2021-08-15 NOTE — Progress Notes (Signed)
?  Subjective:  ?  ? Patient ID: Kelly Watts, female   DOB: 04/28/1996, 26 y.o.   MRN: [id number] ? ?HPI ?Kelly Watts is a 26 year old white female, married, [id number], back in follow up after having D&C for miscarriage 08/06/21. And had bleeding and clots and was seen in ER 08/12/21 and given methergine and then Cytotec, US showed thickened endometrium can not exclude retained POC.  ?Lab Results  ?Component Value Date  ? DIAGPAP  10/24/2020  ?  - Negative for intraepithelial lesion or malignancy (NILM)  ? HPVHIGH Negative 10/24/2020  ?  ?Review of Systems ?Still spotting ?Has some pain left side ?No fever ?Reviewed past medical,surgical, social and family history. Reviewed medications and allergies.  ?   ?Objective:  ? Physical Exam ?BP 105/66 (BP Location: Right Arm, Patient Position: Sitting, Cuff Size: Normal)   Pulse 89   Ht 5\' 3"  (1.6 m)   Wt 132 lb (59.9 kg)   Breastfeeding No   BMI 23.38 kg/m?   ?  Skin warm and dry.  Lungs: clear to ausculation bilaterally. Cardiovascular: regular rate and rhythm.  ?Abdomen is soft and non tender. ?  Pelvic deferred ? Upstream - 08/15/21 0930   ? ?  ? Pregnancy Intention Screening  ? Does the patient want to become pregnant in the next year? Unsure   ? Does the patient's partner want to become pregnant in the next year? Unsure   ? Would the patient like to discuss contraceptive options today? N/A   ?  ? Contraception Wrap Up  ? Current Method Abstinence   ? End Method Abstinence   ? Contraception Counseling Provided No   ? ?  ?  ? ?  ?  ?Assessment:  ?   ? 1.Sp miscarriage, with D&C, still spotting ?  -finish Cytotec ?Check QHCG today ?Will get pelvic 08/17/21 08/22/21 at 2:30 pm at [facility] to make sure that uterus has no thickened endometrium now ? ?Plan:  ?   ?Follow up with me in 2 weeks or before if needed  ?   ?

## 2021-08-16 LAB — BETA HCG QUANT (REF LAB): hCG Quant: 357 m[IU]/mL

## 2021-08-22 ENCOUNTER — Ambulatory Visit (HOSPITAL_COMMUNITY): Payer: Managed Care, Other (non HMO)

## 2021-08-28 ENCOUNTER — Ambulatory Visit (HOSPITAL_COMMUNITY)
Admission: RE | Admit: 2021-08-28 | Discharge: 2021-08-28 | Disposition: A | Discharge status: Home / Self Care | Payer: Managed Care, Other (non HMO) | Source: Ambulatory Visit | Attending: Adult Health | Admitting: Adult Health

## 2021-08-28 DIAGNOSIS — O039 Complete or unspecified spontaneous abortion without complication: Secondary | ICD-10-CM | POA: Insufficient documentation

## 2021-08-28 DIAGNOSIS — N920 Excessive and frequent menstruation with regular cycle: Secondary | ICD-10-CM | POA: Diagnosis present

## 2021-08-29 ENCOUNTER — Encounter: Payer: Self-pay | Admitting: Adult Health

## 2021-08-29 ENCOUNTER — Ambulatory Visit (INDEPENDENT_AMBULATORY_CARE_PROVIDER_SITE_OTHER): Payer: Managed Care, Other (non HMO) | Admitting: Adult Health

## 2021-08-29 VITALS — BP 115/69 | HR 95 | Ht 63.0 in | Wt 133.0 lb

## 2021-08-29 DIAGNOSIS — Z3201 Encounter for pregnancy test, result positive: Secondary | ICD-10-CM | POA: Insufficient documentation

## 2021-08-29 DIAGNOSIS — N649 Disorder of breast, unspecified: Secondary | ICD-10-CM | POA: Insufficient documentation

## 2021-08-29 DIAGNOSIS — Z8759 Personal history of other complications of pregnancy, childbirth and the puerperium: Secondary | ICD-10-CM

## 2021-08-29 LAB — POCT URINE PREGNANCY: Preg Test, Ur: POSITIVE — AB

## 2021-08-29 NOTE — Progress Notes (Signed)
?  Subjective:  ?  ? Patient ID: Kelly Watts, female   DOB: 1995/06/23, 26 y.o.   MRN: [id number] ? ?HPI ?[name] is a 26 year old white female, married, [id number], back in follow up after D&C for miscarriage and bleeding has stopped and no pain now. She had follow up US 08/28/21. ? ?Lab Results  ?Component Value Date  ? DIAGPAP  10/24/2020  ?  - Negative for intraepithelial lesion or malignancy (NILM)  ? HPVHIGH Negative 10/24/2020  ?  ?Review of Systems ?No bleeding or pain. ?Reviewed past medical,surgical, social and family history. Reviewed medications and allergies.  ?   ?Objective:  ? Physical Exam ?BP 115/69 (BP Location: Left Arm, Patient Position: Sitting, Cuff Size: Normal)   Pulse 95   Ht 5\' 3"  (1.6 m)   Wt 133 lb (60.3 kg)   Breastfeeding No   BMI 23.56 kg/m?  UPT is + ?Skin warm and dry. Lungs: clear to ausculation bilaterally. Cardiovascular: regular rate and rhythm.  ?  Reviewed :There is inhomogeneous echogenicity in the endometrium within the ?fundus and body of the uterus. There is a 5 mm focus of increased ?vascular flow in the endometrium. This may suggest small amount of ?retained products of conception or endometrial polyp. There is trace ?amount of fluid in the cervical canal suggesting blood products. ?  ?There is 3.1 cm cystic structure with thin internal septation in the ?right ovary, possibly due to confluence of follicles or functional ?ovarian cysts in the right ovary. ?This is not of concern, discussed with Dr [place]. ? ?Fall risk is low ? Upstream - 08/29/21 0919   ? ?  ? Pregnancy Intention Screening  ? Does the patient want to become pregnant in the next year? Ok Either Way   ? Does the patient's partner want to become pregnant in the next year? Ok Either Way   ? Would the patient like to discuss contraceptive options today? No   ?  ? Contraception Wrap Up  ? Current Method Female Condom   ? End Method Female Condom   ? ?  ?  ? ?  ?  ?Assessment:  ?   ?1. Pregnancy test  positive ?It may take few more weeks to be negative  ? ?2. History of miscarriage ?Bleeding and pain has stopped  ?   ?Plan:  ?   ?Return in 2 months for physical  ?   ?

## 2021-09-02 DIAGNOSIS — Z419 Encounter for procedure for purposes other than remedying health state, unspecified: Secondary | ICD-10-CM | POA: Diagnosis not present

## 2021-09-10 ENCOUNTER — Other Ambulatory Visit: Payer: Self-pay | Admitting: Adult Health

## 2021-09-10 DIAGNOSIS — Z8759 Personal history of other complications of pregnancy, childbirth and the puerperium: Secondary | ICD-10-CM

## 2021-09-10 NOTE — Progress Notes (Signed)
Ck QHCG  

## 2021-09-12 LAB — BETA HCG QUANT (REF LAB): hCG Quant: 12 m[IU]/mL

## 2021-10-03 DIAGNOSIS — Z419 Encounter for procedure for purposes other than remedying health state, unspecified: Secondary | ICD-10-CM | POA: Diagnosis not present

## 2021-10-08 ENCOUNTER — Other Ambulatory Visit: Payer: Managed Care, Other (non HMO)

## 2021-10-08 DIAGNOSIS — O039 Complete or unspecified spontaneous abortion without complication: Secondary | ICD-10-CM

## 2021-10-09 LAB — BETA HCG QUANT (REF LAB): hCG Quant: 5 m[IU]/mL

## 2021-10-17 ENCOUNTER — Ambulatory Visit (INDEPENDENT_AMBULATORY_CARE_PROVIDER_SITE_OTHER): Payer: Managed Care, Other (non HMO)

## 2021-10-17 ENCOUNTER — Ambulatory Visit
Admission: EM | Admit: 2021-10-17 | Discharge: 2021-10-17 | Disposition: A | Discharge status: Home / Self Care | Payer: Managed Care, Other (non HMO) | Attending: Nurse Practitioner | Admitting: Nurse Practitioner

## 2021-10-17 ENCOUNTER — Encounter: Payer: Self-pay | Admitting: Emergency Medicine

## 2021-10-17 DIAGNOSIS — M79671 Pain in right foot: Secondary | ICD-10-CM | POA: Diagnosis not present

## 2021-10-17 DIAGNOSIS — S93601A Unspecified sprain of right foot, initial encounter: Secondary | ICD-10-CM

## 2021-10-17 NOTE — Discharge Instructions (Addendum)
Your x-rays are negative for fracture or dislocation. RICE therapy, rest, ice, compression, and elevation while symptoms persist. Apply ice for 20 minutes, remove for 1 hour, then repeat.  Do this is much as possible over the next 48 hours to help with pain and swelling. Wear the postop shoe when you are engaging in prolonged walking or standing. Gentle range of motion exercises to the foot to help decrease recovery time. May take over-the-counter ibuprofen or Tylenol as needed for pain or discomfort. If symptoms do not improve within the next 2 to 4 weeks, recommend following up with orthopedics.  You may follow-up with [facility] by contacting them at [phone].

## 2021-10-17 NOTE — ED Triage Notes (Signed)
Right foot pain.  Thinks she stepped on a toy this morning.

## 2021-10-17 NOTE — ED Provider Notes (Signed)
[facility]    CSN: [id number] Arrival date & time: 10/17/21  1343      History   Chief Complaint No chief complaint on file.   HPI Kelly Watts is a 26 y.o. female.   The history is provided by the patient.   Patient presents for complaints of right foot pain that started after she was playing with her children this morning.  Patient states that she stepped on a toy and rolled her right ankle, causing the foot to "lock up".  Since that time she has had difficulty bearing weight, and decreased range of motion.  She has not taken any medication for her symptoms.  States that she does have a history of a fracture to the right foot.  Past Medical History:  Diagnosis Date   Angio-edema    Asthma    Eczema    Encounter for Nexplanon removal 11/16/2015   History of kidney stones    History of kidney stones    Kidney infection    Miscarriage    Nexplanon insertion 11/02/2012   Inserted left arm 11/02/12   Patient underweight    Recurrent upper respiratory infection (URI)    Urticaria     Patient Active Problem List   Diagnosis Date Noted   Breast disorder 08/29/2021   History of miscarriage 08/29/2021   Pregnancy test positive 08/29/2021   Encounter for Nexplanon removal 04/09/2021   General counseling and advice for contraceptive management 04/09/2021   Kidney stones 10/13/2016   Kidney cyst, acquired 10/13/2016    Past Surgical History:  Procedure Laterality Date   DILATION AND CURETTAGE OF UTERUS N/A 08/06/2021   Procedure: SUCTION DILATATION AND CURETTAGE;  Surgeon: Myna Hidalgo, DO;  Location: [facility];  Service: Gynecology;  Laterality: N/A;  pt knows to be NPO, will arrive at 10:00 - no PAT   EAR TUBE REMOVAL     EXTRACORPOREAL SHOCK WAVE LITHOTRIPSY Left 10/30/2016   Procedure: LEFT EXTRACORPOREAL SHOCK WAVE LITHOTRIPSY (ESWL);  Surgeon: Crist Fat, MD;  Location: [facility];  Service: Urology;  Laterality: Left;   TYMPANOSTOMY TUBE PLACEMENT      WISDOM TOOTH EXTRACTION      OB History     Gravida  4   Para  3   Term  2   Preterm  1   AB      Living  2      SAB      IAB      Ectopic      Multiple  0   Live Births  2            Home Medications    Prior to Admission medications   Medication Sig Start Date End Date Taking? Authorizing Provider  ibuprofen (ADVIL) 600 MG tablet Take 1 tablet (600 mg total) by mouth every 6 (six) hours as needed. 08/06/21   Myna Hidalgo, DO    Family History Family History  Problem Relation Age of Onset   Allergic rhinitis Father    Asthma Mother    Allergic rhinitis Mother    Coronary artery disease Paternal Grandfather    Cancer Paternal Grandmother        breast   Diabetes Maternal Grandmother    Thyroid disease Maternal Grandmother    Heart disease Maternal Grandmother    Melanoma Maternal Grandfather    Allergic rhinitis Brother    Allergic rhinitis Brother    Angioedema Neg Hx    Atopy Neg  Hx    Eczema Neg Hx    Immunodeficiency Neg Hx    Urticaria Neg Hx     Social History Social History   Tobacco Use   Smoking status: Former    Packs/day: 0.25    Years: 3.00    Total pack years: 0.75    Types: Cigarettes    Quit date: 01/30/2016    Years since quitting: 5.7   Smokeless tobacco: Never  Vaping Use   Vaping Use: Never used  Substance Use Topics   Alcohol use: Yes    Comment: occ   Drug use: No     Allergies   Penicillins and Sulfa antibiotics   Review of Systems Review of Systems Per HPI  Physical Exam Triage Vital Signs ED Triage Vitals  Enc Vitals Group     BP 10/17/21 1415 102/67     Pulse Rate 10/17/21 1415 94     Resp 10/17/21 1415 18     Temp 10/17/21 1415 98 F (36.7 C)     Temp Source 10/17/21 1415 Oral     SpO2 10/17/21 1415 97 %     Weight --      Height --      Head Circumference --      Peak Flow --      Pain Score 10/17/21 1416 8     Pain Loc --      Pain Edu? --      Excl. in GC? --    No data  found.  Updated Vital Signs BP 102/67 (BP Location: Left Arm)   Pulse 94   Temp 98 F (36.7 C) (Oral)   Resp 18   LMP 10/10/2021 (Exact Date)   SpO2 97%   Visual Acuity Right Eye Distance:   Left Eye Distance:   Bilateral Distance:    Right Eye Near:   Left Eye Near:    Bilateral Near:     Physical Exam Vitals and nursing note reviewed.  Constitutional:      General: She is not in acute distress.    Appearance: Normal appearance.  HENT:     Head: Normocephalic.  Eyes:     Extraocular Movements: Extraocular movements intact.     Pupils: Pupils are equal, round, and reactive to light.  Pulmonary:     Effort: Pulmonary effort is normal.  Musculoskeletal:     Right foot: Decreased range of motion. Normal capillary refill. Tenderness (Dorsal aspect of right foot) present. No swelling or deformity. Normal pulse.  Skin:    General: Skin is warm and dry.  Neurological:     Mental Status: She is alert.      UC Treatments / Results  Labs (all labs ordered are listed, but only abnormal results are displayed) Labs Reviewed - No data to display  EKG   Radiology DG Foot Complete Right  Result Date: 10/17/2021 CLINICAL DATA:  Stepped on a toy with lateral foot pain EXAM: RIGHT FOOT COMPLETE - 3+ VIEW COMPARISON:  None Available. FINDINGS: There is no evidence of fracture or dislocation. There is no evidence of arthropathy or other focal bone abnormality. Soft tissues are unremarkable. IMPRESSION: Negative. Electronically Signed   By: Paulina Fusi M.D.   On: 10/17/2021 14:34    Procedures Procedures (including critical care time)  Medications Ordered in UC Medications - No data to display  Initial Impression / Assessment and Plan / UC Course  I have reviewed the triage vital signs and the nursing notes.  Pertinent labs & imaging results that were available during my care of the patient were reviewed by me and considered in my medical decision making (see chart for  details).  Patient presents for complaints of right foot pain after stepping on a toy earlier today.  On exam, there is no bruising, ecchymosis, or obvious deformity.  Patient has decreased range of motion and tenderness to the dorsal aspect of the foot.  X-rays are reassuring showing no fracture or dislocation.  Supportive care recommendations were provided to the patient along with providing a postop shoe to help with support.  RICE therapy was recommended.  Patient advised to follow-up with orthopedics if her symptoms do not improve within the next 2 to 4 weeks. Final Clinical Impressions(s) / UC Diagnoses   Final diagnoses:  Sprain of right foot, initial encounter     Discharge Instructions      Your x-rays are negative for fracture or dislocation. RICE therapy, rest, ice, compression, and elevation while symptoms persist. Apply ice for 20 minutes, remove for 1 hour, then repeat.  Do this is much as possible over the next 48 hours to help with pain and swelling. Wear the postop shoe when you are engaging in prolonged walking or standing. Gentle range of motion exercises to the foot to help decrease recovery time. May take over-the-counter ibuprofen or Tylenol as needed for pain or discomfort. If symptoms do not improve within the next 2 to 4 weeks, recommend following up with orthopedics.  You may follow-up with [facility] by contacting them at [phone].     ED Prescriptions   None    PDMP not reviewed this encounter.   Abran Cantor, NP 10/17/21 1502

## 2021-10-29 ENCOUNTER — Encounter: Payer: Self-pay | Admitting: Adult Health

## 2021-10-29 ENCOUNTER — Ambulatory Visit (INDEPENDENT_AMBULATORY_CARE_PROVIDER_SITE_OTHER): Payer: Managed Care, Other (non HMO) | Admitting: Adult Health

## 2021-10-29 VITALS — BP 114/77 | HR 92 | Ht 63.0 in | Wt 137.5 lb

## 2021-10-29 DIAGNOSIS — Z01419 Encounter for gynecological examination (general) (routine) without abnormal findings: Secondary | ICD-10-CM | POA: Diagnosis not present

## 2021-10-29 NOTE — Progress Notes (Signed)
Patient ID: Kelly Watts, female   DOB: January 31, 1996, 26 y.o.   MRN: [id number] History of Present Illness: Kelly Watts is a 26 year old white female, married, [id number] in for a well woman gyn exam.  Lab Results  Component Value Date   DIAGPAP  10/24/2020    - Negative for intraepithelial lesion or malignancy (NILM)   HPVHIGH Negative 10/24/2020    Current Medications, Allergies, Past Medical History, Past Surgical History, Family History and Social History were reviewed in [employer] record.     Review of Systems: Patient denies any headaches, hearing loss, fatigue, blurred vision, shortness of breath, chest pain, abdominal pain, problems with bowel movements, urination, or intercourse. No joint pain or mood swings.     Physical Exam:BP 114/77 (BP Location: Left Arm, Patient Position: Sitting, Cuff Size: Normal)   Pulse 92   Ht 5\' 3"  (1.6 m)   Wt 137 lb 8 oz (62.4 kg)   LMP 10/10/2021 (Exact Date)   Breastfeeding No   BMI 24.36 kg/m   General:  Well developed, well nourished, no acute distress Skin:  Warm and dry Neck:  Midline trachea, normal thyroid, good ROM, no lymphadenopathy Lungs; Clear to auscultation bilaterally Breast:  No dominant palpable mass, retraction, or nipple discharge Cardiovascular: Regular rate and rhythm Abdomen:  Soft, non tender, no hepatosplenomegaly Pelvic:  External genitalia is normal in appearance, no lesions.  The vagina is normal in appearance. Urethra has no lesions or masses. The cervix is bulbous.  Uterus is felt to be normal size, shape, and contour.  No adnexal masses or tenderness noted.Bladder is non tender, no masses felt. Rectal: Deferred Extremities/musculoskeletal:  No swelling or varicosities noted, no clubbing or cyanosis,has walking shoe right foot, ?ligament issues Psych:  No mood changes, alert and cooperative,seems happy AA is 1 Fall risk is low    10/29/2021   11:00 AM 10/29/2021   10:55 AM 12/05/2019     9:16 AM  Depression screen PHQ 2/9  Decreased Interest 0 0 0  Down, Depressed, Hopeless 0 0 0  PHQ - 2 Score 0 0 0  Altered sleeping 0 0 0  Tired, decreased energy 0 0 0  Change in appetite 0 0 0  Feeling bad or failure about yourself  0 0 0  Trouble concentrating 0 0 0  Moving slowly or fidgety/restless 0 0 0  Suicidal thoughts 0 0 0  PHQ-9 Score 0 0 0       10/29/2021   10:55 AM 12/05/2019    9:16 AM 08/24/2019    3:37 PM  GAD 7 : Generalized Anxiety Score  Nervous, Anxious, on Edge 0 0 0  Control/stop worrying 0 0 0  Worry too much - different things 0 0 0  Trouble relaxing 0 0 0  Restless 0 0 0  Easily annoyed or irritable 0 0 0  Afraid - awful might happen 0 0 0  Total GAD 7 Score 0 0 0  Anxiety Difficulty   Not difficult at all    Upstream - 10/29/21 1059       Pregnancy Intention Screening   Does the patient want to become pregnant in the next year? Ok Either Way    Does the patient's partner want to become pregnant in the next year? Ok Either Way    Would the patient like to discuss contraceptive options today? No      Contraception Wrap Up   Current Method Female Condom    End  Method Female Condom              Examination chaperoned by Malachy Mood LPN  Impression and Plan: 1. Encounter for well woman exam with routine gynecological exam Physical in 1 year Pap in 2025 Will check fasting labs  - CBC - TSH - Lipid panel - Comprehensive metabolic panel

## 2021-10-30 LAB — CBC
Hematocrit: 40.4 % (ref 34.0–46.6)
Hemoglobin: 13.5 g/dL (ref 11.1–15.9)
MCH: 29.2 pg (ref 26.6–33.0)
MCHC: 33.4 g/dL (ref 31.5–35.7)
MCV: 87 fL (ref 79–97)
Platelets: 338 10*3/uL (ref 150–450)
RBC: 4.63 x10E6/uL (ref 3.77–5.28)
RDW: 12.2 % (ref 11.7–15.4)
WBC: 9.9 10*3/uL (ref 3.4–10.8)

## 2021-10-30 LAB — COMPREHENSIVE METABOLIC PANEL
ALT: 18 IU/L (ref 0–32)
AST: 16 IU/L (ref 0–40)
Albumin/Globulin Ratio: 1.6 (ref 1.2–2.2)
Albumin: 4.5 g/dL (ref 3.9–5.0)
Alkaline Phosphatase: 113 IU/L (ref 44–121)
BUN/Creatinine Ratio: 18 (ref 9–23)
BUN: 13 mg/dL (ref 6–20)
Bilirubin Total: 0.4 mg/dL (ref 0.0–1.2)
CO2: 24 mmol/L (ref 20–29)
Calcium: 9.5 mg/dL (ref 8.7–10.2)
Chloride: 100 mmol/L (ref 96–106)
Creatinine, Ser: 0.74 mg/dL (ref 0.57–1.00)
Globulin, Total: 2.8 g/dL (ref 1.5–4.5)
Glucose: 90 mg/dL (ref 70–99)
Potassium: 4.5 mmol/L (ref 3.5–5.2)
Sodium: 138 mmol/L (ref 134–144)
Total Protein: 7.3 g/dL (ref 6.0–8.5)
eGFR: 114 mL/min/{1.73_m2} (ref >59)

## 2021-10-30 LAB — LIPID PANEL
Chol/HDL Ratio: 3.1 ratio (ref 0.0–4.4)
Cholesterol, Total: 175 mg/dL (ref 100–199)
HDL: 57 mg/dL (ref >39)
LDL Chol Calc (NIH): 101 mg/dL — ABNORMAL HIGH (ref 0–99)
Triglycerides: 92 mg/dL (ref 0–149)
VLDL Cholesterol Cal: 17 mg/dL (ref 5–40)

## 2021-10-30 LAB — TSH: TSH: 0.908 u[IU]/mL (ref 0.450–4.500)

## 2021-11-02 DIAGNOSIS — Z419 Encounter for procedure for purposes other than remedying health state, unspecified: Secondary | ICD-10-CM | POA: Diagnosis not present

## 2021-12-03 DIAGNOSIS — Z419 Encounter for procedure for purposes other than remedying health state, unspecified: Secondary | ICD-10-CM | POA: Diagnosis not present

## 2022-01-03 DIAGNOSIS — Z419 Encounter for procedure for purposes other than remedying health state, unspecified: Secondary | ICD-10-CM | POA: Diagnosis not present

## 2022-02-02 DIAGNOSIS — Z419 Encounter for procedure for purposes other than remedying health state, unspecified: Secondary | ICD-10-CM | POA: Diagnosis not present

## 2022-02-25 ENCOUNTER — Ambulatory Visit (INDEPENDENT_AMBULATORY_CARE_PROVIDER_SITE_OTHER): Payer: Managed Care, Other (non HMO) | Admitting: Adult Health

## 2022-02-25 ENCOUNTER — Encounter: Payer: Self-pay | Admitting: Adult Health

## 2022-02-25 VITALS — BP 95/64 | HR 77 | Ht 63.0 in | Wt 144.0 lb

## 2022-02-25 DIAGNOSIS — Z3201 Encounter for pregnancy test, result positive: Secondary | ICD-10-CM | POA: Diagnosis not present

## 2022-02-25 DIAGNOSIS — O3680X0 Pregnancy with inconclusive fetal viability, not applicable or unspecified: Secondary | ICD-10-CM

## 2022-02-25 DIAGNOSIS — Z3A01 Less than 8 weeks gestation of pregnancy: Secondary | ICD-10-CM

## 2022-02-25 DIAGNOSIS — Z8759 Personal history of other complications of pregnancy, childbirth and the puerperium: Secondary | ICD-10-CM

## 2022-02-25 LAB — POCT URINE PREGNANCY: Preg Test, Ur: POSITIVE — AB

## 2022-02-25 NOTE — Progress Notes (Signed)
  Subjective:     Patient ID: Kelly Watts, female   DOB: Jun 19, 1995, 26 y.o.   MRN: [id number]  HPI Venesha is a 26 year old white female,married, [id number] in for UPT, had +HPT. She has history of a miscarriage.     Component Value Date/Time   DIAGPAP  10/24/2020 0856    - Negative for intraepithelial lesion or malignancy (NILM)   DIAGPAP  01/20/2017 0000    NEGATIVE FOR INTRAEPITHELIAL LESIONS OR MALIGNANCY.   [place] Negative 10/24/2020 0856   ADEQPAP  10/24/2020 0856    Satisfactory for evaluation; transformation zone component PRESENT.   ADEQPAP  01/20/2017 0000    Satisfactory for evaluation  endocervical/transformation zone component PRESENT.    Review of Systems +missed period +HPT Reviewed past medical,surgical, social and family history. Reviewed medications and allergies.     Objective:   Physical Exam BP 95/64 (BP Location: Left Arm, Patient Position: Sitting, Cuff Size: Normal)   Pulse 77   Ht 5\' 3"  (1.6 m)   Wt 144 lb (65.3 kg)   LMP 01/22/2022   Breastfeeding No   BMI 25.51 kg/m  UPT is +, about 4+6 weeks by LMP with EDD 10/28/24. Skin warm and dry. Lungs: clear to ausculation bilaterally. Cardiovascular: regular rate and rhythm.    Fall risk is low  Upstream - 02/25/22 0911       Pregnancy Intention Screening   Does the patient want to become pregnant in the next year? N/A    Does the patient's partner want to become pregnant in the next year? N/A    Would the patient like to discuss contraceptive options today? N/A      Contraception Wrap Up   Current Method Pregnant/Seeking Pregnancy    End Method Pregnant/Seeking Pregnancy    Contraception Counseling Provided No    How was the end contraceptive method provided? N/A             Assessment:     1. Pregnancy examination or test, positive result Take OTC PNV Gummies Will check labs  - POCT urine pregnancy - Beta hCG quant (ref lab) - Progesterone  2. History of miscarriage  3.  Encounter to determine fetal viability of pregnancy, single or unspecified fetus Dating [place] in 2-3 weeks  - US OB Comp Less 14 Wks; Future  4. Less than [redacted] weeks gestation of pregnancy Review handout by [facility]     Plan:    Will talk when  labs back Dating [place] in 2-3 weeks

## 2022-02-26 LAB — PROGESTERONE: Progesterone: 24.9 ng/mL

## 2022-02-26 LAB — BETA HCG QUANT (REF LAB): hCG Quant: 4507 m[IU]/mL

## 2022-03-05 DIAGNOSIS — Z419 Encounter for procedure for purposes other than remedying health state, unspecified: Secondary | ICD-10-CM | POA: Diagnosis not present

## 2022-03-06 ENCOUNTER — Telehealth: Payer: Self-pay

## 2022-03-06 NOTE — Telephone Encounter (Signed)
Left patient message about moving her dating to 11/15 at 12:00 pm.

## 2022-03-12 ENCOUNTER — Other Ambulatory Visit: Payer: Self-pay | Admitting: Adult Health

## 2022-03-12 MED ORDER — PROMETHAZINE HCL 12.5 MG PO TABS: 12.5000 mg | ORAL_TABLET | Freq: Four times a day (QID) | ORAL | 2 refills | Status: DC | PRN

## 2022-03-12 NOTE — Progress Notes (Signed)
Will rx phenergan  

## 2022-03-19 ENCOUNTER — Ambulatory Visit (INDEPENDENT_AMBULATORY_CARE_PROVIDER_SITE_OTHER): Payer: Managed Care, Other (non HMO)

## 2022-03-19 DIAGNOSIS — O3680X0 Pregnancy with inconclusive fetal viability, not applicable or unspecified: Secondary | ICD-10-CM

## 2022-03-19 NOTE — Progress Notes (Signed)
[place] 8 wks,single IUP with yolk sac,subchorionic hemorrhage 2.7 x 3.4 x .8 cm,CRL 16.8 mm,FHR 178 bpm

## 2022-03-21 ENCOUNTER — Ambulatory Visit: Payer: Managed Care, Other (non HMO) | Admitting: *Deleted

## 2022-03-21 ENCOUNTER — Other Ambulatory Visit: Payer: Managed Care, Other (non HMO)

## 2022-04-04 DIAGNOSIS — Z419 Encounter for procedure for purposes other than remedying health state, unspecified: Secondary | ICD-10-CM | POA: Diagnosis not present

## 2022-04-16 ENCOUNTER — Other Ambulatory Visit: Payer: Self-pay | Admitting: Obstetrics & Gynecology

## 2022-04-16 DIAGNOSIS — Z3682 Encounter for antenatal screening for nuchal translucency: Secondary | ICD-10-CM

## 2022-04-16 DIAGNOSIS — Z3490 Encounter for supervision of normal pregnancy, unspecified, unspecified trimester: Secondary | ICD-10-CM | POA: Insufficient documentation

## 2022-04-17 ENCOUNTER — Encounter: Payer: Self-pay | Admitting: Advanced Practice Midwife

## 2022-04-17 ENCOUNTER — Ambulatory Visit: Payer: Managed Care, Other (non HMO)

## 2022-04-17 ENCOUNTER — Ambulatory Visit (INDEPENDENT_AMBULATORY_CARE_PROVIDER_SITE_OTHER): Payer: Managed Care, Other (non HMO) | Admitting: Advanced Practice Midwife

## 2022-04-17 ENCOUNTER — Encounter: Payer: Managed Care, Other (non HMO) | Admitting: *Deleted

## 2022-04-17 VITALS — BP 115/70 | HR 80 | Wt 135.0 lb

## 2022-04-17 DIAGNOSIS — Z3A12 12 weeks gestation of pregnancy: Secondary | ICD-10-CM

## 2022-04-17 DIAGNOSIS — Z3480 Encounter for supervision of other normal pregnancy, unspecified trimester: Secondary | ICD-10-CM

## 2022-04-17 DIAGNOSIS — Z3682 Encounter for antenatal screening for nuchal translucency: Secondary | ICD-10-CM

## 2022-04-17 DIAGNOSIS — J4520 Mild intermittent asthma, uncomplicated: Secondary | ICD-10-CM

## 2022-04-17 DIAGNOSIS — J45909 Unspecified asthma, uncomplicated: Secondary | ICD-10-CM | POA: Insufficient documentation

## 2022-04-17 MED ORDER — ASPIRIN 81 MG PO TBEC: 162.0000 mg | DELAYED_RELEASE_TABLET | Freq: Every day | ORAL | 6 refills | Status: DC

## 2022-04-17 NOTE — Progress Notes (Signed)
INITIAL OBSTETRICAL VISIT Patient name: AMILLIANA HAYWORTH MRN [id number]  Date of birth: Nov 15, 1995 Chief Complaint:   Initial Prenatal Visit  History of Present Illness:   KAYELYNN ABDOU is a 26 y.o. [id number] Caucasian female at [redacted]w[redacted]d by LMP c/w u/s at 8 weeks with an Estimated Date of Delivery: 10/29/22 being seen today for her initial obstetrical visit.   Her obstetrical history is significant for 22 week loss d/t PPROM>39 week SVD>early SAB>40 week SVD>early SAB.   Today she reports waking up in the middle of the night and can't go back to sleep--may try unisom (have beside bed to take when wakes up at night).     04/17/2022    2:39 PM 10/29/2021   11:00 AM 10/29/2021   10:55 AM 12/05/2019    9:16 AM 08/24/2019    3:37 PM  Depression screen PHQ 2/9  Decreased Interest 0 0 0 0 0  Down, Depressed, Hopeless 0 0 0 0 0  PHQ - 2 Score 0 0 0 0 0  Altered sleeping 3 0 0 0 0  Tired, decreased energy 0 0 0 0 1  Change in appetite 0 0 0 0 0  Feeling bad or failure about yourself  0 0 0 0 0  Trouble concentrating 0 0 0 0 0  Moving slowly or fidgety/restless 0 0 0 0 0  Suicidal thoughts 0 0 0 0 0  PHQ-9 Score 3 0 0 0 1  Difficult doing work/chores     Not difficult at all    Patient's last menstrual period was 01/22/2022. Last pap 10/24/20. Results were: normal Review of Systems:   Pertinent items are noted in HPI Denies cramping/contractions, leakage of fluid, vaginal bleeding, abnormal vaginal discharge w/ itching/odor/irritation, headaches, visual changes, shortness of breath, chest pain, abdominal pain, severe nausea/vomiting, or problems with urination or bowel movements unless otherwise stated above.  Pertinent History Reviewed:  Reviewed past medical,surgical, social, obstetrical and family history.  Reviewed problem list, medications and allergies. OB History  Gravida Para Term Preterm AB Living  6 3 2 1 2 2   SAB IAB Ectopic Multiple Live Births  2     0 2    # Outcome Date GA  Lbr Len/2nd Weight Sex Delivery Anes PTL Lv  6 Current           5 SAB 07/2021          4 Term 03/07/20 [redacted]w[redacted]d 03:50 / 00:06 5 lb 14.9 oz (2.69 kg) F Vag-Spont EPI N LIV  3 SAB 2020          2 Term 08/11/17 [redacted]w[redacted]d 15:19 / 01:00 7 lb 12.5 oz (3.53 kg) F Vag-Spont EPI N LIV  1 Preterm 07/25/12 [redacted]w[redacted]d 07:35 / 00:04 15 oz (0.425 kg) F Vag-Spont None  FD   Physical Assessment:   Vitals:   04/17/22 1453  BP: 115/70  Pulse: 80  Weight: 135 lb (61.2 kg)  Body mass index is 23.91 kg/m.       Physical Examination:  General appearance - well appearing, and in no distress  Mental status - alert, oriented to person, place, and time  Psych:  She has a normal mood and affect  Skin - warm and dry, normal color, no suspicious lesions noted  Chest - effort normal  Heart - normal rate and regular rhythm  Abdomen - soft, nontender  Extremities:  No swelling or varicosities noted   TODAY'S NT   04/19/22 12+1 wks,measurements c/w dates,FHR  165 bpm,NB present,NT 1.2 mm,anterior placenta, CRL 55.16 mm      No results found for this or any previous visit (from the past 24 hour(s)).  Assessment & Plan:  1) Low-Risk Pregnancy [id number] at [redacted]w[redacted]d with an Estimated Date of Delivery: 10/29/22   2) Initial OB visit  3) 22 week loss and SAB X2:  ASA  Meds: No orders of the defined types were placed in this encounter.   Initial labs obtained Continue prenatal vitamins Reviewed n/v relief measures and warning s/s to report Reviewed recommended weight gain based on pre-gravid BMI Encouraged well-balanced diet Genetic & carrier screening discussed: requests Panorama, NT/IT, and Horizon , declines AFP Ultrasound discussed; fetal survey: requested CCNC completed> form faxed if has or is planning to apply for medicaid The nature of [facility] [employer] with multiple MDs and other Advanced Practice Providers was explained to patient; also emphasized that fellows, residents, and students are  part of our team. Given home bp cuff.. Check bp weekly, let us know if >140/90.        Scarlette Calico Cresenzo-Dishmon 3:05 PM

## 2022-04-17 NOTE — Progress Notes (Signed)
[place] 12+1 wks,measurements c/w dates,FHR 165 bpm,NB present,NT 1.2 mm,anterior placenta, CRL 55.16 mm

## 2022-04-17 NOTE — Patient Instructions (Signed)
Kelly Watts, I greatly value your feedback.  If you receive a survey following your visit with [place] today, we appreciate you taking the time to fill it out.  Thanks, Kelly Beams, DNP, CNM  [facility] HAS MOVED!!! It is now [facility] at [facility] ([address] [place], [place] [address]) Entrance located off of [address] Free 24/7 valet parking   Nausea & Vomiting Have saltine crackers or pretzels by your bed and eat a few bites before you raise your head out of bed in the morning Eat small frequent meals throughout the day instead of large meals Drink plenty of fluids throughout the day to stay hydrated, just don't drink a lot of fluids with your meals.  This can make your stomach fill up faster making you feel sick Do not brush your teeth right after you eat Products with real ginger are good for nausea, like ginger ale and ginger hard candy Make sure it says made with real ginger! Sucking on sour candy like lemon heads is also good for nausea If your prenatal vitamins make you nauseated, take them at night so you will sleep through the nausea Sea Bands If you feel like you need medicine for the nausea & vomiting please let us know If you are unable to keep any fluids or food down please let us know   Constipation Drink plenty of fluid, preferably water, throughout the day Eat foods high in fiber such as fruits, vegetables, and grains Exercise, such as walking, is a good way to keep your bowels regular Drink warm fluids, especially warm prune juice, or decaf coffee Eat a 1/2 cup of real oatmeal (not instant), 1/2 cup applesauce, and 1/2-1 cup warm prune juice every day If needed, you may take Colace (docusate sodium) stool softener once or twice a day to help keep the stool soft.  If you still are having problems with constipation, you may take Miralax once daily as needed to help keep your bowels regular.   Home Blood Pressure Monitoring for  Patients   Your provider has recommended that you check your blood pressure (BP) at least once a week at home. If you do not have a blood pressure cuff at home, one will be provided for you. Contact your provider if you have not received your monitor within 1 week.   Helpful Tips for Accurate Home Blood Pressure Checks  Don't smoke, exercise, or drink caffeine 30 minutes before checking your BP Use the restroom before checking your BP (a full bladder can raise your pressure) Relax in a comfortable upright chair Feet on the ground Left arm resting comfortably on a flat surface at the level of your heart Legs uncrossed Back supported Sit quietly and don't talk Place the cuff on your bare arm Adjust snuggly, so that only two fingertips can fit between your skin and the top of the cuff Check 2 readings separated by at least one minute Keep a log of your BP readings For a visual, please reference this diagram: http://ccnc.care/bpdiagram  Provider Name: [facility]     Phone: [phone]  Zone 1: ALL CLEAR  Continue to monitor your symptoms:  BP reading is less than 140 (top number) or less than 90 (bottom number)  No right upper stomach pain No headaches or seeing spots No feeling nauseated or throwing up No swelling in face and hands  Zone 2: CAUTION Call your doctor's office for any of the following:  BP reading is greater than 140 (top number) or greater than 90 (bottom number)  Stomach pain under your ribs in the middle or right side Headaches or seeing spots Feeling nauseated or throwing up Swelling in face and hands  Zone 3: EMERGENCY  Seek immediate medical care if you have any of the following:  BP reading is greater than160 (top number) or greater than 110 (bottom number) Severe headaches not improving with Tylenol Serious difficulty catching your breath Any worsening symptoms from Zone 2    First Trimester of Pregnancy The first trimester of pregnancy is from  week 1 until the end of week 12 (months 1 through 3). A week after a sperm fertilizes an egg, the egg will implant on the wall of the uterus. This embryo will begin to develop into a baby. Genes from you and your partner are forming the baby. The female genes determine whether the baby is a boy or a girl. At 6-8 weeks, the eyes and face are formed, and the heartbeat can be seen on ultrasound. At the end of 12 weeks, all the baby's organs are formed.  Now that you are pregnant, you will want to do everything you can to have a healthy baby. Two of the most important things are to get good prenatal care and to follow your health care provider's instructions. Prenatal care is all the medical care you receive before the baby's birth. This care will help prevent, find, and treat any problems during the pregnancy and childbirth. BODY CHANGES Your body goes through many changes during pregnancy. The changes vary from woman to woman.  You may gain or lose a couple of pounds at first. You may feel sick to your stomach (nauseous) and throw up (vomit). If the vomiting is uncontrollable, call your health care provider. You may tire easily. You may develop headaches that can be relieved by medicines approved by your health care provider. You may urinate more often. Painful urination may mean you have a bladder infection. You may develop heartburn as a result of your pregnancy. You may develop constipation because certain hormones are causing the muscles that push waste through your intestines to slow down. You may develop hemorrhoids or swollen, bulging veins (varicose veins). Your breasts may begin to grow larger and become tender. Your nipples may stick out more, and the tissue that surrounds them (areola) may become darker. Your gums may bleed and may be sensitive to brushing and flossing. Dark spots or blotches (chloasma, mask of pregnancy) may develop on your face. This will likely fade after the baby is  born. Your menstrual periods will stop. You may have a loss of appetite. You may develop cravings for certain kinds of food. You may have changes in your emotions from day to day, such as being excited to be pregnant or being concerned that something may go wrong with the pregnancy and baby. You may have more vivid and strange dreams. You may have changes in your hair. These can include thickening of your hair, rapid growth, and changes in texture. Some women also have hair loss during or after pregnancy, or hair that feels dry or thin. Your hair will most likely return to normal after your baby is born. WHAT TO EXPECT AT YOUR PRENATAL VISITS During a routine prenatal visit: You will be weighed to make sure you and the baby are growing normally. Your blood pressure will be taken. Your abdomen will be measured to track your baby's growth. The fetal  heartbeat will be listened to starting around week 10 or 12 of your pregnancy. Test results from any previous visits will be discussed. Your health care provider may ask you: How you are feeling. If you are feeling the baby move. If you have had any abnormal symptoms, such as leaking fluid, bleeding, severe headaches, or abdominal cramping. If you have any questions. Other tests that may be performed during your first trimester include: Blood tests to find your blood type and to check for the presence of any previous infections. They will also be used to check for low iron levels (anemia) and Rh antibodies. Later in the pregnancy, blood tests for diabetes will be done along with other tests if problems develop. Urine tests to check for infections, diabetes, or protein in the urine. An ultrasound to confirm the proper growth and development of the baby. An amniocentesis to check for possible genetic problems. Fetal screens for spina bifida and Down syndrome. You may need other tests to make sure you and the baby are doing well. HOME CARE  INSTRUCTIONS  Medicines Follow your health care provider's instructions regarding medicine use. Specific medicines may be either safe or unsafe to take during pregnancy. Take your prenatal vitamins as directed. If you develop constipation, try taking a stool softener if your health care provider approves. Diet Eat regular, well-balanced meals. Choose a variety of foods, such as meat or vegetable-based protein, fish, milk and low-fat dairy products, vegetables, fruits, and whole grain breads and cereals. Your health care provider will help you determine the amount of weight gain that is right for you. Avoid raw meat and uncooked cheese. These carry germs that can cause birth defects in the baby. Eating four or five small meals rather than three large meals a day may help relieve nausea and vomiting. If you start to feel nauseous, eating a few soda crackers can be helpful. Drinking liquids between meals instead of during meals also seems to help nausea and vomiting. If you develop constipation, eat more high-fiber foods, such as fresh vegetables or fruit and whole grains. Drink enough fluids to keep your urine clear or pale yellow. Activity and Exercise Exercise only as directed by your health care provider. Exercising will help you: Control your weight. Stay in shape. Be prepared for labor and delivery. Experiencing pain or cramping in the lower abdomen or low back is a good sign that you should stop exercising. Check with your health care provider before continuing normal exercises. Try to avoid standing for long periods of time. Move your legs often if you must stand in one place for a long time. Avoid heavy lifting. Wear low-heeled shoes, and practice good posture. You may continue to have sex unless your health care provider directs you otherwise. Relief of Pain or Discomfort Wear a good support bra for breast tenderness.   Take warm sitz baths to soothe any pain or discomfort caused by  hemorrhoids. Use hemorrhoid cream if your health care provider approves.   Rest with your legs elevated if you have leg cramps or low back pain. If you develop varicose veins in your legs, wear support hose. Elevate your feet for 15 minutes, 3-4 times a day. Limit salt in your diet. Prenatal Care Schedule your prenatal visits by the twelfth week of pregnancy. They are usually scheduled monthly at first, then more often in the last 2 months before delivery. Write down your questions. Take them to your prenatal visits. Keep all your prenatal visits as directed  by your health care provider. Safety Wear your seat belt at all times when driving. Make a list of emergency phone numbers, including numbers for family, friends, the hospital, and police and fire departments. General Tips Ask your health care provider for a referral to a local prenatal education class. Begin classes no later than at the beginning of month 6 of your pregnancy. Ask for help if you have counseling or nutritional needs during pregnancy. Your health care provider can offer advice or refer you to specialists for help with various needs. Do not use hot tubs, steam rooms, or saunas. Do not douche or use tampons or scented sanitary pads. Do not cross your legs for long periods of time. Avoid cat litter boxes and soil used by cats. These carry germs that can cause birth defects in the baby and possibly loss of the fetus by miscarriage or stillbirth. Avoid all smoking, herbs, alcohol, and medicines not prescribed by your health care provider. Chemicals in these affect the formation and growth of the baby. Schedule a dentist appointment. At home, brush your teeth with a soft toothbrush and be gentle when you floss. SEEK MEDICAL CARE IF:  You have dizziness. You have mild pelvic cramps, pelvic pressure, or nagging pain in the abdominal area. You have persistent nausea, vomiting, or diarrhea. You have a bad smelling vaginal  discharge. You have pain with urination. You notice increased swelling in your face, hands, legs, or ankles. SEEK IMMEDIATE MEDICAL CARE IF:  You have a fever. You are leaking fluid from your vagina. You have spotting or bleeding from your vagina. You have severe abdominal cramping or pain. You have rapid weight gain or loss. You vomit blood or material that looks like coffee grounds. You are exposed to [place] measles and have never had them. You are exposed to fifth disease or chickenpox. You develop a severe headache. You have shortness of breath. You have any kind of trauma, such as from a fall or a car accident. Document Released: 04/15/2001 Document Revised: 09/05/2013 Document Reviewed: 03/01/2013 [facility] Patient Information 2015 [place], [place]. This information is not intended to replace advice given to you by your health care provider. Make sure you discuss any questions you have with your health care provider.  Coronavirus (COVID-19) Are you at risk?  Are you at risk for the Coronavirus (COVID-19)?  To be considered HIGH RISK for Coronavirus (COVID-19), you have to meet the following criteria:  Traveled to [place], [place], [place], [place] or [place];  and have fever, cough, and shortness of breath within the last 2 weeks of travel OR Been in close contact with a person diagnosed with COVID-19 within the last 2 weeks and have fever, cough, and shortness of breath IF YOU DO NOT MEET THESE CRITERIA, YOU ARE CONSIDERED LOW RISK FOR COVID-19.  What to do if you are HIGH RISK for COVID-19?  If you are having a medical emergency, call 911. Seek medical care right away. Before you go to a doctor's office, urgent care or emergency department, call ahead and tell them about your recent travel, contact with someone diagnosed with COVID-19, and your symptoms. You should receive instructions from your physician's office regarding next steps of care.  When you arrive at healthcare provider,  tell the healthcare staff immediately you have returned from visiting [place], [place], [place], [place] or [place]; in the last two weeks or you have been in close contact with a person diagnosed with COVID-19 in the last 2 weeks.  Tell the health care staff about your symptoms: fever, cough and shortness of breath. After you have been seen by a medical provider, you will be either: Tested for (COVID-19) and discharged home on quarantine except to seek medical care if symptoms worsen, and asked to  Stay home and avoid contact with others until you get your results (4-5 days)  Avoid travel on public transportation if possible (such as bus, train, or airplane) or Sent to the Emergency Department by EMS for evaluation, COVID-19 testing, and possible admission depending on your condition and test results.  What to do if you are LOW RISK for COVID-19?  Reduce your risk of any infection by using the same precautions used for avoiding the common cold or flu:  Wash your hands often with soap and warm water for at least 20 seconds.  If soap and water are not readily available, use an alcohol-based hand sanitizer with at least 60% alcohol.  If coughing or sneezing, cover your mouth and nose by coughing or sneezing into the elbow areas of your shirt or coat, into a tissue or into your sleeve (not your hands). Avoid shaking hands with others and consider head nods or verbal greetings only. Avoid touching your eyes, nose, or mouth with unwashed hands.  Avoid close contact with people who are sick. Avoid places or events with large numbers of people in one location, like concerts or sporting events. Carefully consider travel plans you have or are making. If you are planning any travel outside or inside the [place], visit the CDC's Travelers' Health webpage for the latest health notices. If you have some symptoms but not all symptoms, continue to monitor at home and seek medical attention if your symptoms worsen. If  you are having a medical emergency, call 911.   ADDITIONAL HEALTHCARE OPTIONS FOR [place] / e-Visit: [email or web address]         [facility]: [facility]: [id number]                   [facility]: [phone]     Safe Medications in Pregnancy   Acne: Benzoyl Peroxide Salicylic Acid  Backache/Headache: Tylenol: 2 regular strength every 4 hours OR              2 Extra strength every 6 hours  Colds/Coughs/Allergies: Benadryl (alcohol free) 25 mg every 6 hours as needed Breath right strips Claritin Cepacol throat lozenges Chloraseptic throat spray Cold-Eeze- up to three times per day Cough drops, alcohol free Flonase (by prescription only) Guaifenesin Mucinex Robitussin DM (plain only, alcohol free) Saline nasal spray/drops Sudafed (pseudoephedrine) & Actifed ** use only after [redacted] weeks gestation and if you do not have high blood pressure Tylenol Vicks Vaporub Zinc lozenges Zyrtec   Constipation: Colace Ducolax suppositories Fleet enema Glycerin suppositories Metamucil Milk of magnesia Miralax Senokot Smooth move tea  Diarrhea: Kaopectate Imodium A-D  *NO pepto Bismol  Hemorrhoids: Anusol Anusol HC Preparation H Tucks  Indigestion: Tums Maalox Mylanta Zantac  Pepcid  Insomnia: Benadryl (alcohol free) 36m every 6 hours as needed Tylenol PM Unisom, no Gelcaps  Leg Cramps: Tums MagGel  Nausea/Vomiting:  Bonine Dramamine Emetrol Ginger extract Sea bands Meclizine  Nausea medication to take during pregnancy:  Unisom (doxylamine succinate 25 mg tablets) Take one tablet daily at bedtime. If symptoms are not adequately controlled, the dose can be increased to a maximum recommended dose of two tablets daily (1/2 tablet in  the morning, 1/2 tablet mid-afternoon and one at bedtime). Vitamin B6 160m tablets. Take one  tablet twice a day (up to 200 mg per day).  Skin Rashes: Aveeno products Benadryl cream or 250mevery 6 hours as needed Calamine Lotion 1% cortisone cream  Yeast infection: Gyne-lotrimin 7 Monistat 7   **If taking multiple medications, please check labels to avoid duplicating the same active ingredients **take medication as directed on the label ** Do not exceed 4000 mg of tylenol in 24 hours **Do not take medications that contain aspirin or ibuprofen

## 2022-04-19 LAB — CBC/D/PLT+RPR+RH+ABO+RUBIGG...
Antibody Screen: NEGATIVE
Basophils Absolute: 0 10*3/uL (ref 0.0–0.2)
Basos: 0 %
EOS (ABSOLUTE): 0.1 10*3/uL (ref 0.0–0.4)
Eos: 1 %
HCV Ab: NONREACTIVE
HIV Screen 4th Generation wRfx: NONREACTIVE
Hematocrit: 37.7 % (ref 34.0–46.6)
Hemoglobin: 12.9 g/dL (ref 11.1–15.9)
Hepatitis B Surface Ag: NEGATIVE
Immature Grans (Abs): 0 10*3/uL (ref 0.0–0.1)
Immature Granulocytes: 0 %
Lymphocytes Absolute: 2.4 10*3/uL (ref 0.7–3.1)
Lymphs: 23 %
MCH: 29.7 pg (ref 26.6–33.0)
MCHC: 34.2 g/dL (ref 31.5–35.7)
MCV: 87 fL (ref 79–97)
Monocytes Absolute: 0.7 10*3/uL (ref 0.1–0.9)
Monocytes: 6 %
Neutrophils Absolute: 7.2 10*3/uL — ABNORMAL HIGH (ref 1.4–7.0)
Neutrophils: 70 %
Platelets: 318 10*3/uL (ref 150–450)
RBC: 4.34 x10E6/uL (ref 3.77–5.28)
RDW: 12.2 % (ref 11.7–15.4)
RPR Ser Ql: NONREACTIVE
Rh Factor: NEGATIVE
Rubella Antibodies, IGG: 2.3 index (ref >0.99)
WBC: 10.4 10*3/uL (ref 3.4–10.8)

## 2022-04-19 LAB — INTEGRATED 1
Crown Rump Length: 55.2 mm
Gest. Age on Collection Date: 12 weeks
Maternal Age at EDD: 27.2 yr
Nuchal Translucency (NT): 1.2 mm
Number of Fetuses: 1
PAPP-A Value: 1305.5 ng/mL
Weight: 135 [lb_av]

## 2022-04-19 LAB — HCV INTERPRETATION

## 2022-04-22 LAB — URINE CULTURE

## 2022-04-23 ENCOUNTER — Encounter: Payer: Self-pay | Admitting: Advanced Practice Midwife

## 2022-04-25 LAB — PANORAMA PRENATAL TEST FULL PANEL:PANORAMA TEST PLUS 5 ADDITIONAL MICRODELETIONS: FETAL FRACTION: 11.7

## 2022-05-01 ENCOUNTER — Ambulatory Visit: Admit: 2022-05-01 | Payer: Managed Care, Other (non HMO)

## 2022-05-02 ENCOUNTER — Telehealth: Payer: Managed Care, Other (non HMO) | Admitting: Nurse Practitioner

## 2022-05-02 DIAGNOSIS — R399 Unspecified symptoms and signs involving the genitourinary system: Secondary | ICD-10-CM | POA: Diagnosis not present

## 2022-05-02 MED ORDER — NITROFURANTOIN MONOHYD MACRO 100 MG PO CAPS: 100.0000 mg | ORAL_CAPSULE | Freq: Two times a day (BID) | ORAL | 0 refills | Status: DC

## 2022-05-02 NOTE — Progress Notes (Signed)

## 2022-05-02 NOTE — Progress Notes (Signed)
Patient [redacted] weeks gestation and macrobid is safe in 2nd trimester Patient allergic to PCN and Sulfa Cross allergy with Keflex and PCN  Kelly Daphine Deutscher, FNP

## 2022-05-05 NOTE — L&D Delivery Note (Signed)
[facility] Delivery Note  Kelly Watts is a 27 y.o. [id number] s/p vag delivery at [redacted]w[redacted]d. She was admitted for SOL.   ROM: 7h 48m with clear fluid GBS Status: neg Maximum Maternal Temperature: 98.4  Labor Progress: Kelly Watts presented in active labor with SROM; she progressed to complete with a small amount of Pitocin and delivered with a couple of pushes.  Delivery Date/Time: June 23rd, 2024 at 0722 Delivery: Called to room and patient was complete. She pushed with a couple of ctx and head delivered direct OA. Nuchal cord present x 1- delivered with somersault. Shoulder and body delivered in usual fashion. Infant with spontaneous cry, placed on mother's abdomen, dried and stimulated. Cord clamped x 2 after 1-minute delay, and cut by FOB. Cord blood drawn. Placenta delivered spontaneously with gentle cord traction. Fundus firm with massage and Pitocin. Labia, perineum, vagina, and cervix inspected and found to be intact.   Placenta: spont, intact; to L&D Complications: none Lacerations: none EBL: 54cc Analgesia: epidural  Postpartum Planning [x]  message to sent to schedule follow-up   Infant: girl  APGARs 9/9  3005g (6lb 10oz)  Kelly Watts, CNM  10/26/2022 8:03 AM

## 2022-05-08 NOTE — Progress Notes (Signed)
Had to forward note

## 2022-05-15 ENCOUNTER — Ambulatory Visit (INDEPENDENT_AMBULATORY_CARE_PROVIDER_SITE_OTHER): Payer: Managed Care, Other (non HMO) | Admitting: Advanced Practice Midwife

## 2022-05-15 VITALS — BP 102/65 | HR 88 | Wt 134.0 lb

## 2022-05-15 DIAGNOSIS — Z3A16 16 weeks gestation of pregnancy: Secondary | ICD-10-CM

## 2022-05-15 DIAGNOSIS — Z3480 Encounter for supervision of other normal pregnancy, unspecified trimester: Secondary | ICD-10-CM

## 2022-05-15 DIAGNOSIS — Z8759 Personal history of other complications of pregnancy, childbirth and the puerperium: Secondary | ICD-10-CM | POA: Insufficient documentation

## 2022-05-15 NOTE — Patient Instructions (Signed)
Kelly Watts, I greatly value your feedback.  If you receive a survey following your visit with [place] today, we appreciate you taking the time to fill it out.  Thanks, Nigel Berthold, CNM     [place]!!! It is now [facility] ([place], [place] [address]) Entrance located off of [address] parking   Go to [email or web address] to register for FREE online childbirth classes    Second Trimester of Pregnancy The second trimester is from week 14 through week 27 (months 4 through 6). The second trimester is often a time when you feel your best. Your body has adjusted to being pregnant, and you begin to feel better physically. Usually, morning sickness has lessened or quit completely, you may have more energy, and you may have an increase in appetite. The second trimester is also a time when the fetus is growing rapidly. At the end of the sixth month, the fetus is about 9 inches long and weighs about 1 pounds. You will likely begin to feel the baby move (quickening) between 16 and 20 weeks of pregnancy. Body changes during your second trimester Your body continues to go through many changes during your second trimester. The changes vary from woman to woman. Your weight will continue to increase. You will notice your lower abdomen bulging out. You may begin to get stretch marks on your hips, abdomen, and breasts. You may develop headaches that can be relieved by medicines. The medicines should be approved by your health care provider. You may urinate more often because the fetus is pressing on your bladder. You may develop or continue to have heartburn as a result of your pregnancy. You may develop constipation because certain hormones are causing the muscles that push waste through your intestines to slow down. You may develop hemorrhoids or swollen, bulging veins (varicose veins). You may have back pain. This is  caused by: Weight gain. Pregnancy hormones that are relaxing the joints in your pelvis. A shift in weight and the muscles that support your balance. Your breasts will continue to grow and they will continue to become tender. Your gums may bleed and may be sensitive to brushing and flossing. Dark spots or blotches (chloasma, mask of pregnancy) may develop on your face. This will likely fade after the baby is born. A dark line from your belly button to the pubic area (linea nigra) may appear. This will likely fade after the baby is born. You may have changes in your hair. These can include thickening of your hair, rapid growth, and changes in texture. Some women also have hair loss during or after pregnancy, or hair that feels dry or thin. Your hair will most likely return to normal after your baby is born.  What to expect at prenatal visits During a routine prenatal visit: You will be weighed to make sure you and the fetus are growing normally. Your blood pressure will be taken. Your abdomen will be measured to track your baby's growth. The fetal heartbeat will be listened to. Any test results from the previous visit will be discussed.  Your health care provider may ask you: How you are feeling. If you are feeling the baby move. If you have had any abnormal symptoms, such as leaking fluid, bleeding, severe headaches, or abdominal cramping. If you are using any tobacco products, including cigarettes, chewing tobacco, and electronic cigarettes. If you have any questions.  Other tests  that may be performed during your second trimester include: Blood tests that check for: Low iron levels (anemia). High blood sugar that affects pregnant women (gestational diabetes) between 54 and 28 weeks. Rh antibodies. This is to check for a protein on red blood cells (Rh factor). Urine tests to check for infections, diabetes, or protein in the urine. An ultrasound to confirm the proper growth and  development of the baby. An amniocentesis to check for possible genetic problems. Fetal screens for spina bifida and Down syndrome. HIV (human immunodeficiency virus) testing. Routine prenatal testing includes screening for HIV, unless you choose not to have this test.  Follow these instructions at home: Medicines Follow your health care provider's instructions regarding medicine use. Specific medicines may be either safe or unsafe to take during pregnancy. Take a prenatal vitamin that contains at least 600 micrograms (mcg) of folic acid. If you develop constipation, try taking a stool softener if your health care provider approves. Eating and drinking Eat a balanced diet that includes fresh fruits and vegetables, whole grains, good sources of protein such as meat, eggs, or tofu, and low-fat dairy. Your health care provider will help you determine the amount of weight gain that is right for you. Avoid raw meat and uncooked cheese. These carry germs that can cause birth defects in the baby. If you have low calcium intake from food, talk to your health care provider about whether you should take a daily calcium supplement. Limit foods that are high in fat and processed sugars, such as fried and sweet foods. To prevent constipation: Drink enough fluid to keep your urine clear or pale yellow. Eat foods that are high in fiber, such as fresh fruits and vegetables, whole grains, and beans. Activity Exercise only as directed by your health care provider. Most women can continue their usual exercise routine during pregnancy. Try to exercise for 30 minutes at least 5 days a week. Stop exercising if you experience uterine contractions. Avoid heavy lifting, wear low heel shoes, and practice good posture. A sexual relationship may be continued unless your health care provider directs you otherwise. Relieving pain and discomfort Wear a good support bra to prevent discomfort from breast tenderness. Take  warm sitz baths to soothe any pain or discomfort caused by hemorrhoids. Use hemorrhoid cream if your health care provider approves. Rest with your legs elevated if you have leg cramps or low back pain. If you develop varicose veins, wear support hose. Elevate your feet for 15 minutes, 3-4 times a day. Limit salt in your diet. Prenatal Care Write down your questions. Take them to your prenatal visits. Keep all your prenatal visits as told by your health care provider. This is important. Safety Wear your seat belt at all times when driving. Make a list of emergency phone numbers, including numbers for family, friends, the hospital, and police and fire departments. General instructions Ask your health care provider for a referral to a local prenatal education class. Begin classes no later than the beginning of month 6 of your pregnancy. Ask for help if you have counseling or nutritional needs during pregnancy. Your health care provider can offer advice or refer you to specialists for help with various needs. Do not use hot tubs, steam rooms, or saunas. Do not douche or use tampons or scented sanitary pads. Do not cross your legs for long periods of time. Avoid cat litter boxes and soil used by cats. These carry germs that can cause birth defects in the baby  and possibly loss of the fetus by miscarriage or stillbirth. Avoid all smoking, herbs, alcohol, and unprescribed drugs. Chemicals in these products can affect the formation and growth of the baby. Do not use any products that contain nicotine or tobacco, such as cigarettes and e-cigarettes. If you need help quitting, ask your health care provider. Visit your dentist if you have not gone yet during your pregnancy. Use a soft toothbrush to brush your teeth and be gentle when you floss. Contact a health care provider if: You have dizziness. You have mild pelvic cramps, pelvic pressure, or nagging pain in the abdominal area. You have persistent  nausea, vomiting, or diarrhea. You have a bad smelling vaginal discharge. You have pain when you urinate. Get help right away if: You have a fever. You are leaking fluid from your vagina. You have spotting or bleeding from your vagina. You have severe abdominal cramping or pain. You have rapid weight gain or weight loss. You have shortness of breath with chest pain. You notice sudden or extreme swelling of your face, hands, ankles, feet, or legs. You have not felt your baby move in over an hour. You have severe headaches that do not go away when you take medicine. You have vision changes. Summary The second trimester is from week 14 through week 27 (months 4 through 6). It is also a time when the fetus is growing rapidly. Your body goes through many changes during pregnancy. The changes vary from woman to woman. Avoid all smoking, herbs, alcohol, and unprescribed drugs. These chemicals affect the formation and growth your baby. Do not use any tobacco products, such as cigarettes, chewing tobacco, and e-cigarettes. If you need help quitting, ask your health care provider. Contact your health care provider if you have any questions. Keep all prenatal visits as told by your health care provider. This is important. This information is not intended to replace advice given to you by your health care provider. Make sure you discuss any questions you have with your health care provider.

## 2022-05-15 NOTE — Progress Notes (Signed)
   LOW-RISK PREGNANCY VISIT Patient name: Kelly Watts MRN [id number]  Date of birth: [birth year] Chief Complaint:   Routine Prenatal Visit  History of Present Illness:   Kelly Watts is a 27 y.o. [id number] female at [redacted]w[redacted]d with an Estimated Date of Delivery: 10/29/22 being seen today for ongoing management of a low-risk pregnancy.  Today she reports no complaints. Contractions: Not present. Vag. Bleeding: None.   . denies leaking of fluid. Review of Systems:   Pertinent items are noted in HPI Denies abnormal vaginal discharge w/ itching/odor/irritation, headaches, visual changes, shortness of breath, chest pain, abdominal pain, severe nausea/vomiting, or problems with urination or bowel movements unless otherwise stated above. Pertinent History Reviewed:  Reviewed past medical,surgical, social, obstetrical and family history.  Reviewed problem list, medications and allergies. Physical Assessment:   Vitals:   05/15/22 1155  BP: 102/65  Pulse: 88  Weight: 134 lb (60.8 kg)  Body mass index is 23.74 kg/m.        Physical Examination:   General appearance: Well appearing, and in no distress  Mental status: Alert, oriented to person, place, and time  Skin: Warm & dry  Cardiovascular: Normal heart rate noted  Respiratory: Normal respiratory effort, no distress  Abdomen: Soft, gravid, nontender  Pelvic: Cervical exam deferred         Extremities: Edema: None  Fetal Status: Fetal Heart Rate (bpm): 152        Chaperone:  N/A    No results found for this or any previous visit (from the past 24 hour(s)).  Assessment & Plan:  1) Low-risk pregnancy [id number] at [redacted]w[redacted]d with an Estimated Date of Delivery: 10/29/22   2) Hx IUFD (PPROM) @ 22 weeks, no recommended surveillance.  Discussed possible vaginal pg (conflicting data supporting efficacy in women w/o a short cx, but did it last time, won't cause harm).  Pt declines   Meds: No orders of the defined types were placed in this  encounter.  Labs/procedures today: 2nd IT  Plan:  Continue routine obstetrical care  Next visit: prefers will be in person for anatomy scan     Reviewed: general obstetric precautions including but not limited to vaginal bleeding, contractions, leaking of fluid and fetal movement were reviewed in detail with the patient.  All questions were answered. Has home bp cuff. Check bp weekly, let us know if >140/90.   Follow-up: Return for As scheduled.  Future Appointments  Date Time Provider [place]  06/12/2022  9:15 AM [facility] [place] [facility] None  06/12/2022 10:10 AM Chancy Milroy, MD [facility]    Orders Placed This Encounter  Procedures   INTEGRATED 2   [name] DNP, CNM 05/15/2022 12:15 PM

## 2022-05-17 LAB — INTEGRATED 2
AFP MoM: 1.55
Alpha-Fetoprotein: 52.8 ng/mL
Crown Rump Length: 55.2 mm
DIA MoM: 1.18
DIA Value: 196 pg/mL
Estriol, Unconjugated: 1.45 ng/mL
Gest. Age on Collection Date: 12 weeks
Gestational Age: 16 weeks
Maternal Age at EDD: 27.2 yr
Nuchal Translucency (NT): 1.2 mm
Nuchal Translucency MoM: 0.97
Number of Fetuses: 1
PAPP-A MoM: 1.37
PAPP-A Value: 1305.5 ng/mL
Test Results:: NEGATIVE
Weight: 135 [lb_av]
Weight: 135 [lb_av]
hCG MoM: 1.95
hCG Value: 78.6 IU/mL
uE3 MoM: 1.51

## 2022-05-30 ENCOUNTER — Telehealth: Payer: Self-pay | Admitting: Obstetrics & Gynecology

## 2022-05-30 NOTE — Telephone Encounter (Signed)
Patient wants to know what she can take for a runny nose. Please advise.

## 2022-06-02 ENCOUNTER — Other Ambulatory Visit: Payer: Self-pay | Admitting: Advanced Practice Midwife

## 2022-06-02 DIAGNOSIS — Z363 Encounter for antenatal screening for malformations: Secondary | ICD-10-CM

## 2022-06-03 ENCOUNTER — Ambulatory Visit (INDEPENDENT_AMBULATORY_CARE_PROVIDER_SITE_OTHER): Payer: Managed Care, Other (non HMO)

## 2022-06-03 ENCOUNTER — Ambulatory Visit (INDEPENDENT_AMBULATORY_CARE_PROVIDER_SITE_OTHER): Payer: Managed Care, Other (non HMO) | Admitting: Obstetrics & Gynecology

## 2022-06-03 ENCOUNTER — Encounter: Payer: Self-pay | Admitting: Obstetrics & Gynecology

## 2022-06-03 VITALS — BP 102/62 | HR 73 | Wt 133.0 lb

## 2022-06-03 DIAGNOSIS — Z363 Encounter for antenatal screening for malformations: Secondary | ICD-10-CM

## 2022-06-03 DIAGNOSIS — Z3A18 18 weeks gestation of pregnancy: Secondary | ICD-10-CM

## 2022-06-03 DIAGNOSIS — Z8759 Personal history of other complications of pregnancy, childbirth and the puerperium: Secondary | ICD-10-CM

## 2022-06-03 DIAGNOSIS — Z3480 Encounter for supervision of other normal pregnancy, unspecified trimester: Secondary | ICD-10-CM

## 2022-06-03 DIAGNOSIS — Z3482 Encounter for supervision of other normal pregnancy, second trimester: Secondary | ICD-10-CM

## 2022-06-03 NOTE — Progress Notes (Signed)
   LOW-RISK PREGNANCY VISIT Patient name: Kelly Watts MRN [id number]  Date of birth: 06-12-1995 Chief Complaint:   Routine Prenatal Visit  History of Present Illness:   Kelly Watts is a 27 y.o. [id number] female at [redacted]w[redacted]d with an Estimated Date of Delivery: 10/29/22 being seen today for ongoing management of a low-risk pregnancy.     04/17/2022    2:39 PM 10/29/2021   11:00 AM 10/29/2021   10:55 AM 12/05/2019    9:16 AM 08/24/2019    3:37 PM  Depression screen PHQ 2/9  Decreased Interest 0 0 0 0 0  Down, Depressed, Hopeless 0 0 0 0 0  PHQ - 2 Score 0 0 0 0 0  Altered sleeping 3 0 0 0 0  Tired, decreased energy 0 0 0 0 1  Change in appetite 0 0 0 0 0  Feeling bad or failure about yourself  0 0 0 0 0  Trouble concentrating 0 0 0 0 0  Moving slowly or fidgety/restless 0 0 0 0 0  Suicidal thoughts 0 0 0 0 0  PHQ-9 Score 3 0 0 0 1  Difficult doing work/chores     Not difficult at all    Today she reports no complaints. Contractions: Not present. Vag. Bleeding: None.  Movement: Present. denies leaking of fluid. Review of Systems:   Pertinent items are noted in HPI Denies abnormal vaginal discharge w/ itching/odor/irritation, headaches, visual changes, shortness of breath, chest pain, abdominal pain, severe nausea/vomiting, or problems with urination or bowel movements unless otherwise stated above. Pertinent History Reviewed:  Reviewed past medical,surgical, social, obstetrical and family history.  Reviewed problem list, medications and allergies. Physical Assessment:   Vitals:   06/03/22 1214  BP: 102/62  Pulse: 73  Weight: 133 lb (60.3 kg)  Body mass index is 23.56 kg/m.        Physical Examination:   General appearance: Well appearing, and in no distress  Mental status: Alert, oriented to person, place, and time  Skin: Warm & dry  Cardiovascular: Normal heart rate noted  Respiratory: Normal respiratory effort, no distress  Abdomen: Soft, gravid, nontender  Pelvic:  Cervical exam deferred         Extremities: Edema: None  Fetal Status:     Movement: Present    Chaperone: n/a    No results found for this or any previous visit (from the past 24 hour(s)).  Assessment & Plan:  1) Low-risk pregnancy [id number] at [redacted]w[redacted]d with an Estimated Date of Delivery: 10/29/22   2) normal sonogram,    Meds: No orders of the defined types were placed in this encounter.  Labs/procedures today:   Plan:  Continue routine obstetrical care  Next visit: prefers in person    Reviewed: Preterm labor symptoms and general obstetric precautions including but not limited to vaginal bleeding, contractions, leaking of fluid and fetal movement were reviewed in detail with the patient.  All questions were answered. Has home bp cuff. Rx faxed to . Check bp weekly, let us know if >140/90.   Follow-up: Return in about 4 weeks (around 07/01/2022) for [place].  No orders of the defined types were placed in this encounter.   Florian Buff, MD 06/03/2022 12:29 PM

## 2022-06-03 NOTE — Progress Notes (Signed)
[place] 18+4 wks,breech,anterior placenta gr 0,normal ovaries,SVP of fluid 3.1 cm,FHR 161 bpm,cx 4.9 cm,EFW 240 g 37%,anatomy complete,no obvious abnormalities

## 2022-06-09 ENCOUNTER — Ambulatory Visit (INDEPENDENT_AMBULATORY_CARE_PROVIDER_SITE_OTHER): Payer: Managed Care, Other (non HMO) | Admitting: Family Medicine

## 2022-06-09 ENCOUNTER — Encounter: Payer: Self-pay | Admitting: Family Medicine

## 2022-06-09 VITALS — BP 90/58 | HR 76 | Temp 97.7°F | Ht 63.0 in | Wt 133.0 lb

## 2022-06-09 DIAGNOSIS — N390 Urinary tract infection, site not specified: Secondary | ICD-10-CM

## 2022-06-09 DIAGNOSIS — R109 Unspecified abdominal pain: Secondary | ICD-10-CM

## 2022-06-09 DIAGNOSIS — O2342 Unspecified infection of urinary tract in pregnancy, second trimester: Secondary | ICD-10-CM

## 2022-06-09 DIAGNOSIS — O26899 Other specified pregnancy related conditions, unspecified trimester: Secondary | ICD-10-CM | POA: Insufficient documentation

## 2022-06-09 LAB — URINALYSIS, ROUTINE W REFLEX MICROSCOPIC
Bilirubin Urine: NEGATIVE
Glucose, UA: NEGATIVE
Hyaline Cast: NONE SEEN /LPF
Ketones, ur: NEGATIVE
Nitrite: NEGATIVE
Specific Gravity, Urine: 1.025 (ref 1.001–1.035)
pH: 6.5 (ref 5.0–8.0)

## 2022-06-09 LAB — MICROSCOPIC MESSAGE

## 2022-06-09 MED ORDER — NITROFURANTOIN MONOHYD MACRO 100 MG PO CAPS: 100.0000 mg | ORAL_CAPSULE | Freq: Two times a day (BID) | ORAL | 0 refills | Status: DC

## 2022-06-09 NOTE — Progress Notes (Signed)
New Patient Office Visit  Subjective    Patient ID: Kelly Watts, female    DOB: 1995/08/30  Age: 27 y.o. MRN: [id number]  CC:  Chief Complaint  Patient presents with   Establish Care    re-establish care/kidney issues (former patient of Dr. Buelah Manis and NP Noemi Chapel) - [facility] pls send pt for Urine sample      HPI Kelly Watts presents to establish care. Oriented to practice routines and expectations. She has a PMH of kidney stones with 2 prior pregnancies, 1 miscarriage, and is currently [redacted]w[redacted]d pregnant. She has been seeing her OBGYN only for medical needs, no recent PCP needs.   Concerns include intermittent left flank pain since last Tuesday that was worse Thursday, improved Friday, and worse again Saturday ongoing through today. It is worse today, rated 4/10 currently and sharp. She does report blood in her urine once on Friday overnight that has not recurred. Accompanied by loss of appetite, vomiting x1 this AM, and urinary frequency. Denies dysuria, fever, night sweats, cold chills, vaginal irritation, vaginal discharge.  Her [place] last Tuesday was normal. Has tried Tylenol without much relief.  Cervical CA: 10/24/20 normal, repeat 2027 Vaccines UTD  Outpatient Encounter Medications as of 06/09/2022  Medication Sig   aspirin EC 81 MG tablet Take 2 tablets (162 mg total) by mouth daily.   nitrofurantoin, macrocrystal-monohydrate, (MACROBID) 100 MG capsule Take 1 capsule (100 mg total) by mouth 2 (two) times daily.   Prenatal Vit-Fe Fumarate-FA (PRENATAL VITAMIN PO) Take by mouth.   [DISCONTINUED] promethazine (PHENERGAN) 12.5 MG tablet Take 1 tablet (12.5 mg total) by mouth every 6 (six) hours as needed for nausea or vomiting. (Patient not taking: Reported on 06/03/2022)   No facility-administered encounter medications on file as of 06/09/2022.    Past Medical History:  Diagnosis Date   Angio-edema    Asthma    Eczema    Encounter for Nexplanon removal 11/16/2015    History of kidney stones    History of kidney stones    Kidney infection    Miscarriage    Nexplanon insertion 11/02/2012   Inserted left arm 11/02/12   Patient underweight    Recurrent upper respiratory infection (URI)    Urticaria     Past Surgical History:  Procedure Laterality Date   DILATION AND CURETTAGE OF UTERUS N/A 08/06/2021   Procedure: SUCTION DILATATION AND CURETTAGE;  Surgeon: Janyth Pupa, DO;  Location: [facility];  Service: Gynecology;  Laterality: N/A;  pt knows to be NPO, will arrive at 10:00 - no PAT   EAR TUBE REMOVAL     EXTRACORPOREAL SHOCK WAVE LITHOTRIPSY Left 10/30/2016   Procedure: LEFT EXTRACORPOREAL SHOCK WAVE LITHOTRIPSY (ESWL);  Surgeon: Ardis Hughs, MD;  Location: [facility];  Service: Urology;  Laterality: Left;   TYMPANOSTOMY TUBE PLACEMENT     WISDOM TOOTH EXTRACTION      Family History  Problem Relation Age of Onset   Allergic rhinitis Father    Asthma Mother    Allergic rhinitis Mother    Coronary artery disease Paternal Grandfather    Cancer Paternal Grandmother        breast   Diabetes Maternal Grandmother    Thyroid disease Maternal Grandmother    Heart disease Maternal Grandmother    Melanoma Maternal Grandfather    Allergic rhinitis Brother    Allergic rhinitis Brother    Angioedema Neg Hx    Atopy Neg Hx    Eczema Neg Hx  Immunodeficiency Neg Hx    Urticaria Neg Hx     Social History   Socioeconomic History   Marital status: Married    Spouse name: Kelly Watts   Number of children: 1   Years of education: Not on file   Highest education level: Not on file  Occupational History   Not on file  Tobacco Use   Smoking status: Former    Packs/day: 0.25    Years: 3.00    Total pack years: 0.75    Types: Cigarettes    Quit date: 01/30/2016    Years since quitting: 6.3   Smokeless tobacco: Never  Vaping Use   Vaping Use: Never used  Substance and Sexual Activity   Alcohol use: Not Currently    Comment: occ   Drug use:  No   Sexual activity: Yes    Birth control/protection: None  Other Topics Concern   Not on file  Social History Narrative   Not on file   Social Determinants of Health   Financial Resource Strain: Low Risk  (10/29/2021)   Overall Financial Resource Strain (CARDIA)    Difficulty of Paying Living Expenses: Not hard at all  Food Insecurity: No Food Insecurity (10/29/2021)   Hunger Vital Sign    Worried About Running Out of Food in the Last Year: Never true    Ran Out of Food in the Last Year: Never true  Transportation Needs: No Transportation Needs (10/29/2021)   PRAPARE - Hydrologist (Medical): No    Lack of Transportation (Non-Medical): No  Physical Activity: Sufficiently Active (10/29/2021)   Exercise Vital Sign    Days of Exercise per Week: 7 days    Minutes of Exercise per Session: 30 min  Stress: No Stress Concern Present (10/29/2021)   [place]    Feeling of Stress : Not at all  Social Connections: Moderately Integrated (10/29/2021)   Social Connection and Isolation Panel [NHANES]    Frequency of Communication with Friends and Family: More than three times a week    Frequency of Social Gatherings with Friends and Family: More than three times a week    Attends Religious Services: 1 to 4 times per year    Active Member of [employer] or Organizations: No    Attends Archivist Meetings: Never    Marital Status: Married  Human resources officer Violence: Not At Risk (10/29/2021)   Humiliation, Afraid, Rape, and Kick questionnaire    Fear of Current or Ex-Partner: No    Emotionally Abused: No    Physically Abused: No    Sexually Abused: No    Review of Systems  Constitutional: Negative.   HENT: Negative.    Eyes: Negative.   Respiratory: Negative.    Cardiovascular: Negative.   Gastrointestinal:  Positive for vomiting.  Genitourinary:  Positive for flank pain, frequency and  hematuria.  Skin: Negative.   Neurological: Negative.   Endo/Heme/Allergies: Negative.   Psychiatric/Behavioral: Negative.    All other systems reviewed and are negative.       Objective    BP (!) 90/58   Pulse 76   Temp 97.7 F (36.5 C) (Oral)   Ht 5\' 3"  (1.6 m)   Wt 133 lb (60.3 kg)   LMP 01/22/2022   SpO2 95%   BMI 23.56 kg/m   Physical Exam Vitals and nursing note reviewed.  Constitutional:      Appearance: Normal appearance. She is  normal weight.  HENT:     Head: Normocephalic and atraumatic.     Right Ear: Tympanic membrane, ear canal and external ear normal.     Left Ear: Tympanic membrane, ear canal and external ear normal.     Nose: Nose normal.     Mouth/Throat:     Mouth: Mucous membranes are moist.     Pharynx: Oropharynx is clear.  Eyes:     Extraocular Movements: Extraocular movements intact.     Conjunctiva/sclera: Conjunctivae normal.     Pupils: Pupils are equal, round, and reactive to light.  Cardiovascular:     Rate and Rhythm: Normal rate and regular rhythm.     Pulses: Normal pulses.     Heart sounds: Normal heart sounds.  Pulmonary:     Effort: Pulmonary effort is normal.     Breath sounds: Normal breath sounds.  Abdominal:     General: Bowel sounds are normal.     Palpations: Abdomen is soft.     Tenderness: There is right CVA tenderness and left CVA tenderness.  Musculoskeletal:        General: Normal range of motion.     Cervical back: Normal range of motion and neck supple.  Skin:    General: Skin is warm and dry.     Capillary Refill: Capillary refill takes less than 2 seconds.  Neurological:     General: No focal deficit present.     Mental Status: She is alert and oriented to person, place, and time. Mental status is at baseline.  Psychiatric:        Mood and Affect: Mood normal.        Behavior: Behavior normal.        Thought Content: Thought content normal.        Judgment: Judgment normal.          Assessment &  Plan:   Problem List Items Addressed This Visit       Other   Flank pain in pregnant patient - Primary    UA positive, will treat with Macrobid 100mg  BID for 5 days. She has an [place] scheduled in AM with her OBGYN. Encouraged to proceed to ED overnight if flank pain becomes severe or for vaginal bleeding. May continue Tylenol for pain. If pain continues will obtain renal and pelvic US.      Relevant Orders   Urinalysis, Routine w reflex microscopic (Completed)   Urine Culture   Other Visit Diagnoses     Urinary tract infection in mother during second trimester of pregnancy       Relevant Medications   nitrofurantoin, macrocrystal-monohydrate, (MACROBID) 100 MG capsule       Return if symptoms worsen or fail to improve, for annual physical.   Rubie Maid, FNP

## 2022-06-09 NOTE — Assessment & Plan Note (Addendum)
UA positive, will treat with Macrobid 100mg  BID for 5 days. She has an [place] scheduled in AM with her OBGYN. Encouraged to proceed to ED overnight if flank pain becomes severe, vaginal bleeding, contractions, leaking of fluid, or changes in fetal kick counts. May continue Tylenol for pain. Will proceed with CMP and renal US for further evaluation.

## 2022-06-10 ENCOUNTER — Encounter: Payer: Self-pay | Admitting: *Deleted

## 2022-06-10 ENCOUNTER — Other Ambulatory Visit (INDEPENDENT_AMBULATORY_CARE_PROVIDER_SITE_OTHER): Payer: Managed Care, Other (non HMO) | Admitting: *Deleted

## 2022-06-10 DIAGNOSIS — Z87440 Personal history of urinary (tract) infections: Secondary | ICD-10-CM

## 2022-06-10 LAB — URINE CULTURE
MICRO NUMBER:: 14519147
Result:: NO GROWTH
SPECIMEN QUALITY:: ADEQUATE

## 2022-06-10 NOTE — Progress Notes (Signed)
Pt saw PCP yesterday and was put on antibiotics for UTI. Pt was advised to take med until all gone. Drink plenty of water. Keep scheduled appt. Pt voiced understanding. [place]

## 2022-06-11 NOTE — Addendum Note (Signed)
Addended by: Rubie Maid on: 06/11/2022 07:29 AM   Modules accepted: Orders

## 2022-06-12 ENCOUNTER — Encounter: Payer: Self-pay | Admitting: Family Medicine

## 2022-06-12 ENCOUNTER — Other Ambulatory Visit: Payer: Managed Care, Other (non HMO)

## 2022-06-12 ENCOUNTER — Encounter: Payer: Managed Care, Other (non HMO) | Admitting: Obstetrics and Gynecology

## 2022-06-12 NOTE — Telephone Encounter (Signed)
Hello, plse send this pt's referral  [phone] to [facility] per pt request  Thank you

## 2022-06-13 ENCOUNTER — Other Ambulatory Visit: Payer: Self-pay

## 2022-06-13 NOTE — Progress Notes (Signed)
06/13/22 Called, spoke w/Felicia at [facility] to change pt's referral location to [facility]. Per Solmon Ice, change has been done, referral is now showing [facility]

## 2022-06-18 ENCOUNTER — Inpatient Hospital Stay (HOSPITAL_COMMUNITY)
Admission: AD | Admit: 2022-06-18 | Discharge: 2022-06-18 | Disposition: A | Discharge status: Home / Self Care | Payer: Managed Care, Other (non HMO) | Attending: Obstetrics & Gynecology | Admitting: Obstetrics & Gynecology

## 2022-06-18 ENCOUNTER — Encounter (HOSPITAL_COMMUNITY): Payer: Self-pay | Admitting: Obstetrics & Gynecology

## 2022-06-18 ENCOUNTER — Inpatient Hospital Stay (HOSPITAL_COMMUNITY): Payer: Managed Care, Other (non HMO)

## 2022-06-18 DIAGNOSIS — Z3A21 21 weeks gestation of pregnancy: Secondary | ICD-10-CM | POA: Diagnosis not present

## 2022-06-18 DIAGNOSIS — Z87442 Personal history of urinary calculi: Secondary | ICD-10-CM | POA: Insufficient documentation

## 2022-06-18 DIAGNOSIS — R109 Unspecified abdominal pain: Secondary | ICD-10-CM | POA: Diagnosis not present

## 2022-06-18 DIAGNOSIS — O99891 Other specified diseases and conditions complicating pregnancy: Secondary | ICD-10-CM | POA: Diagnosis present

## 2022-06-18 DIAGNOSIS — O26892 Other specified pregnancy related conditions, second trimester: Secondary | ICD-10-CM | POA: Insufficient documentation

## 2022-06-18 DIAGNOSIS — Z87440 Personal history of urinary (tract) infections: Secondary | ICD-10-CM | POA: Diagnosis not present

## 2022-06-18 DIAGNOSIS — N132 Hydronephrosis with renal and ureteral calculous obstruction: Secondary | ICD-10-CM | POA: Insufficient documentation

## 2022-06-18 DIAGNOSIS — R319 Hematuria, unspecified: Secondary | ICD-10-CM

## 2022-06-18 LAB — URINALYSIS, ROUTINE W REFLEX MICROSCOPIC
Bilirubin Urine: NEGATIVE
Glucose, UA: NEGATIVE mg/dL
Ketones, ur: 5 mg/dL — AB
Leukocytes,Ua: NEGATIVE
Nitrite: NEGATIVE
Protein, ur: NEGATIVE mg/dL
RBC / HPF: >50 RBC/hpf (ref 0–5)
Specific Gravity, Urine: 1.008 (ref 1.005–1.030)
pH: 9 — ABNORMAL HIGH (ref 5.0–8.0)

## 2022-06-18 MED ORDER — HYDROMORPHONE HCL 1 MG/ML IJ SOLN
1.0000 mg | Freq: Once | INTRAMUSCULAR | Status: AC
  Administered 2022-06-18: 1 mg via INTRAVENOUS
  Filled 2022-06-18: qty 1

## 2022-06-18 MED ORDER — TAMSULOSIN HCL 0.4 MG PO CAPS
0.4000 mg | ORAL_CAPSULE | Freq: Once | ORAL | Status: AC
  Administered 2022-06-18: 0.4 mg via ORAL
  Filled 2022-06-18: qty 1

## 2022-06-18 MED ORDER — OXYCODONE-ACETAMINOPHEN 5-325 MG PO TABS: 1.0000 | ORAL_TABLET | ORAL | 0 refills | Status: DC | PRN

## 2022-06-18 MED ORDER — LACTATED RINGERS IV SOLN: Freq: Once | INTRAVENOUS | Status: AC

## 2022-06-18 MED ORDER — TAMSULOSIN HCL 0.4 MG PO CAPS: 0.4000 mg | ORAL_CAPSULE | Freq: Every day | ORAL | 0 refills | Status: DC

## 2022-06-18 MED ORDER — ONDANSETRON HCL 4 MG/2ML IJ SOLN
4.0000 mg | Freq: Once | INTRAMUSCULAR | Status: AC
  Administered 2022-06-18: 4 mg via INTRAVENOUS
  Filled 2022-06-18: qty 2

## 2022-06-18 NOTE — MAU Provider Note (Signed)
Chief Complaint:  No chief complaint on file.   Event Date/Time   First Provider Initiated Contact with Patient 06/18/22 0359     HPI: TOSHIANA DEWING is a 27 y.o. [id number] at 70w0dho presents to maternity admissions reporting left flank pain tonight.  Has a history of kidney stones during pregnancy.  Recently treated for UTI. She reports good fetal movement, denies LOF, vaginal bleeding, vaginal itching/burning, urinary symptoms, h/a, dizziness, n/v, diarrhea, constipation or fever/chills.  She denies headache, visual changes or RUQ abdominal pain.  Abdominal Pain This is a new problem. The current episode started today. The problem occurs constantly. The problem has been unchanged. The pain is located in the left flank. The pain is at a severity of 8/10. The quality of the pain is colicky, aching and sharp. Pertinent negatives include no constipation, diarrhea, dysuria, fever, frequency or nausea. Nothing aggravates the pain. The pain is relieved by Nothing. She has tried nothing for the symptoms.   RN Note: CNAVDEEP VOLOis a 27y.o. at 223w0dere in MAU reporting being awaken at 0100 with sharp, L flank pain. Hx stones and the pain feels like that from a [place]. All stones have been on the L. Has had one lithotripsy. Denies VB. States baby is breech and does not feel baby move a lot but baby's movement is her norm. Was treated for uti last wk  Onset of complaint: 0100              Pain score: 8  Past Medical History: Past Medical History:  Diagnosis Date   Angio-edema    Asthma    Eczema    Encounter for Nexplanon removal 11/16/2015   History of kidney stones    History of kidney stones    Kidney infection    Miscarriage    Nexplanon insertion 11/02/2012   Inserted left arm 11/02/12   Patient underweight    Recurrent upper respiratory infection (URI)    Urticaria     Past obstetric history: OB History  Gravida Para Term Preterm AB Living  6 3 2 1 2 2  $ SAB IAB Ectopic  Multiple Live Births  2     0 2    # Outcome Date GA Lbr Len/2nd Weight Sex Delivery Anes PTL Lv  6 Current           5 SAB 07/2021          4 Term 03/07/20 4028w0d:50 / 00:06 2690 g F Vag-Spont EPI N LIV  3 SAB 2020          2 Term 08/11/17 39w19w1d19 / 01:00 3530 g F Vag-Spont EPI N LIV  1 Preterm 07/25/12 22w325w3d5 / 00:04 425 g F Vag-Spont None  FD    Past Surgical History: Past Surgical History:  Procedure Laterality Date   DILATION AND CURETTAGE OF UTERUS N/A 08/06/2021   Procedure: SUCTION DILATATION AND CURETTAGE;  Surgeon: Ozan,Janyth Pupa  Location: [facility];  Service: Gynecology;  Laterality: N/A;  pt knows to be NPO, will arrive at 10:00 - no PAT   EAR TUBE REMOVAL     EXTRACORPOREAL SHOCK WAVE LITHOTRIPSY Left 10/30/2016   Procedure: LEFT EXTRACORPOREAL SHOCK WAVE LITHOTRIPSY (ESWL);  Surgeon: HerriArdis Hughs  Location: [facility];  Service: Urology;  Laterality: Left;   TYMPANOSTOMY TUBE PLACEMENT     WISDOM TOOTH EXTRACTION      Family History: Family History  Problem Relation Age of Onset  Allergic rhinitis Father    Asthma Mother    Allergic rhinitis Mother    Coronary artery disease Paternal Grandfather    Cancer Paternal Grandmother        breast   Diabetes Maternal Grandmother    Thyroid disease Maternal Grandmother    Heart disease Maternal Grandmother    Melanoma Maternal Grandfather    Allergic rhinitis Brother    Allergic rhinitis Brother    Angioedema Neg Hx    Atopy Neg Hx    Eczema Neg Hx    Immunodeficiency Neg Hx    Urticaria Neg Hx     Social History: Social History   Tobacco Use   Smoking status: Former    Packs/day: 0.25    Years: 3.00    Total pack years: 0.75    Types: Cigarettes    Quit date: 01/30/2016    Years since quitting: 6.3   Smokeless tobacco: Never  Vaping Use   Vaping Use: Never used  Substance Use Topics   Alcohol use: Not Currently    Comment: occ   Drug use: No    Allergies:  Allergies   Allergen Reactions   Penicillins Hives    Has patient had a PCN reaction causing immediate rash, facial/tongue/throat swelling, SOB or lightheadedness with hypotension: Yes Has patient had a PCN reaction causing severe rash involving mucus membranes or skin necrosis: No Has patient had a PCN reaction that required hospitalization: No Has patient had a PCN reaction occurring within the last 10 years: No If all of the above answers are "NO", then may proceed with Cephalosporin use.    Sulfa Antibiotics Hives    Meds:  Medications Prior to Admission  Medication Sig Dispense Refill Last Dose   aspirin EC 81 MG tablet Take 2 tablets (162 mg total) by mouth daily. 60 tablet 6 06/17/2022   Prenatal Vit-Fe Fumarate-FA (PRENATAL VITAMIN PO) Take by mouth.   06/17/2022   nitrofurantoin, macrocrystal-monohydrate, (MACROBID) 100 MG capsule Take 1 capsule (100 mg total) by mouth 2 (two) times daily. 10 capsule 0     I have reviewed patient's Past Medical Hx, Surgical Hx, Family Hx, Social Hx, medications and allergies.   ROS:  Review of Systems  Constitutional:  Negative for fever.  Gastrointestinal:  Positive for abdominal pain. Negative for constipation, diarrhea and nausea.  Genitourinary:  Negative for dysuria and frequency.   Other systems negative  Physical Exam  Patient Vitals for the past 24 hrs:  BP Temp Pulse Resp SpO2 Height Weight  06/18/22 0337 108/63 -- -- -- -- -- --  06/18/22 0335 -- 97.6 F (36.4 C) 77 18 99 % 5' 3"$  (1.6 m) 61.7 kg   Constitutional: Well-developed, well-nourished female in no acute distress.  Cardiovascular: normal rate and rhythm Respiratory: normal effort, clear to auscultation bilaterally GI: Abd soft, non-tender, gravid appropriate for gestational age.   No rebound or guarding. MS: Extremities nontender, no edema, normal ROM Neurologic: Alert and oriented x 4.  GU: Neg CVAT.    FHT:  150   Labs: Results for orders placed or performed during the  hospital encounter of 06/18/22 (from the past 24 hour(s))  Urinalysis, Routine w reflex microscopic -Urine, Clean Catch     Status: Abnormal   Collection Time: 06/18/22  3:55 AM  Result Value Ref Range   Color, Urine STRAW (A) YELLOW   APPearance HAZY (A) CLEAR   Specific Gravity, Urine 1.008 1.005 - 1.030   pH 9.0 (H) 5.0 -  8.0   Glucose, UA NEGATIVE NEGATIVE mg/dL   Hgb urine dipstick MODERATE (A) NEGATIVE   Bilirubin Urine NEGATIVE NEGATIVE   Ketones, ur 5 (A) NEGATIVE mg/dL   Protein, ur NEGATIVE NEGATIVE mg/dL   Nitrite NEGATIVE NEGATIVE   Leukocytes,Ua NEGATIVE NEGATIVE   RBC / HPF >50 0 - 5 RBC/hpf   WBC, UA 0-5 0 - 5 WBC/hpf   Bacteria, UA RARE (A) NONE SEEN   Squamous Epithelial / HPF 6-10 0 - 5 /HPF   Mucus PRESENT     O/Negative/-- (12/14 1525)  Imaging:  US RENAL  Result Date: 06/18/2022 CLINICAL DATA:  Left flank pain. Kidney Hirst complicating pregnancy EXAM: RENAL / URINARY TRACT ULTRASOUND COMPLETE COMPARISON:  09/21/2017 abdominal CT FINDINGS: Right Kidney: Renal measurements: 11 x 5 x 5 cm = volume: 150 mL. Echogenicity within normal limits. No mass or hydronephrosis visualized. Left Kidney: Renal measurements: 11.5 x 6.5 x 5 cm = volume: 13.6 mL. Mild hydronephrosis without visible obstructing process. There is scarring of the left kidney, especially at the lower pole, better depicted on prior abdominal CT. No convincing calculi. Bladder: Nondistended and essentially not assessed. IMPRESSION: 1. Mild left hydronephrosis. Nondistended bladder, cannot assess for jets. 2. Left renal scarring better depicted on abdominal CT from 2019. Electronically Signed   By: Jorje Guild M.D.   On: 06/18/2022 05:04     MAU Course/MDM: I have reviewed the triage vital signs and the nursing notes.   Pertinent labs & imaging results that were available during my care of the patient were reviewed by me and considered in my medical decision making (see chart for details).      I have  reviewed her medical records including past results, notes and treatments.   I have ordered labs and reviewed results. +hematuria US showed mild hydro on left but no Szatkowski visible Treatments in MAU included IV fluids, Dilaudid, Zofran and Flomax  Felt better afterward.    Assessment: SIngle IUP at. 56w0dLeft flank pain Suspect kidney Marro  Plan: Discharge home Recommend reconnect with Urology Follow up in Office for prenatal visits and recheck Encouraged to return if she develops worsening of symptoms, increase in pain, fever, or other concerning symptoms.   Pt stable at time of discharge.  MHansel FeinsteinCNM, MSN Certified Nurse-Midwife 06/18/2022 3:59 AM

## 2022-06-18 NOTE — MAU Note (Addendum)
.  Kelly Watts is a 27 y.o. at [redacted]w[redacted]d here in MAU reporting being awaken at 0100 with sharp, L flank pain. Hx stones and the pain feels like that from a [place]. All stones have been on the L. Has had one lithotripsy. Denies VB. States baby is breech and does not feel baby move a lot but baby's movement is her norm. Was treated for uti last wk  Onset of complaint: 0100 Pain score: 8 Vitals:   06/18/22 0335 06/18/22 0337  BP:  108/63  Pulse: 77   Resp: 18   Temp: 97.6 F (36.4 C)   SpO2: 99%      FHT:150 Lab orders placed from triage:  u/a

## 2022-06-18 NOTE — Progress Notes (Signed)
Written and verbal d/c instructions given and understanding voiced. 

## 2022-06-27 ENCOUNTER — Other Ambulatory Visit: Payer: Managed Care, Other (non HMO)

## 2022-07-01 ENCOUNTER — Encounter: Payer: Self-pay | Admitting: Women's Health

## 2022-07-01 ENCOUNTER — Ambulatory Visit (INDEPENDENT_AMBULATORY_CARE_PROVIDER_SITE_OTHER): Payer: Managed Care, Other (non HMO) | Admitting: Women's Health

## 2022-07-01 VITALS — BP 119/69 | HR 94 | Wt 138.0 lb

## 2022-07-01 DIAGNOSIS — Z3480 Encounter for supervision of other normal pregnancy, unspecified trimester: Secondary | ICD-10-CM

## 2022-07-01 DIAGNOSIS — Z8759 Personal history of other complications of pregnancy, childbirth and the puerperium: Secondary | ICD-10-CM

## 2022-07-01 DIAGNOSIS — Z3482 Encounter for supervision of other normal pregnancy, second trimester: Secondary | ICD-10-CM

## 2022-07-01 DIAGNOSIS — Z113 Encounter for screening for infections with a predominantly sexual mode of transmission: Secondary | ICD-10-CM

## 2022-07-01 DIAGNOSIS — Z3A22 22 weeks gestation of pregnancy: Secondary | ICD-10-CM

## 2022-07-01 NOTE — Patient Instructions (Signed)
Loma Sousa, thank you for choosing our office today! We appreciate the opportunity to meet your healthcare needs. You may receive a short survey by mail, e-mail, or through [employer]. If you are happy with your care we would appreciate if you could take just a few minutes to complete the survey questions. We read all of your comments and take your feedback very seriously. Thank you again for choosing our office.  Center for [employer] Team at [place] at [facility] ([place], [place] [address]) Entrance C, located off of [address] parking   You will have your sugar test next visit.  Please do not eat or drink anything after midnight the night before you come, not even water.  You will be here for at least two hours.  Please make an appointment online for the bloodwork at [email or web address] for 8:00am (or as close to this as possible). Make sure you select the [facility] service center.   CLASSES: Go to [email or web address] to register for classes (childbirth, breastfeeding, waterbirth, infant CPR, daddy bootcamp, etc.)  Call the office [phone]) or go to [facility] if: You begin to have strong, frequent contractions Your water breaks.  Sometimes it is a big gush of fluid, sometimes it is just a trickle that keeps getting your panties wet or running down your legs You have vaginal bleeding.  It is normal to have a small amount of spotting if your cervix was checked.  You don't feel your baby moving like normal.  If you don't, get you something to eat and drink and lay down and focus on feeling your baby move.   If your baby is still not moving like normal, you should call the office or go to [facility].  Call the office [phone]) or go to [facility] hospital for these signs of pre-eclampsia: Severe headache that does not go away with Tylenol Visual changes- seeing spots, double, blurred vision Pain under your right breast or  upper abdomen that does not go away with Tums or heartburn medicine Nausea and/or vomiting Severe swelling in your hands, feet, and face    [facility] Pediatricians/Family Doctors [facility]): [address] [place], [place]: [address] [place], [place] [facility]): [place], [phone] (call to ask if accepting patients) [facility] Department: [address] [place], [place] [facility]): [address], [address], [place] [facility]: [address] [place], [facility] [place]: [place], [employer] [facility]): [phone] [facility]: [address] [place]: [address] [place] [facility]: [phone], [phone]  Home Blood Pressure Monitoring for Patients   Your provider has recommended that you check your blood pressure (BP) at least once a week at home. If you do not have a blood pressure cuff at home, one will be provided for you. Contact your provider if you have not received your monitor within 1 week.   Helpful Tips for Accurate Home Blood Pressure Checks  Don't smoke, exercise, or drink  caffeine 30 minutes before checking your BP Use the restroom before checking your BP (a full bladder can raise your pressure) Relax in a comfortable upright chair Feet on the ground Left arm resting comfortably on a flat surface at the level of your heart Legs uncrossed Back supported Sit quietly and don't talk Place the cuff on your bare arm Adjust snuggly, so that only two fingertips can fit between your skin and the top of the cuff Check 2  readings separated by at least one minute Keep a log of your BP readings For a visual, please reference this diagram: http://ccnc.care/bpdiagram  Provider Name: [facility]     Phone: [phone]  Zone 1: ALL CLEAR  Continue to monitor your symptoms:  BP reading is less than 140 (top number) or less than 90 (bottom number)  No right upper stomach pain No headaches or seeing spots No feeling nauseated or throwing up No swelling in face and hands  Zone 2: CAUTION Call your doctor's office for any of the following:  BP reading is greater than 140 (top number) or greater than 90 (bottom number)  Stomach pain under your ribs in the middle or right side Headaches or seeing spots Feeling nauseated or throwing up Swelling in face and hands  Zone 3: EMERGENCY  Seek immediate medical care if you have any of the following:  BP reading is greater than160 (top number) or greater than 110 (bottom number) Severe headaches not improving with Tylenol Serious difficulty catching your breath Any worsening symptoms from Zone 2   Second Trimester of Pregnancy The second trimester is from week 13 through week 28, months 4 through 6. The second trimester is often a time when you feel your best. Your body has also adjusted to being pregnant, and you begin to feel better physically. Usually, morning sickness has lessened or quit completely, you may have more energy, and you may have an increase in appetite. The second trimester is also a time when the fetus is growing rapidly. At the end of the sixth month, the fetus is about 9 inches long and weighs about 1 pounds. You will likely begin to feel the baby move (quickening) between 18 and 20 weeks of the pregnancy. BODY CHANGES Your body goes through many changes during pregnancy. The changes vary from woman to woman.  Your weight will continue to increase. You will notice your lower abdomen bulging out. You may begin to get stretch marks on your  hips, abdomen, and breasts. You may develop headaches that can be relieved by medicines approved by your health care provider. You may urinate more often because the fetus is pressing on your bladder. You may develop or continue to have heartburn as a result of your pregnancy. You may develop constipation because certain hormones are causing the muscles that push waste through your intestines to slow down. You may develop hemorrhoids or swollen, bulging veins (varicose veins). You may have back pain because of the weight gain and pregnancy hormones relaxing your joints between the bones in your pelvis and as a result of a shift in weight and the muscles that support your balance. Your breasts will continue to grow and be tender. Your gums may bleed and may be sensitive to brushing and flossing. Dark spots or blotches (chloasma, mask of pregnancy) may develop on your face. This will likely fade after the baby is born. A dark line from your belly button to the pubic area (linea nigra) may appear. This  will likely fade after the baby is born. You may have changes in your hair. These can include thickening of your hair, rapid growth, and changes in texture. Some women also have hair loss during or after pregnancy, or hair that feels dry or thin. Your hair will most likely return to normal after your baby is born. WHAT TO EXPECT AT YOUR PRENATAL VISITS During a routine prenatal visit: You will be weighed to make sure you and the fetus are growing normally. Your blood pressure will be taken. Your abdomen will be measured to track your baby's growth. The fetal heartbeat will be listened to. Any test results from the previous visit will be discussed. Your health care provider may ask you: How you are feeling. If you are feeling the baby move. If you have had any abnormal symptoms, such as leaking fluid, bleeding, severe headaches, or abdominal cramping. If you have any questions. Other tests that may  be performed during your second trimester include: Blood tests that check for: Low iron levels (anemia). Gestational diabetes (between 24 and 28 weeks). Rh antibodies. Urine tests to check for infections, diabetes, or protein in the urine. An ultrasound to confirm the proper growth and development of the baby. An amniocentesis to check for possible genetic problems. Fetal screens for spina bifida and Down syndrome. HOME CARE INSTRUCTIONS  Avoid all smoking, herbs, alcohol, and unprescribed drugs. These chemicals affect the formation and growth of the baby. Follow your health care provider's instructions regarding medicine use. There are medicines that are either safe or unsafe to take during pregnancy. Exercise only as directed by your health care provider. Experiencing uterine cramps is a good sign to stop exercising. Continue to eat regular, healthy meals. Wear a good support bra for breast tenderness. Do not use hot tubs, steam rooms, or saunas. Wear your seat belt at all times when driving. Avoid raw meat, uncooked cheese, cat litter boxes, and soil used by cats. These carry germs that can cause birth defects in the baby. Take your prenatal vitamins. Try taking a stool softener (if your health care provider approves) if you develop constipation. Eat more high-fiber foods, such as fresh vegetables or fruit and whole grains. Drink plenty of fluids to keep your urine clear or pale yellow. Take warm sitz baths to soothe any pain or discomfort caused by hemorrhoids. Use hemorrhoid cream if your health care provider approves. If you develop varicose veins, wear support hose. Elevate your feet for 15 minutes, 3-4 times a day. Limit salt in your diet. Avoid heavy lifting, wear low heel shoes, and practice good posture. Rest with your legs elevated if you have leg cramps or low back pain. Visit your dentist if you have not gone yet during your pregnancy. Use a soft toothbrush to brush your teeth  and be gentle when you floss. A sexual relationship may be continued unless your health care provider directs you otherwise. Continue to go to all your prenatal visits as directed by your health care provider. SEEK MEDICAL CARE IF:  You have dizziness. You have mild pelvic cramps, pelvic pressure, or nagging pain in the abdominal area. You have persistent nausea, vomiting, or diarrhea. You have a bad smelling vaginal discharge. You have pain with urination. SEEK IMMEDIATE MEDICAL CARE IF:  You have a fever. You are leaking fluid from your vagina. You have spotting or bleeding from your vagina. You have severe abdominal cramping or pain. You have rapid weight gain or loss. You have shortness of  breath with chest pain. You notice sudden or extreme swelling of your face, hands, ankles, feet, or legs. You have not felt your baby move in over an hour. You have severe headaches that do not go away with medicine. You have vision changes. Document Released: 04/15/2001 Document Revised: 04/26/2013 Document Reviewed: 06/22/2012 [facility] Patient Information 2015 [place], [place]. This information is not intended to replace advice given to you by your health care provider. Make sure you discuss any questions you have with your health care provider.

## 2022-07-01 NOTE — Progress Notes (Signed)
LOW-RISK PREGNANCY VISIT Patient name: Kelly Watts MRN [id number]  Date of birth: 1995-07-09 Chief Complaint:   Routine Prenatal Visit  History of Present Illness:   Kelly Watts is a 27 y.o. [id number] female at 67w6dwith an Estimated Date of Delivery: 10/29/22 being seen today for ongoing management of a low-risk pregnancy.   Today she reports no complaints. Went to [facility] 2/14 w/ Lt flank pain, u/s neg for stones, did see Lt renal scarring. H/O kidney stones. Was recommended to f/u w/ urology. Reports pain gone, declines f/u w/ urology.  Contractions: Not present.  .  Movement: Present. denies leaking of fluid.     04/17/2022    2:39 PM 10/29/2021   11:00 AM 10/29/2021   10:55 AM 12/05/2019    9:16 AM 08/24/2019    3:37 PM  Depression screen PHQ 2/9  Decreased Interest 0 0 0 0 0  Down, Depressed, Hopeless 0 0 0 0 0  PHQ - 2 Score 0 0 0 0 0  Altered sleeping 3 0 0 0 0  Tired, decreased energy 0 0 0 0 1  Change in appetite 0 0 0 0 0  Feeling bad or failure about yourself  0 0 0 0 0  Trouble concentrating 0 0 0 0 0  Moving slowly or fidgety/restless 0 0 0 0 0  Suicidal thoughts 0 0 0 0 0  PHQ-9 Score 3 0 0 0 1  Difficult doing work/chores     Not difficult at all        04/17/2022    2:39 PM 10/29/2021   10:55 AM 12/05/2019    9:16 AM 08/24/2019    3:37 PM  GAD 7 : Generalized Anxiety Score  Nervous, Anxious, on Edge 0 0 0 0  Control/stop worrying 0 0 0 0  Worry too much - different things 0 0 0 0  Trouble relaxing 0 0 0 0  Restless 0 0 0 0  Easily annoyed or irritable 0 0 0 0  Afraid - awful might happen 0 0 0 0  Total GAD 7 Score 0 0 0 0  Anxiety Difficulty    Not difficult at all      Review of Systems:   Pertinent items are noted in HPI Denies abnormal vaginal discharge w/ itching/odor/irritation, headaches, visual changes, shortness of breath, chest pain, abdominal pain, severe nausea/vomiting, or problems with urination or bowel movements unless otherwise  stated above. Pertinent History Reviewed:  Reviewed past medical,surgical, social, obstetrical and family history.  Reviewed problem list, medications and allergies. Physical Assessment:   Vitals:   07/01/22 0949  BP: 119/69  Pulse: 94  Weight: 138 lb (62.6 kg)  Body mass index is 24.45 kg/m.        Physical Examination:   General appearance: Well appearing, and in no distress  Mental status: Alert, oriented to person, place, and time  Skin: Warm & dry  Cardiovascular: Normal heart rate noted  Respiratory: Normal respiratory effort, no distress  Abdomen: Soft, gravid, nontender  Pelvic: Cervical exam deferred         Extremities: Edema: None  Fetal Status: Fetal Heart Rate (bpm): 146 Fundal Height: 23 cm Movement: Present    Chaperone: N/A   No results found for this or any previous visit (from the past 24 hour(s)).  Assessment & Plan:  1) Low-risk pregnancy [id number] an Estimated Date of Delivery: 10/29/22   2) H/O 22wk PPROM/prolapsed cord/IUFD, reviewed ptl s/s,  reasons to seek care  3) Recent Lt flank pain> u/s 2/14 w/ Lt renal scarring, h/o kidney stones, no pain now. Declines urology referral  4) STD screen> gc/ct (none on chart yet)   Meds: No orders of the defined types were placed in this encounter.  Labs/procedures today: gc/ct (not done yet)  Plan:  Continue routine obstetrical care  Next visit: prefers will be in person for pn2     Reviewed: Preterm labor symptoms and general obstetric precautions including but not limited to vaginal bleeding, contractions, leaking of fluid and fetal movement were reviewed in detail with the patient.  All questions were answered. Does have home bp cuff. Office bp cuff given: not applicable. Check bp weekly, let us know if consistently >140 and/or >90.  Follow-up: Return in about 4 weeks (around 07/29/2022) for LROB, PN2, CNM, in person.  No future appointments.  Orders Placed This Encounter  Procedures    GC/Chlamydia Probe Amp   [place], [facility] 07/01/2022 10:11 AM

## 2022-07-04 LAB — GC/CHLAMYDIA PROBE AMP
Chlamydia trachomatis, NAA: NEGATIVE
Neisseria Gonorrhoeae by PCR: NEGATIVE

## 2022-07-29 ENCOUNTER — Other Ambulatory Visit: Payer: Managed Care, Other (non HMO)

## 2022-07-29 ENCOUNTER — Encounter: Payer: Self-pay | Admitting: Women's Health

## 2022-07-29 ENCOUNTER — Ambulatory Visit (INDEPENDENT_AMBULATORY_CARE_PROVIDER_SITE_OTHER): Payer: Managed Care, Other (non HMO) | Admitting: Women's Health

## 2022-07-29 VITALS — BP 117/70 | HR 86 | Wt 141.0 lb

## 2022-07-29 DIAGNOSIS — Z23 Encounter for immunization: Secondary | ICD-10-CM

## 2022-07-29 DIAGNOSIS — Z131 Encounter for screening for diabetes mellitus: Secondary | ICD-10-CM

## 2022-07-29 DIAGNOSIS — Z3480 Encounter for supervision of other normal pregnancy, unspecified trimester: Secondary | ICD-10-CM

## 2022-07-29 DIAGNOSIS — Z3A26 26 weeks gestation of pregnancy: Secondary | ICD-10-CM

## 2022-07-29 DIAGNOSIS — Z3482 Encounter for supervision of other normal pregnancy, second trimester: Secondary | ICD-10-CM

## 2022-07-29 NOTE — Patient Instructions (Addendum)
Kelly Watts, thank you for choosing our office today! We appreciate the opportunity to meet your healthcare needs. You may receive a short survey by mail, e-mail, or through [employer]. If you are happy with your care we would appreciate if you could take just a few minutes to complete the survey questions. We read all of your comments and take your feedback very seriously. Thank you again for choosing our office.  [facility] [employer] Team at [place] at [facility] ([place] [address]) Entrance C, located off of [address] parking   CLASSES: Go to [email or web address] to register for classes (childbirth, breastfeeding, waterbirth, infant CPR, daddy bootcamp, etc.)  Call the office [phone]) or go to [facility] if: You begin to have strong, frequent contractions Your water breaks.  Sometimes it is a big gush of fluid, sometimes it is just a trickle that keeps getting your panties wet or running down your legs You have vaginal bleeding.  It is normal to have a small amount of spotting if your cervix was checked.  You don't feel your baby moving like normal.  If you don't, get you something to eat and drink and lay down and focus on feeling your baby move.   If your baby is still not moving like normal, you should call the office or go to [facility].  Call the office [phone]) or go to [facility] hospital for these signs of pre-eclampsia: Severe headache that does not go away with Tylenol Visual changes- seeing spots, double, blurred vision Pain under your right breast or upper abdomen that does not go away with Tums or heartburn medicine Nausea and/or vomiting Severe swelling in your hands, feet, and face   Tdap Vaccine It is recommended that you get the Tdap vaccine during the third trimester of EACH pregnancy to help protect your baby from getting pertussis (whooping cough) 27-36 weeks is the BEST time to do  this so that you can pass the protection on to your baby. During pregnancy is better than after pregnancy, but if you are unable to get it during pregnancy it will be offered at the hospital.  You can get this vaccine with [place], at the health department, your family doctor, or some local pharmacies Everyone who will be around your baby should also be up-to-date on their vaccines before the baby comes. Adults (who are not pregnant) only need 1 dose of Tdap during adulthood.   [facility] Pediatricians/Family Doctors [facility]): [address] [place], [facility]: [address] [place], [phone]                [facility]): [place], [phone] (call to ask if accepting patients) [facility] Department: [address], [place], [place] Pediatricians/Family Doctors [facility]): [place]. [place], [address], [place] [facility]: [address] [place], [place] [facility]: [place], [place] [facility]): [phone] [facility]: [address], [place]: [address] [place] [facility]: [phone], [phone]  Home Blood Pressure Monitoring for Patients   Your provider has recommended that you check your  blood pressure (BP) at least once a week at home. If you do not have a blood pressure cuff at home, one will be provided for you. Contact your provider if you have not received your monitor within 1 week.   Helpful Tips for Accurate Home Blood Pressure Checks  Don't smoke, exercise, or drink caffeine 30 minutes before checking your BP Use the restroom before checking your BP (a full bladder can raise your  pressure) Relax in a comfortable upright chair Feet on the ground Left arm resting comfortably on a flat surface at the level of your heart Legs uncrossed Back supported Sit quietly and don't talk Place the cuff on your bare arm Adjust snuggly, so that only two fingertips can fit between your skin and the top of the cuff Check 2 readings separated by at least one minute Keep a log of your BP readings For a visual, please reference this diagram: http://ccnc.care/bpdiagram  Provider Name: [facility]     Phone: [phone]  Zone 1: ALL CLEAR  Continue to monitor your symptoms:  BP reading is less than 140 (top number) or less than 90 (bottom number)  No right upper stomach pain No headaches or seeing spots No feeling nauseated or throwing up No swelling in face and hands  Zone 2: CAUTION Call your doctor's office for any of the following:  BP reading is greater than 140 (top number) or greater than 90 (bottom number)  Stomach pain under your ribs in the middle or right side Headaches or seeing spots Feeling nauseated or throwing up Swelling in face and hands  Zone 3: EMERGENCY  Seek immediate medical care if you have any of the following:  BP reading is greater than160 (top number) or greater than 110 (bottom number) Severe headaches not improving with Tylenol Serious difficulty catching your breath Any worsening symptoms from Zone 2   Third Trimester of Pregnancy The third trimester is from week 29 through week 42, months 7 through 9. The third trimester is a time when the fetus is growing rapidly. At the end of the ninth month, the fetus is about 20 inches in length and weighs 6-10 pounds.  BODY CHANGES Your body goes through many changes during pregnancy. The changes vary from woman to woman.  Your weight will continue to increase. You can expect to gain 25-35 pounds (11-16 kg) by the end of the pregnancy. You may begin to get stretch marks on your hips, abdomen,  and breasts. You may urinate more often because the fetus is moving lower into your pelvis and pressing on your bladder. You may develop or continue to have heartburn as a result of your pregnancy. You may develop constipation because certain hormones are causing the muscles that push waste through your intestines to slow down. You may develop hemorrhoids or swollen, bulging veins (varicose veins). You may have pelvic pain because of the weight gain and pregnancy hormones relaxing your joints between the bones in your pelvis. Backaches may result from overexertion of the muscles supporting your posture. You may have changes in your hair. These can include thickening of your hair, rapid growth, and changes in texture. Some women also have hair loss during or after pregnancy, or hair that feels dry or thin. Your hair will most likely return to normal after your baby is born. Your breasts will continue to grow and be tender. A yellow discharge may leak from your breasts called colostrum. Your belly button may stick out. You may   feel short of breath because of your expanding uterus. You may notice the fetus "dropping," or moving lower in your abdomen. You may have a bloody mucus discharge. This usually occurs a few days to a week before labor begins. Your cervix becomes thin and soft (effaced) near your due date. WHAT TO EXPECT AT YOUR PRENATAL EXAMS  You will have prenatal exams every 2 weeks until week 36. Then, you will have weekly prenatal exams. During a routine prenatal visit: You will be weighed to make sure you and the fetus are growing normally. Your blood pressure is taken. Your abdomen will be measured to track your baby's growth. The fetal heartbeat will be listened to. Any test results from the previous visit will be discussed. You may have a cervical check near your due date to see if you have effaced. At around 36 weeks, your caregiver will check your cervix. At the same time, your  caregiver will also perform a test on the secretions of the vaginal tissue. This test is to determine if a type of bacteria, Group B streptococcus, is present. Your caregiver will explain this further. Your caregiver may ask you: What your birth plan is. How you are feeling. If you are feeling the baby move. If you have had any abnormal symptoms, such as leaking fluid, bleeding, severe headaches, or abdominal cramping. If you have any questions. Other tests or screenings that may be performed during your third trimester include: Blood tests that check for low iron levels (anemia). Fetal testing to check the health, activity level, and growth of the fetus. Testing is done if you have certain medical conditions or if there are problems during the pregnancy. FALSE LABOR You may feel small, irregular contractions that eventually go away. These are called Braxton Hicks contractions, or false labor. Contractions may last for hours, days, or even weeks before true labor sets in. If contractions come at regular intervals, intensify, or become painful, it is best to be seen by your caregiver.  SIGNS OF LABOR  Menstrual-like cramps. Contractions that are 5 minutes apart or less. Contractions that start on the top of the uterus and spread down to the lower abdomen and back. A sense of increased pelvic pressure or back pain. A watery or bloody mucus discharge that comes from the vagina. If you have any of these signs before the 37th week of pregnancy, call your caregiver right away. You need to go to the hospital to get checked immediately. HOME CARE INSTRUCTIONS  Avoid all smoking, herbs, alcohol, and unprescribed drugs. These chemicals affect the formation and growth of the baby. Follow your caregiver's instructions regarding medicine use. There are medicines that are either safe or unsafe to take during pregnancy. Exercise only as directed by your caregiver. Experiencing uterine cramps is a good sign to  stop exercising. Continue to eat regular, healthy meals. Wear a good support bra for breast tenderness. Do not use hot tubs, steam rooms, or saunas. Wear your seat belt at all times when driving. Avoid raw meat, uncooked cheese, cat litter boxes, and soil used by cats. These carry germs that can cause birth defects in the baby. Take your prenatal vitamins. Try taking a stool softener (if your caregiver approves) if you develop constipation. Eat more high-fiber foods, such as fresh vegetables or fruit and whole grains. Drink plenty of fluids to keep your urine clear or pale yellow. Take warm sitz baths to soothe any pain or discomfort caused by hemorrhoids. Use hemorrhoid cream if   your caregiver approves. If you develop varicose veins, wear support hose. Elevate your feet for 15 minutes, 3-4 times a day. Limit salt in your diet. Avoid heavy lifting, wear low heal shoes, and practice good posture. Rest a lot with your legs elevated if you have leg cramps or low back pain. Visit your dentist if you have not gone during your pregnancy. Use a soft toothbrush to brush your teeth and be gentle when you floss. A sexual relationship may be continued unless your caregiver directs you otherwise. Do not travel far distances unless it is absolutely necessary and only with the approval of your caregiver. Take prenatal classes to understand, practice, and ask questions about the labor and delivery. Make a trial run to the hospital. Pack your hospital bag. Prepare the baby's nursery. Continue to go to all your prenatal visits as directed by your caregiver. SEEK MEDICAL CARE IF: You are unsure if you are in labor or if your water has broken. You have dizziness. You have mild pelvic cramps, pelvic pressure, or nagging pain in your abdominal area. You have persistent nausea, vomiting, or diarrhea. You have a bad smelling vaginal discharge. You have pain with urination. SEEK IMMEDIATE MEDICAL CARE IF:  You  have a fever. You are leaking fluid from your vagina. You have spotting or bleeding from your vagina. You have severe abdominal cramping or pain. You have rapid weight loss or gain. You have shortness of breath with chest pain. You notice sudden or extreme swelling of your face, hands, ankles, feet, or legs. You have not felt your baby move in over an hour. You have severe headaches that do not go away with medicine. You have vision changes. Document Released: 04/15/2001 Document Revised: 04/26/2013 Document Reviewed: 06/22/2012 [facility] Patient Information 2015 [place], [place]. This information is not intended to replace advice given to you by your health care provider. Make sure you discuss any questions you have with your health care provider.  Sciatica Rehab Ask your health care provider which exercises are safe for you. Do exercises exactly as told by your health care provider and adjust them as directed. It is normal to feel mild stretching, pulling, tightness, or discomfort as you do these exercises. Stop right away if you feel sudden pain or your pain gets worse. Do not begin these exercises until told by your health care provider. Stretching and range-of-motion exercises These exercises warm up your muscles and joints and improve the movement and flexibility of your hips and back. These exercises also help to relieve pain, numbness, and tingling. Sciatic nerve glide  Sit in a chair with your head facing down toward your chest. Place your hands behind your back. Let your shoulders slump forward. Slowly straighten one of your legs while you tilt your head back as if you are looking toward the ceiling. Only straighten your leg as far as you can without making your symptoms worse. Hold this position for __________ seconds. Slowly return your leg and head back to the starting position. Repeat with your other leg. Repeat __________ times. Complete this exercise __________ times a  day. Knee to chest with hip adduction and internal rotation  Lie on your back on a firm surface with both legs straight. Bend one of your knees and move it up toward your chest until you feel a gentle stretch in your lower back and buttock. Then, move your knee toward the shoulder that is on the opposite side from your leg. This is hip adduction and  internal rotation. Hold your leg in this position by holding on to the front of your knee. Hold this position for __________ seconds. Slowly return to the starting position. Repeat with your other leg. Repeat __________ times. Complete this exercise __________ times a day. Prone extension on elbows  Lie on your abdomen on a firm surface. A bed may be too soft for this exercise. Prop yourself up on your elbows. Use your arms to help lift your chest up until you feel a gentle stretch in your abdomen and your lower back. This will place some of your body weight on your elbows. If this is uncomfortable, try stacking pillows under your chest. Your hips should stay down, against the surface that you are lying on. Keep your hip and back muscles relaxed. Hold this position for __________ seconds. Slowly relax your upper body and return to the starting position. Repeat __________ times. Complete this exercise __________ times a day. Strengthening exercises These exercises build strength and endurance in your back. Endurance is the ability to use your muscles for a long time, even after they get tired. Pelvic tilt This exercise strengthens the muscles that lie deep in the abdomen. Lie on your back on a firm surface. Bend your knees and keep your feet flat on the surface. Tense your abdominal muscles. Tip your pelvis up toward the ceiling and flatten your lower back into the firm surface. To help with this exercise, you may place a small towel under your lower back and try to push your back into the towel. Hold this position for __________ seconds. Let  your muscles relax completely before you repeat this exercise. Repeat __________ times. Complete this exercise __________ times a day. Alternating arm and leg raises  Get on your hands and knees on a firm surface. If you are on a hard floor, you may want to use padding, such as an exercise mat, to cushion your knees. Line up your arms and legs. Your hands should be directly below your shoulders, and your knees should be directly below your hips. Lift your left leg behind you. At the same time, raise your right arm and straighten it in front of you. Do not lift your leg higher than your hip. Do not lift your arm higher than your shoulder. Keep your abdominal and back muscles tight. Keep your hips facing the ground. Do not arch your back. Keep your balance carefully, and do not hold your breath. Hold this position for __________ seconds. Slowly return to the starting position. Repeat with your right leg and your left arm. Repeat __________ times. Complete this exercise __________ times a day. Posture and body mechanics Good posture and healthy body mechanics can help to relieve stress in your body's tissues and joints. Body mechanics refers to the movements and positions of your body while you do your daily activities. Posture is part of body mechanics. Good posture means: Your spine is in its natural S-curve position (neutral). Your shoulders are pulled back slightly. Your head is not tipped forward. Follow these guidelines to improve your posture and body mechanics in your everyday activities. Standing  When standing, keep your spine neutral and your feet about hip width apart. Keep a slight bend in your knees. Your ears, shoulders, and hips should line up. When you do a task in which you stand in one place for a long time, place one foot up on a stable object that is 2-4 inches (5-10 cm) high, such as a footstool. This  helps keep your spine neutral. Sitting  When sitting, keep your  spine neutral and keep your feet flat on the floor. Use a footrest, if necessary, and keep your thighs parallel to the floor. Avoid rounding your shoulders, and avoid tilting your head forward. When working at a desk or a computer, keep your desk at a height where your hands are slightly lower than your elbows. Slide your chair under your desk so you are close enough to maintain good posture. When working at a computer, place your monitor at a height where you are looking straight ahead and you do not have to tilt your head forward or downward to look at the screen. Resting  When lying down and resting, avoid positions that are most painful for you. If you have pain with activities such as sitting, bending, stooping, or squatting, lie in a position in which your body does not bend very much. For example, avoid curling up on your side with your arms and knees near your chest (fetal position). If you have pain with activities such as standing for a long time or reaching with your arms, lie with your spine in a neutral position and bend your knees slightly. Try the following positions: Lying on your side with a pillow between your knees. Lying on your back with a pillow under your knees. Lifting  When lifting objects, keep your feet at least shoulder width apart and tighten your abdominal muscles. Bend your knees and hips and keep your spine neutral. It is important to lift using the strength of your legs, not your back. Do not lock your knees straight out. Always ask for help to lift heavy or awkward objects. This information is not intended to replace advice given to you by your health care provider. Make sure you discuss any questions you have with your health care provider. Document Revised: 07/30/2021 Document Reviewed: 07/30/2021 [employer] Patient Education  [place].

## 2022-07-29 NOTE — Progress Notes (Signed)
LOW-RISK PREGNANCY VISIT Patient name: Kelly Watts MRN [id number]  Date of birth: [birth year] Chief Complaint:   Routine Prenatal Visit (PN2)  History of Present Illness:   Kelly Watts is a 27 y.o. [id number] female at [redacted]w[redacted]d with an Estimated Date of Delivery: 10/29/22 being seen today for ongoing management of a low-risk pregnancy.   Today she reports  Lt lower back/hip pain radiates to leg, toes went numb the other day . Contractions: Not present.  .  Movement: Present. denies leaking of fluid.     04/17/2022    2:39 PM 10/29/2021   11:00 AM 10/29/2021   10:55 AM 12/05/2019    9:16 AM 08/24/2019    3:37 PM  Depression screen PHQ 2/9  Decreased Interest 0 0 0 0 0  Down, Depressed, Hopeless 0 0 0 0 0  PHQ - 2 Score 0 0 0 0 0  Altered sleeping 3 0 0 0 0  Tired, decreased energy 0 0 0 0 1  Change in appetite 0 0 0 0 0  Feeling bad or failure about yourself  0 0 0 0 0  Trouble concentrating 0 0 0 0 0  Moving slowly or fidgety/restless 0 0 0 0 0  Suicidal thoughts 0 0 0 0 0  PHQ-9 Score 3 0 0 0 1  Difficult doing work/chores     Not difficult at all        04/17/2022    2:39 PM 10/29/2021   10:55 AM 12/05/2019    9:16 AM 08/24/2019    3:37 PM  GAD 7 : Generalized Anxiety Score  Nervous, Anxious, on Edge 0 0 0 0  Control/stop worrying 0 0 0 0  Worry too much - different things 0 0 0 0  Trouble relaxing 0 0 0 0  Restless 0 0 0 0  Easily annoyed or irritable 0 0 0 0  Afraid - awful might happen 0 0 0 0  Total GAD 7 Score 0 0 0 0  Anxiety Difficulty    Not difficult at all      Review of Systems:   Pertinent items are noted in HPI Denies abnormal vaginal discharge w/ itching/odor/irritation, headaches, visual changes, shortness of breath, chest pain, abdominal pain, severe nausea/vomiting, or problems with urination or bowel movements unless otherwise stated above. Pertinent History Reviewed:  Reviewed past medical,surgical, social, obstetrical and family history.   Reviewed problem list, medications and allergies. Physical Assessment:   Vitals:   07/29/22 0911  BP: 117/70  Pulse: 86  Weight: 141 lb (64 kg)  Body mass index is 24.98 kg/m.        Physical Examination:   General appearance: Well appearing, and in no distress  Mental status: Alert, oriented to person, place, and time  Skin: Warm & dry  Cardiovascular: Normal heart rate noted  Respiratory: Normal respiratory effort, no distress  Abdomen: Soft, gravid, nontender  Pelvic: Cervical exam deferred         Extremities: Edema: None  Fetal Status: Fetal Heart Rate (bpm): 153 Fundal Height: 27 cm Movement: Present    Chaperone: N/A   No results found for this or any previous visit (from the past 24 hour(s)).  Assessment & Plan:  1) Low-risk pregnancy [id number] at [redacted]w[redacted]d with an Estimated Date of Delivery: 10/29/22   2) Lt sciatica, gave printed exercises  3) H/O 22wk PPROM/prolapsed cord/IUFD> declined prometrium   Meds: No orders of the defined types were placed in this encounter.  Labs/procedures today: tdap and PN2  Plan:  Continue routine obstetrical care  Next visit: prefers in person for Rhogam  Reviewed: Preterm labor symptoms and general obstetric precautions including but not limited to vaginal bleeding, contractions, leaking of fluid and fetal movement were reviewed in detail with the patient.  All questions were answered. Does have home bp cuff. Office bp cuff given: not applicable. Check bp weekly, let us know if consistently >140 and/or >90.  Follow-up: Return in about 3 weeks (around 08/19/2022) for [place], [place], in person.  No future appointments.  No orders of the defined types were placed in this encounter.  [place], [facility] 07/29/2022 9:37 AM

## 2022-07-30 LAB — CBC
Hematocrit: 33.5 % — ABNORMAL LOW (ref 34.0–46.6)
Hemoglobin: 11.2 g/dL (ref 11.1–15.9)
MCH: 29.7 pg (ref 26.6–33.0)
MCHC: 33.4 g/dL (ref 31.5–35.7)
MCV: 89 fL (ref 79–97)
Platelets: 291 10*3/uL (ref 150–450)
RBC: 3.77 x10E6/uL (ref 3.77–5.28)
RDW: 12.5 % (ref 11.7–15.4)
WBC: 9.3 10*3/uL (ref 3.4–10.8)

## 2022-07-30 LAB — GLUCOSE TOLERANCE, 2 HOURS W/ 1HR
Glucose, 1 hour: 77 mg/dL (ref 70–179)
Glucose, 2 hour: 80 mg/dL (ref 70–152)
Glucose, Fasting: 80 mg/dL (ref 70–91)

## 2022-07-30 LAB — HIV ANTIBODY (ROUTINE TESTING W REFLEX): HIV Screen 4th Generation wRfx: NONREACTIVE

## 2022-07-30 LAB — ANTIBODY SCREEN: Antibody Screen: NEGATIVE

## 2022-07-30 LAB — RPR: RPR Ser Ql: NONREACTIVE

## 2022-08-19 ENCOUNTER — Ambulatory Visit: Payer: Managed Care, Other (non HMO) | Admitting: Women's Health

## 2022-08-19 ENCOUNTER — Encounter: Payer: Self-pay | Admitting: Women's Health

## 2022-08-19 VITALS — BP 129/72 | HR 89 | Wt 140.0 lb

## 2022-08-19 DIAGNOSIS — Z3A29 29 weeks gestation of pregnancy: Secondary | ICD-10-CM | POA: Diagnosis not present

## 2022-08-19 DIAGNOSIS — Z3483 Encounter for supervision of other normal pregnancy, third trimester: Secondary | ICD-10-CM

## 2022-08-19 NOTE — Progress Notes (Signed)
LOW-RISK PREGNANCY VISIT Patient name: Kelly Watts MRN [id number]  Date of birth: [birth year] Chief Complaint:   Routine Prenatal Visit  History of Present Illness:   Kelly Watts is a 27 y.o. [id number] female at [redacted]w[redacted]d with an Estimated Date of Delivery: 10/29/22 being seen today for ongoing management of a low-risk pregnancy.   Today she reports no complaints. Contractions: Not present. Vag. Bleeding: None.  Movement: Present. denies leaking of fluid.     04/17/2022    2:39 PM 10/29/2021   11:00 AM 10/29/2021   10:55 AM 12/05/2019    9:16 AM 08/24/2019    3:37 PM  Depression screen PHQ 2/9  Decreased Interest 0 0 0 0 0  Down, Depressed, Hopeless 0 0 0 0 0  PHQ - 2 Score 0 0 0 0 0  Altered sleeping 3 0 0 0 0  Tired, decreased energy 0 0 0 0 1  Change in appetite 0 0 0 0 0  Feeling bad or failure about yourself  0 0 0 0 0  Trouble concentrating 0 0 0 0 0  Moving slowly or fidgety/restless 0 0 0 0 0  Suicidal thoughts 0 0 0 0 0  PHQ-9 Score 3 0 0 0 1  Difficult doing work/chores     Not difficult at all        04/17/2022    2:39 PM 10/29/2021   10:55 AM 12/05/2019    9:16 AM 08/24/2019    3:37 PM  GAD 7 : Generalized Anxiety Score  Nervous, Anxious, on Edge 0 0 0 0  Control/stop worrying 0 0 0 0  Worry too much - different things 0 0 0 0  Trouble relaxing 0 0 0 0  Restless 0 0 0 0  Easily annoyed or irritable 0 0 0 0  Afraid - awful might happen 0 0 0 0  Total GAD 7 Score 0 0 0 0  Anxiety Difficulty    Not difficult at all      Review of Systems:   Pertinent items are noted in HPI Denies abnormal vaginal discharge w/ itching/odor/irritation, headaches, visual changes, shortness of breath, chest pain, abdominal pain, severe nausea/vomiting, or problems with urination or bowel movements unless otherwise stated above. Pertinent History Reviewed:  Reviewed past medical,surgical, social, obstetrical and family history.  Reviewed problem list, medications and  allergies. Physical Assessment:   Vitals:   08/19/22 1341  BP: 129/72  Pulse: 89  Weight: 140 lb (63.5 kg)  Body mass index is 24.8 kg/m.        Physical Examination:   General appearance: Well appearing, and in no distress  Mental status: Alert, oriented to person, place, and time  Skin: Warm & dry  Cardiovascular: Normal heart rate noted  Respiratory: Normal respiratory effort, no distress  Abdomen: Soft, gravid, nontender  Pelvic: Cervical exam deferred         Extremities:    Fetal Status: Fetal Heart Rate (bpm): 142 Fundal Height: 29 cm Movement: Present    Chaperone: N/A   No results found for this or any previous visit (from the past 24 hour(s)).  Assessment & Plan:  1) Low-risk pregnancy [id number] at [redacted]w[redacted]d with an Estimated Date of Delivery: 10/29/22   2) H/O 22wk PPROM/prolapsed cord/IUFD   Meds: No orders of the defined types were placed in this encounter.  Labs/procedures today: Rhogam  Plan:  Continue routine obstetrical care  Next visit: prefers in person    Reviewed: Preterm  labor symptoms and general obstetric precautions including but not limited to vaginal bleeding, contractions, leaking of fluid and fetal movement were reviewed in detail with the patient.  All questions were answered. Does have home bp cuff. Office bp cuff given: no. Check bp weekly, let us know if consistently >140 and/or >90.  Follow-up: Return in about 2 weeks (around 09/02/2022) for LROB, CNM, in person.  Future Appointments  Date Time Provider Department Center  09/02/2022  3:10 PM Cheral Marker, CNM [facility]    Orders Placed This Encounter  Procedures   RHO (D) Immune Globulin   Cheral Marker CNM, [facility] 08/19/2022 2:04 PM

## 2022-08-19 NOTE — Patient Instructions (Signed)
Kelly Watts, thank you for choosing our office today! We appreciate the opportunity to meet your healthcare needs. You may receive a short survey by mail, e-mail, or through MyChart. If you are happy with your care we would appreciate if you could take just a few minutes to complete the survey questions. We read all of your comments and take your feedback very seriously. Thank you again for choosing our office.  [facility]  [facility] [place] ([address] [place], [place] [address]) Entrance C, located off of [address] Free 24/7 valet parking   CLASSES: Go to [email or web address] to register for classes (childbirth, breastfeeding, waterbirth, infant CPR, daddy bootcamp, etc.)  Call the office ([phone]) or go to [facility] if: You begin to have strong, frequent contractions Your water breaks.  Sometimes it is a big gush of fluid, sometimes it is just a trickle that keeps getting your panties wet or running down your legs You have vaginal bleeding.  It is normal to have a small amount of spotting if your cervix was checked.  You don't feel your baby moving like normal.  If you don't, get you something to eat and drink and lay down and focus on feeling your baby move.   If your baby is still not moving like normal, you should call the office or go to [facility].  Call the office ([phone]) or go to [facility] for these signs of pre-eclampsia: Severe headache that does not go away with Tylenol Visual changes- seeing spots, double, blurred vision Pain under your right breast or upper abdomen that does not go away with Tums or heartburn medicine Nausea and/or vomiting Severe swelling in your hands, feet, and face   Tdap Vaccine It is recommended that you get the Tdap vaccine during the third trimester of EACH pregnancy to help protect your baby from getting pertussis (whooping cough) 27-36 weeks is the BEST time to do  this so that you can pass the protection on to your baby. During pregnancy is better than after pregnancy, but if you are unable to get it during pregnancy it will be offered at the hospital.  You can get this vaccine with us, at the health department, your family doctor, or some local pharmacies Everyone who will be around your baby should also be up-to-date on their vaccines before the baby comes. Adults (who are not pregnant) only need 1 dose of Tdap during adulthood.   [place] Pediatricians/Family Doctors [facility] ([facility]): [address], [phone]           [facility]: [address], [phone]                [facility] ([facility]): [address], [phone] (call to ask if accepting patients) [facility]: [address], [place], [phone]    [place] Pediatricians/Family Doctors [facility] ([facility]): [address], [phone] [facility]: [address], [phone] [facility] [place]: [address], [phone]  [place] [facility]  [facility] ([facility]): [phone] [facility]: [address], [phone]   [place] Family Doctors [facility]: [address], [phone]  [place] [facility]  [facility]: [address], [phone]  Home Blood Pressure Monitoring for Patients   Your provider has recommended that you check your   blood pressure (BP) at least once a week at home. If you do not have a blood pressure cuff at home, one will be provided for you. Contact your provider if you have not received your monitor within 1 week.   Helpful Tips for Accurate Home Blood Pressure Checks  Don't smoke, exercise, or drink caffeine 30 minutes before checking your BP Use the restroom before checking your BP (a full bladder can raise your  pressure) Relax in a comfortable upright chair Feet on the ground Left arm resting comfortably on a flat surface at the level of your heart Legs uncrossed Back supported Sit quietly and don't talk Place the cuff on your bare arm Adjust snuggly, so that only two fingertips can fit between your skin and the top of the cuff Check 2 readings separated by at least one minute Keep a log of your BP readings For a visual, please reference this diagram: http://ccnc.care/bpdiagram  Provider Name: [facility]     Phone: [phone]  Zone 1: ALL CLEAR  Continue to monitor your symptoms:  BP reading is less than 140 (top number) or less than 90 (bottom number)  No right upper stomach pain No headaches or seeing spots No feeling nauseated or throwing up No swelling in face and hands  Zone 2: CAUTION Call your doctor's office for any of the following:  BP reading is greater than 140 (top number) or greater than 90 (bottom number)  Stomach pain under your ribs in the middle or right side Headaches or seeing spots Feeling nauseated or throwing up Swelling in face and hands  Zone 3: EMERGENCY  Seek immediate medical care if you have any of the following:  BP reading is greater than160 (top number) or greater than 110 (bottom number) Severe headaches not improving with Tylenol Serious difficulty catching your breath Any worsening symptoms from Zone 2  Preterm Labor and Birth Information  The normal length of a pregnancy is 39-41 weeks. Preterm labor is when labor starts before 37 completed weeks of pregnancy. What are the risk factors for preterm labor? Preterm labor is more likely to occur in women who: Have certain infections during pregnancy such as a bladder infection, sexually transmitted infection, or infection inside the uterus (chorioamnionitis). Have a shorter-than-normal cervix. Have gone into preterm labor before. Have had surgery on their cervix. Are younger than age 17  or older than age 35. Are African American. Are pregnant with twins or multiple babies (multiple gestation). Take street drugs or smoke while pregnant. Do not gain enough weight while pregnant. Became pregnant shortly after having been pregnant. What are the symptoms of preterm labor? Symptoms of preterm labor include: Cramps similar to those that can happen during a menstrual period. The cramps may happen with diarrhea. Pain in the abdomen or lower back. Regular uterine contractions that may feel like tightening of the abdomen. A feeling of increased pressure in the pelvis. Increased watery or bloody mucus discharge from the vagina. Water breaking (ruptured amniotic sac). Why is it important to recognize signs of preterm labor? It is important to recognize signs of preterm labor because babies who are born prematurely may not be fully developed. This can put them at an increased risk for: Long-term (chronic) heart and lung problems. Difficulty immediately after birth with regulating body systems, including blood sugar, body temperature, heart rate, and breathing rate. Bleeding in the brain. Cerebral palsy. Learning difficulties. Death. These risks are highest for babies who are born before 34 weeks   of pregnancy. How is preterm labor treated? Treatment depends on the length of your pregnancy, your condition, and the health of your baby. It may involve: Having a stitch (suture) placed in your cervix to prevent your cervix from opening too early (cerclage). Taking or being given medicines, such as: Hormone medicines. These may be given early in pregnancy to help support the pregnancy. Medicine to stop contractions. Medicines to help mature the baby's lungs. These may be prescribed if the risk of delivery is high. Medicines to prevent your baby from developing cerebral palsy. If the labor happens before 34 weeks of pregnancy, you may need to stay in the hospital. What should I do if I  think I am in preterm labor? If you think that you are going into preterm labor, call your health care provider right away. How can I prevent preterm labor in future pregnancies? To increase your chance of having a full-term pregnancy: Do not use any tobacco products, such as cigarettes, chewing tobacco, and e-cigarettes. If you need help quitting, ask your health care provider. Do not use street drugs or medicines that have not been prescribed to you during your pregnancy. Talk with your health care provider before taking any herbal supplements, even if you have been taking them regularly. Make sure you gain a healthy amount of weight during your pregnancy. Watch for infection. If you think that you might have an infection, get it checked right away. Make sure to tell your health care provider if you have gone into preterm labor before. This information is not intended to replace advice given to you by your health care provider. Make sure you discuss any questions you have with your health care provider. Document Revised: 08/13/2018 Document Reviewed: 09/12/2015 Elsevier Patient Education  2020 Elsevier Inc[employer]   

## 2022-09-02 ENCOUNTER — Encounter: Payer: Self-pay | Admitting: Women's Health

## 2022-09-02 ENCOUNTER — Ambulatory Visit (INDEPENDENT_AMBULATORY_CARE_PROVIDER_SITE_OTHER): Payer: Managed Care, Other (non HMO) | Admitting: Women's Health

## 2022-09-02 VITALS — BP 116/65 | HR 104 | Wt 147.0 lb

## 2022-09-02 DIAGNOSIS — Z3480 Encounter for supervision of other normal pregnancy, unspecified trimester: Secondary | ICD-10-CM

## 2022-09-02 DIAGNOSIS — Z3483 Encounter for supervision of other normal pregnancy, third trimester: Secondary | ICD-10-CM

## 2022-09-02 DIAGNOSIS — Z3A31 31 weeks gestation of pregnancy: Secondary | ICD-10-CM
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
## 2022-09-02 NOTE — Patient Instructions (Signed)
Kelly Watts, thank you for choosing our office today! We appreciate the opportunity to meet your healthcare needs. You may receive a short survey by mail, e-mail, or through MyChart. If you are happy with your care we would appreciate if you could take just a few minutes to complete the survey questions. We read all of your comments and take your feedback very seriously. Thank you again for choosing our office.  [facility] ([address] [place], [place] [address]) Entrance C, located off of [address] Free 24/7 valet parking   CLASSES: Go to [email or web address] to register for classes (childbirth, breastfeeding, waterbirth, infant CPR, daddy bootcamp, etc.)  Call the office ([phone]) or go to [facility] if: You begin to have strong, frequent contractions Your water breaks.  Sometimes it is a big gush of fluid, sometimes it is just a trickle that keeps getting your panties wet or running down your legs You have vaginal bleeding.  It is normal to have a small amount of spotting if your cervix was checked.  You don't feel your baby moving like normal.  If you don't, get you something to eat and drink and lay down and focus on feeling your baby move.   If your baby is still not moving like normal, you should call the office or go to [facility].  Call the office ([phone]) or go to [facility] for these signs of pre-eclampsia: Severe headache that does not go away with Tylenol Visual changes- seeing spots, double, blurred vision Pain under your right breast or upper abdomen that does not go away with Tums or heartburn medicine Nausea and/or vomiting Severe swelling in your hands, feet, and face   Tdap Vaccine It is recommended that you get the Tdap vaccine during the third trimester of EACH pregnancy to help protect your baby from getting pertussis (whooping cough) 27-36 weeks is the BEST time to do  this so that you can pass the protection on to your baby. During pregnancy is better than after pregnancy, but if you are unable to get it during pregnancy it will be offered at the hospital.  You can get this vaccine with us, at the health department, your family doctor, or some local pharmacies Everyone who will be around your baby should also be up-to-date on their vaccines before the baby comes. Adults (who are not pregnant) only need 1 dose of Tdap during adulthood.   [place] Pediatricians/Family Doctors [facility] ([facility]): [address], [phone]           [facility]: [address], [phone]                [facility] ([facility]): [address], [phone] (call to ask if accepting patients) [facility]: [address], [place], [phone]    [place] Pediatricians/Family Doctors [facility] ([facility]): [address], [phone] [facility]: [address], [phone] [facility]: [address], [phone]  [place] [facility]  [facility] ([facility]): [phone] [facility]: [address], [phone]   [place] Family Doctors [facility]: [address], [phone]  [place] [facility]  [facility]: [address], [phone]  Home Blood Pressure Monitoring for Patients   Your provider has recommended that you check your   blood pressure (BP) at least once a week at home. If you do not have a blood pressure cuff at home, one will be provided for you. Contact your provider if you have not received your monitor within 1 week.   Helpful Tips for Accurate Home Blood Pressure Checks  Don't smoke, exercise, or drink caffeine 30 minutes before checking your BP Use the restroom before checking your BP (a full bladder can raise your  pressure) Relax in a comfortable upright chair Feet on the ground Left arm resting comfortably on a flat surface at the level of your heart Legs uncrossed Back supported Sit quietly and don't talk Place the cuff on your bare arm Adjust snuggly, so that only two fingertips can fit between your skin and the top of the cuff Check 2 readings separated by at least one minute Keep a log of your BP readings For a visual, please reference this diagram: http://ccnc.care/bpdiagram  Provider Name: [facility]     Phone: [phone]  Zone 1: ALL CLEAR  Continue to monitor your symptoms:  BP reading is less than 140 (top number) or less than 90 (bottom number)  No right upper stomach pain No headaches or seeing spots No feeling nauseated or throwing up No swelling in face and hands  Zone 2: CAUTION Call your doctor's office for any of the following:  BP reading is greater than 140 (top number) or greater than 90 (bottom number)  Stomach pain under your ribs in the middle or right side Headaches or seeing spots Feeling nauseated or throwing up Swelling in face and hands  Zone 3: EMERGENCY  Seek immediate medical care if you have any of the following:  BP reading is greater than160 (top number) or greater than 110 (bottom number) Severe headaches not improving with Tylenol Serious difficulty catching your breath Any worsening symptoms from Zone 2  Preterm Labor and Birth Information  The normal length of a pregnancy is 39-41 weeks. Preterm labor is when labor starts before 37 completed weeks of pregnancy. What are the risk factors for preterm labor? Preterm labor is more likely to occur in women who: Have certain infections during pregnancy such as a bladder infection, sexually transmitted infection, or infection inside the uterus (chorioamnionitis). Have a shorter-than-normal cervix. Have gone into preterm labor before. Have had surgery on their cervix. Are younger than age 17  or older than age 35. Are African American. Are pregnant with twins or multiple babies (multiple gestation). Take street drugs or smoke while pregnant. Do not gain enough weight while pregnant. Became pregnant shortly after having been pregnant. What are the symptoms of preterm labor? Symptoms of preterm labor include: Cramps similar to those that can happen during a menstrual period. The cramps may happen with diarrhea. Pain in the abdomen or lower back. Regular uterine contractions that may feel like tightening of the abdomen. A feeling of increased pressure in the pelvis. Increased watery or bloody mucus discharge from the vagina. Water breaking (ruptured amniotic sac). Why is it important to recognize signs of preterm labor? It is important to recognize signs of preterm labor because babies who are born prematurely may not be fully developed. This can put them at an increased risk for: Long-term (chronic) heart and lung problems. Difficulty immediately after birth with regulating body systems, including blood sugar, body temperature, heart rate, and breathing rate. Bleeding in the brain. Cerebral palsy. Learning difficulties. Death. These risks are highest for babies who are born before 34 weeks   of pregnancy. How is preterm labor treated? Treatment depends on the length of your pregnancy, your condition, and the health of your baby. It may involve: Having a stitch (suture) placed in your cervix to prevent your cervix from opening too early (cerclage). Taking or being given medicines, such as: Hormone medicines. These may be given early in pregnancy to help support the pregnancy. Medicine to stop contractions. Medicines to help mature the baby's lungs. These may be prescribed if the risk of delivery is high. Medicines to prevent your baby from developing cerebral palsy. If the labor happens before 34 weeks of pregnancy, you may need to stay in the hospital. What should I do if I  think I am in preterm labor? If you think that you are going into preterm labor, call your health care provider right away. How can I prevent preterm labor in future pregnancies? To increase your chance of having a full-term pregnancy: Do not use any tobacco products, such as cigarettes, chewing tobacco, and e-cigarettes. If you need help quitting, ask your health care provider. Do not use street drugs or medicines that have not been prescribed to you during your pregnancy. Talk with your health care provider before taking any herbal supplements, even if you have been taking them regularly. Make sure you gain a healthy amount of weight during your pregnancy. Watch for infection. If you think that you might have an infection, get it checked right away. Make sure to tell your health care provider if you have gone into preterm labor before. This information is not intended to replace advice given to you by your health care provider. Make sure you discuss any questions you have with your health care provider. Document Revised: 08/13/2018 Document Reviewed: 09/12/2015 Elsevier Patient Education  2020 Elsevier Inc.   

## 2022-09-16 ENCOUNTER — Ambulatory Visit: Payer: Managed Care, Other (non HMO) | Admitting: Women's Health

## 2022-09-16 ENCOUNTER — Encounter: Payer: Self-pay | Admitting: Women's Health

## 2022-09-16 VITALS — BP 106/68 | HR 96 | Wt 147.0 lb

## 2022-09-16 DIAGNOSIS — Z3483 Encounter for supervision of other normal pregnancy, third trimester: Secondary | ICD-10-CM

## 2022-09-16 DIAGNOSIS — Z364 Encounter for antenatal screening for fetal growth retardation: Secondary | ICD-10-CM

## 2022-09-16 DIAGNOSIS — Z3480 Encounter for supervision of other normal pregnancy, unspecified trimester: Secondary | ICD-10-CM

## 2022-09-16 NOTE — Progress Notes (Signed)
LOW-RISK PREGNANCY VISIT Patient name: Kelly Watts MRN [id number]  Date of birth: 12-06-1995 Chief Complaint:   Routine Prenatal Visit  History of Present Illness:   Kelly Watts is a 27 y.o. [id number] female at [redacted]w[redacted]d with an Estimated Date of Delivery: 10/29/22 being seen today for ongoing management of a low-risk pregnancy.   Today she reports constipation, pain w/ bm. Contractions: Not present.  .  Movement: Present. denies leaking of fluid.     04/17/2022    2:39 PM 10/29/2021   11:00 AM 10/29/2021   10:55 AM 12/05/2019    9:16 AM 08/24/2019    3:37 PM  Depression screen PHQ 2/9  Decreased Interest 0 0 0 0 0  Down, Depressed, Hopeless 0 0 0 0 0  PHQ - 2 Score 0 0 0 0 0  Altered sleeping 3 0 0 0 0  Tired, decreased energy 0 0 0 0 1  Change in appetite 0 0 0 0 0  Feeling bad or failure about yourself  0 0 0 0 0  Trouble concentrating 0 0 0 0 0  Moving slowly or fidgety/restless 0 0 0 0 0  Suicidal thoughts 0 0 0 0 0  PHQ-9 Score 3 0 0 0 1  Difficult doing work/chores     Not difficult at all        04/17/2022    2:39 PM 10/29/2021   10:55 AM 12/05/2019    9:16 AM 08/24/2019    3:37 PM  GAD 7 : Generalized Anxiety Score  Nervous, Anxious, on Edge 0 0 0 0  Control/stop worrying 0 0 0 0  Worry too much - different things 0 0 0 0  Trouble relaxing 0 0 0 0  Restless 0 0 0 0  Easily annoyed or irritable 0 0 0 0  Afraid - awful might happen 0 0 0 0  Total GAD 7 Score 0 0 0 0  Anxiety Difficulty    Not difficult at all      Review of Systems:   Pertinent items are noted in HPI Denies abnormal vaginal discharge w/ itching/odor/irritation, headaches, visual changes, shortness of breath, chest pain, abdominal pain, severe nausea/vomiting, or problems with urination or bowel movements unless otherwise stated above. Pertinent History Reviewed:  Reviewed past medical,surgical, social, obstetrical and family history.  Reviewed problem list, medications and  allergies. Physical Assessment:   Vitals:   09/16/22 1511  BP: 106/68  Pulse: 96  Weight: 147 lb (66.7 kg)  Body mass index is 26.04 kg/m.        Physical Examination:   General appearance: Well appearing, and in no distress  Mental status: Alert, oriented to person, place, and time  Skin: Warm & dry  Cardiovascular: Normal heart rate noted  Respiratory: Normal respiratory effort, no distress  Abdomen: Soft, gravid, nontender  Pelvic: Cervical exam deferred         Extremities: Edema: None  Fetal Status: Fetal Heart Rate (bpm): 157 Fundal Height: 32 cm Movement: Present    Chaperone: N/A   No results found for this or any previous visit (from the past 24 hour(s)).  Assessment & Plan:  1) Low-risk pregnancy [id number] at [redacted]w[redacted]d with an Estimated Date of Delivery: 10/29/22   2) H/O 22wk PPROM w/ cord prolapse and IUFD    3) H/O FGR> 2021 pregnancy, 40wks, baby 5lb14.9oz, will get EFW u/s next visit  4) Constipation/pain w/ bm> gave printed prevention/relief measures    Meds: No  orders of the defined types were placed in this encounter.  Labs/procedures today: none  Plan:  Continue routine obstetrical care  Next visit: prefers will be in person for u/s and cultures     Reviewed: Preterm labor symptoms and general obstetric precautions including but not limited to vaginal bleeding, contractions, leaking of fluid and fetal movement were reviewed in detail with the patient.  All questions were answered. Does have home bp cuff. Office bp cuff given: not applicable. Check bp weekly, let us know if consistently >140 and/or >90.  Follow-up: Return in about 2 weeks (around 09/30/2022) for As scheduled.  Future Appointments  Date Time Provider Department Center  10/02/2022 11:30 AM [facility] [place] [facility] None  10/02/2022  1:30 PM Myna Hidalgo, DO [facility]    Orders Placed This Encounter  Procedures   US OB Follow Up   Cheral Marker CNM, [facility] 09/16/2022 3:31 PM

## 2022-09-16 NOTE — Patient Instructions (Addendum)
Toni Amend, thank you for choosing our office today! We appreciate the opportunity to meet your healthcare needs. You may receive a short survey by mail, e-mail, or through [employer]. If you are happy with your care we would appreciate if you could take just a few minutes to complete the survey questions. We read all of your comments and take your feedback very seriously. Thank you again for choosing our office.  [facility] [employer] Team at [facility]  [facility] ([address] [place], [place] [address]) Entrance C, located off of [address] 24/7 valet parking   Constipation Drink plenty of fluid, preferably water, throughout the day Eat foods high in fiber such as fruits, vegetables, and grains Exercise, such as walking, is a good way to keep your bowels regular Drink warm fluids, especially warm prune juice, or decaf coffee Eat a 1/2 cup of real oatmeal (not instant), 1/2 cup applesauce, and 1/2-1 cup warm prune juice every day If needed, you may take Colace (docusate sodium) stool softener once or twice a day to help keep the stool soft. If you are pregnant, wait until you are out of your first trimester (12-14 weeks of pregnancy) If you still are having problems with constipation, you may take Miralax once daily as needed to help keep your bowels regular.  If you are pregnant, wait until you are out of your first trimester (12-14 weeks of pregnancy)    CLASSES: Go to [email or web address] to register for classes (childbirth, breastfeeding, waterbirth, infant CPR, daddy bootcamp, etc.)  Call the office [phone]) or go to [facility] if: You begin to have strong, frequent contractions Your water breaks.  Sometimes it is a big gush of fluid, sometimes it is just a trickle that keeps getting your panties wet or running down your legs You have vaginal bleeding.  It is normal to have a small amount of spotting if your cervix was checked.  You  don't feel your baby moving like normal.  If you don't, get you something to eat and drink and lay down and focus on feeling your baby move.   If your baby is still not moving like normal, you should call the office or go to [facility].  Call the office [phone]) or go to [facility] for these signs of pre-eclampsia: Severe headache that does not go away with Tylenol Visual changes- seeing spots, double, blurred vision Pain under your right breast or upper abdomen that does not go away with Tums or heartburn medicine Nausea and/or vomiting Severe swelling in your hands, feet, and face   Tdap Vaccine It is recommended that you get the Tdap vaccine during the third trimester of EACH pregnancy to help protect your baby from getting pertussis (whooping cough) 27-36 weeks is the BEST time to do this so that you can pass the protection on to your baby. During pregnancy is better than after pregnancy, but if you are unable to get it during pregnancy it will be offered at the hospital.  You can get this vaccine with [place], at the health department, your family doctor, or some local pharmacies Everyone who will be around your baby should also be up-to-date on their vaccines before the baby comes. Adults (who are not pregnant) only need 1 dose of Tdap during adulthood.   [facility] Pediatricians/Family Doctors [facility]): [address] Colette Ribas, [phone]           [facility]:  [place], [facility]): [place], [phone] (call to ask if accepting patients) [facility] Department: [address], [place], [place] Pediatricians/Family Doctors [facility]): [phone] [address], [place] [facility]: [address] [place], [place] [facility] [place]: [place], [place] [facility]): [phone] [facility]: [address] [place]: [address], [place] [facility]: [phone], [phone]  Home Blood Pressure Monitoring for Patients   Your provider has recommended that you check your blood pressure (BP) at least once a week at home. If you do not have a blood pressure cuff at home, one will be provided for you. Contact your provider if you have not received your monitor within 1 week.   Helpful Tips for Accurate Home Blood Pressure Checks  Don't smoke, exercise, or drink caffeine 30 minutes before checking your BP Use the restroom before checking your BP (a full bladder can raise your pressure) Relax in a comfortable upright chair Feet on the ground Left arm resting comfortably on a flat surface at the level of your heart Legs uncrossed Back supported Sit quietly and don't talk Place the cuff on your bare arm Adjust snuggly, so that only two fingertips can fit between your skin and the top of the cuff Check 2 readings separated by at least one minute Keep a log of your BP readings For a visual, please reference this diagram: http://ccnc.care/bpdiagram  Provider Name: [facility]     Phone: [phone]  Zone 1: ALL CLEAR  Continue to monitor your symptoms:  BP reading is less than 140 (top number) or less than 90 (bottom number)  No right upper stomach pain No headaches or seeing spots No feeling nauseated or throwing up No swelling in face and hands  Zone 2: CAUTION Call your doctor's office for any of the following:  BP reading is greater than 140 (top number) or greater than 90 (bottom number)  Stomach pain under your ribs in the middle or right side Headaches or seeing spots Feeling nauseated or throwing up Swelling in face and hands  Zone 3:  EMERGENCY  Seek immediate medical care if you have any of the following:  BP reading is greater than160 (top number) or greater than 110 (bottom number) Severe headaches not improving with Tylenol Serious difficulty catching your breath Any worsening symptoms from Zone 2  Preterm Labor and Birth Information  The normal length of a pregnancy is 39-41 weeks. Preterm labor is when labor starts before 37 completed weeks of pregnancy. What are the risk factors for preterm labor? Preterm labor is more likely to occur in women who: Have certain infections during pregnancy such as a bladder infection, sexually transmitted infection, or infection inside the uterus (chorioamnionitis). Have a shorter-than-normal cervix. Have gone into preterm labor before. Have had surgery on their cervix. Are younger than age 78 or older than age 31. Are African American. Are pregnant with twins or multiple babies (multiple gestation). Take street drugs or smoke while pregnant. Do not gain enough weight while pregnant. Became pregnant shortly after having been pregnant. What are the symptoms of preterm labor? Symptoms  of preterm labor include: Cramps similar to those that can happen during a menstrual period. The cramps may happen with diarrhea. Pain in the abdomen or lower back. Regular uterine contractions that may feel like tightening of the abdomen. A feeling of increased pressure in the pelvis. Increased watery or bloody mucus discharge from the vagina. Water breaking (ruptured amniotic sac). Why is it important to recognize signs of preterm labor? It is important to recognize signs of preterm labor because babies who are born prematurely may not be fully developed. This can put them at an increased risk for: Long-term (chronic) heart and lung problems. Difficulty immediately after birth with regulating body systems, including blood sugar, body temperature, heart rate, and breathing rate. Bleeding in the  brain. Cerebral palsy. Learning difficulties. Death. These risks are highest for babies who are born before 32 weeks of pregnancy. How is preterm labor treated? Treatment depends on the length of your pregnancy, your condition, and the health of your baby. It may involve: Having a stitch (suture) placed in your cervix to prevent your cervix from opening too early (cerclage). Taking or being given medicines, such as: Hormone medicines. These may be given early in pregnancy to help support the pregnancy. Medicine to stop contractions. Medicines to help mature the baby's lungs. These may be prescribed if the risk of delivery is high. Medicines to prevent your baby from developing cerebral palsy. If the labor happens before 34 weeks of pregnancy, you may need to stay in the hospital. What should I do if I think I am in preterm labor? If you think that you are going into preterm labor, call your health care provider right away. How can I prevent preterm labor in future pregnancies? To increase your chance of having a full-term pregnancy: Do not use any tobacco products, such as cigarettes, chewing tobacco, and e-cigarettes. If you need help quitting, ask your health care provider. Do not use street drugs or medicines that have not been prescribed to you during your pregnancy. Talk with your health care provider before taking any herbal supplements, even if you have been taking them regularly. Make sure you gain a healthy amount of weight during your pregnancy. Watch for infection. If you think that you might have an infection, get it checked right away. Make sure to tell your health care provider if you have gone into preterm labor before. This information is not intended to replace advice given to you by your health care provider. Make sure you discuss any questions you have with your health care provider. Document Revised: 08/13/2018 Document Reviewed: 09/12/2015 Elsevier Patient Education   [place].

## 2022-09-24 ENCOUNTER — Other Ambulatory Visit: Payer: Managed Care, Other (non HMO)

## 2022-09-24 ENCOUNTER — Encounter (HOSPITAL_COMMUNITY): Payer: Self-pay | Admitting: Obstetrics and Gynecology

## 2022-09-24 ENCOUNTER — Inpatient Hospital Stay (HOSPITAL_COMMUNITY)
Admission: AD | Admit: 2022-09-24 | Discharge: 2022-09-24 | Disposition: A | Discharge status: Home / Self Care | Payer: Managed Care, Other (non HMO) | Attending: Obstetrics and Gynecology | Admitting: Obstetrics and Gynecology

## 2022-09-24 ENCOUNTER — Inpatient Hospital Stay (HOSPITAL_COMMUNITY): Payer: Managed Care, Other (non HMO)

## 2022-09-24 VITALS — BP 131/83 | HR 107

## 2022-09-24 DIAGNOSIS — R109 Unspecified abdominal pain: Secondary | ICD-10-CM | POA: Diagnosis not present

## 2022-09-24 DIAGNOSIS — O26893 Other specified pregnancy related conditions, third trimester: Secondary | ICD-10-CM | POA: Diagnosis not present

## 2022-09-24 DIAGNOSIS — N132 Hydronephrosis with renal and ureteral calculous obstruction: Secondary | ICD-10-CM | POA: Diagnosis present

## 2022-09-24 DIAGNOSIS — N1330 Unspecified hydronephrosis: Secondary | ICD-10-CM | POA: Diagnosis not present

## 2022-09-24 DIAGNOSIS — N200 Calculus of kidney: Secondary | ICD-10-CM

## 2022-09-24 DIAGNOSIS — Z3480 Encounter for supervision of other normal pregnancy, unspecified trimester: Secondary | ICD-10-CM

## 2022-09-24 DIAGNOSIS — Z3A35 35 weeks gestation of pregnancy: Secondary | ICD-10-CM | POA: Insufficient documentation

## 2022-09-24 DIAGNOSIS — Z3483 Encounter for supervision of other normal pregnancy, third trimester: Secondary | ICD-10-CM

## 2022-09-24 DIAGNOSIS — R350 Frequency of micturition: Secondary | ICD-10-CM

## 2022-09-24 LAB — URINALYSIS, ROUTINE W REFLEX MICROSCOPIC
Bilirubin Urine: NEGATIVE
Glucose, UA: NEGATIVE mg/dL
Ketones, ur: NEGATIVE mg/dL
Leukocytes,Ua: NEGATIVE
Nitrite: NEGATIVE
Protein, ur: NEGATIVE mg/dL
Specific Gravity, Urine: 1.002 — ABNORMAL LOW (ref 1.005–1.030)
pH: 7 (ref 5.0–8.0)

## 2022-09-24 LAB — POCT URINALYSIS DIPSTICK OB
Glucose, UA: NEGATIVE
Ketones, UA: NEGATIVE
Leukocytes, UA: NEGATIVE
Nitrite, UA: NEGATIVE
POC,PROTEIN,UA: NEGATIVE

## 2022-09-24 MED ORDER — TAMSULOSIN HCL 0.4 MG PO CAPS: 0.4000 mg | ORAL_CAPSULE | Freq: Every day | ORAL | 0 refills | Status: DC

## 2022-09-24 MED ORDER — OXYCODONE-ACETAMINOPHEN 5-325 MG PO TABS: 1.0000 | ORAL_TABLET | Freq: Four times a day (QID) | ORAL | 0 refills | Status: DC | PRN

## 2022-09-24 NOTE — MAU Note (Signed)
.  Kelly Watts is a 27 y.o. at [redacted]w[redacted]d here in MAU reporting: right sided pain x 2 days, worsening today. Reports pain is mostly in the right back area and right side. Denies bleeding. Pain is constant. Denies dysuria. Reports positive fetal movement.  Onset of complaint: 2 days ago.  Pain score: 5/10 Vitals:   09/24/22 2017  BP: 123/74  Pulse: (!) 102  Resp: 18  Temp: 97.6 F (36.4 C)  SpO2: 97%     FHT:155 Lab orders placed from triageurine

## 2022-09-24 NOTE — Progress Notes (Signed)
   NURSE VISIT- UTI SYMPTOMS   SUBJECTIVE:  Kelly Watts is a 27 y.o. [phone] female here for UTI symptoms. She is [redacted]w[redacted]d pregnant. She reports urinary frequency and right flank pain . Has a history of kidney stones and feels like she now has one on the right side as pain is very similar.  Has been taking Flomax and Percocet since yesterday.   OBJECTIVE:  BP 131/83   Pulse (!) 107   LMP 01/22/2022   Appears well, in no apparent distress  Results for orders placed or performed in visit on 09/24/22 (from the past 24 hour(s))  POC Urinalysis Dipstick OB   Collection Time: 09/24/22  3:18 PM  Result Value Ref Range   Color, UA     Clarity, UA     Glucose, UA Negative Negative   Bilirubin, UA     Ketones, UA neg    Spec Grav, UA     Blood, UA trace    pH, UA     POC,PROTEIN,UA Negative Negative, Trace, Small (1+), Moderate (2+), Large (3+), 4+   Urobilinogen, UA     Nitrite, UA neg    Leukocytes, UA Negative Negative   Appearance     Odor      ASSESSMENT: Pregnancy 108w0d with UTI symptoms and negative nitrites  PLAN: Discussed with Philipp Deputy, CNM   Rx sent by provider today: No Urine culture not sent Call or return to clinic prn if these symptoms worsen or fail to improve as anticipated. Follow-up: as scheduled Advised to continue taking FLomax and Percocet and if those do not help with pain, to go to MAU for fluids and IV pain medicine   Jobe Marker  09/24/2022 3:19 PM

## 2022-09-24 NOTE — MAU Provider Note (Incomplete)
Chief Complaint:  Back Pain and Abdominal Pain   Event Date/Time   First Provider Initiated Contact with Patient 09/24/22 2057     HPI  HPI: Kelly Watts is a 27 y.o. [id number] at 66w0dwho presents to maternity admissions reporting ***. She reports good fetal movement, denies LOF, vaginal bleeding, vaginal itching/burning, urinary symptoms, h/a, dizziness, n/v, diarrhea, constipation or fever/chills.  She denies headache, visual changes or RUQ abdominal pain.   Past Medical History: Past Medical History:  Diagnosis Date   Angio-edema    Asthma    Eczema    Encounter for Nexplanon removal 11/16/2015   History of kidney stones    History of kidney stones    Kidney infection    Miscarriage    Nexplanon insertion 11/02/2012   Inserted left arm 11/02/12   Patient underweight    Recurrent upper respiratory infection (URI)    Urticaria     Past obstetric history: OB History  Gravida Para Term Preterm AB Living  6 3 2 1 2 2   SAB IAB Ectopic Multiple Live Births  2     0 2    # Outcome Date GA Lbr Len/2nd Weight Sex Delivery Anes PTL Lv  6 Current           5 SAB 07/2021          4 Term 03/07/20 [redacted]w[redacted]d 03:50 / 00:06 2690 g F Vag-Spont EPI N LIV  3 SAB 2020          2 Term 08/11/17 [redacted]w[redacted]d 15:19 / 01:00 3530 g F Vag-Spont EPI N LIV  1 Preterm 07/25/12 [redacted]w[redacted]d 07:35 / 00:04 425 g F Vag-Spont None  FD    Past Surgical History: Past Surgical History:  Procedure Laterality Date   DILATION AND CURETTAGE OF UTERUS N/A 08/06/2021   Procedure: SUCTION DILATATION AND CURETTAGE;  Surgeon: Myna Hidalgo, DO;  Location: [facility];  Service: Gynecology;  Laterality: N/A;  pt knows to be NPO, will arrive at 10:00 - no PAT   EAR TUBE REMOVAL     EXTRACORPOREAL SHOCK WAVE LITHOTRIPSY Left 10/30/2016   Procedure: LEFT EXTRACORPOREAL SHOCK WAVE LITHOTRIPSY (ESWL);  Surgeon: Crist Fat, MD;  Location: [facility];  Service: Urology;  Laterality: Left;   TYMPANOSTOMY TUBE PLACEMENT     WISDOM TOOTH  EXTRACTION      Family History: Family History  Problem Relation Age of Onset   Allergic rhinitis Father    Asthma Mother    Allergic rhinitis Mother    Coronary artery disease Paternal Grandfather    Cancer Paternal Grandmother        breast   Diabetes Maternal Grandmother    Thyroid disease Maternal Grandmother    Heart disease Maternal Grandmother    Melanoma Maternal Grandfather    Allergic rhinitis Brother    Allergic rhinitis Brother    Angioedema Neg Hx    Atopy Neg Hx    Eczema Neg Hx    Immunodeficiency Neg Hx    Urticaria Neg Hx     Social History: Social History   Tobacco Use   Smoking status: Former    Packs/day: 0.25    Years: 3.00    Additional pack years: 0.00    Total pack years: 0.75    Types: Cigarettes    Quit date: 01/30/2016    Years since quitting: 6.6   Smokeless tobacco: Never  Vaping Use   Vaping Use: Never used  Substance Use Topics   Alcohol use:  Not Currently    Comment: occ   Drug use: No    Allergies:  Allergies  Allergen Reactions   Penicillins Hives    Has patient had a PCN reaction causing immediate rash, facial/tongue/throat swelling, SOB or lightheadedness with hypotension: Yes Has patient had a PCN reaction causing severe rash involving mucus membranes or skin necrosis: No Has patient had a PCN reaction that required hospitalization: No Has patient had a PCN reaction occurring within the last 10 years: No If all of the above answers are "NO", then may proceed with Cephalosporin use.    Sulfa Antibiotics Hives    Meds:  Medications Prior to Admission  Medication Sig Dispense Refill Last Dose   aspirin EC 81 MG tablet Take 2 tablets (162 mg total) by mouth daily. 60 tablet 6 09/23/2022   oxyCODONE-acetaminophen (PERCOCET/ROXICET) 5-325 MG tablet Take 1 tablet by mouth every 4 (four) hours as needed for severe pain. 15 tablet 0 09/24/2022 at 1515   Prenatal Vit-Fe Fumarate-FA (PRENATAL VITAMIN PO) Take by mouth.   09/23/2022    promethazine (PHENERGAN) 25 MG tablet Take 25 mg by mouth every 6 (six) hours as needed for nausea or vomiting.   t at 1515   tamsulosin (FLOMAX) 0.4 MG CAPS capsule Take 1 capsule (0.4 mg total) by mouth daily. 10 capsule 0 09/24/2022   nitrofurantoin, macrocrystal-monohydrate, (MACROBID) 100 MG capsule Take 1 capsule (100 mg total) by mouth 2 (two) times daily. (Patient not taking: Reported on 07/01/2022) 10 capsule 0     I have reviewed patient's Past Medical Hx, Surgical Hx, Family Hx, Social Hx, medications and allergies.   ROS:  Review of Systems Other systems negative  Physical Exam  Patient Vitals for the past 24 hrs:  BP Temp Temp src Pulse Resp SpO2 Height Weight  09/24/22 2017 123/74 97.6 F (36.4 C) Oral (!) 102 18 97 % 5\' 3"  (1.6 m) 66.7 kg   Constitutional: Well-developed, well-nourished female in no acute distress.  Cardiovascular: normal rate and rhythm Respiratory: normal effort, clear to auscultation bilaterally GI: Abd soft, non-tender, gravid appropriate for gestational age.   No rebound or guarding. MS: Extremities nontender, no edema, normal ROM Neurologic: Alert and oriented x 4.  GU: Neg CVAT.  PELVIC EXAM: Cervix pink, visually closed, without lesion, scant white creamy discharge, vaginal walls and external genitalia normal Bimanual exam: Cervix firm, posterior, neg CMT, uterus nontender, Fundal Height consistent with dates, adnexa without tenderness, enlargement, or mass     FHT:  Baseline *** , moderate variability, accelerations present, no decelerations Contractions: q *** mins Irregular  Rare   Labs: Results for orders placed or performed during the hospital encounter of 09/24/22 (from the past 24 hour(s))  Urinalysis, Routine w reflex microscopic -Urine, Clean Catch     Status: Abnormal   Collection Time: 09/24/22  8:11 PM  Result Value Ref Range   Color, Urine STRAW (A) YELLOW   APPearance CLEAR CLEAR   Specific Gravity, Urine 1.002 (L) 1.005 -  1.030   pH 7.0 5.0 - 8.0   Glucose, UA NEGATIVE NEGATIVE mg/dL   Hgb urine dipstick MODERATE (A) NEGATIVE   Bilirubin Urine NEGATIVE NEGATIVE   Ketones, ur NEGATIVE NEGATIVE mg/dL   Protein, ur NEGATIVE NEGATIVE mg/dL   Nitrite NEGATIVE NEGATIVE   Leukocytes,Ua NEGATIVE NEGATIVE   RBC / HPF 0-5 0 - 5 RBC/hpf   WBC, UA 0-5 0 - 5 WBC/hpf   Bacteria, UA RARE (A) NONE SEEN   Squamous Epithelial /  HPF 6-10 0 - 5 /HPF   O/Negative/-- (12/14 1525)  Imaging:  US RENAL  Result Date: 09/24/2022 CLINICAL DATA:  Right-sided pain for 2 days EXAM: RENAL / URINARY TRACT ULTRASOUND COMPLETE COMPARISON:  Renal ultrasound 06/18/2022 and CT abdomen and pelvis 09/21/2017 FINDINGS: Right Kidney: Renal measurements: 12.4 x 6.3 x 5.5 cm = volume: 226 mL. Echogenicity within normal limits. Moderate hydronephrosis. This is new since ultrasound 06/18/2022. Left Kidney: Renal measurements: 10.6 x 5.6 x 5.3 cm = volume: 164 mL. Echogenicity within normal limits. Shadowing 7 mm [place] similar to 09/21/2017. Mild hydronephrosis, similar to decreased from 06/18/2022. Bladder: Appears normal for degree of bladder distention. Other: None. IMPRESSION: 1. Moderate right hydronephrosis, new from ultrasound 06/18/2022. 2. Mild left hydronephrosis, similar to decreased from 06/18/2022. 3. 7 mm left renal [place], unchanged from CT 09/21/2017. Electronically Signed   By: Minerva Fester M.D.   On: 09/24/2022 22:16    MAU Course/MDM: I have reviewed the triage vital signs and the nursing notes.   Pertinent labs & imaging results that were available during my care of the patient were reviewed by me and considered in my medical decision making (see chart for details).      I have reviewed her medical records including past results, notes and treatments.   I have ordered labs and reviewed results.  NST reviewed Consult *** with presentation, exam findings and test results.  Treatments in MAU included ***.    Assessment: 1. Right  flank pain   2. [redacted] weeks gestation of pregnancy   3. Hydronephrosis of right kidney   4. Kidney Spoon on left side     Plan: Discharge home Labor precautions and fetal kick counts Follow up in Office for prenatal visits and recheck  Follow-up Information     [facility] Follow up.   Contact information: [address] [place] [address] [phone]        [facility] Follow up.   Contact information: [address] [place] [address] [place] [address] [phone]                Pt stable at time of discharge.  Wynelle Bourgeois CNM, MSN Certified Nurse-Midwife 09/24/2022 10:40 PM

## 2022-09-25 ENCOUNTER — Other Ambulatory Visit: Payer: Self-pay | Admitting: Advanced Practice Midwife

## 2022-09-25 DIAGNOSIS — N200 Calculus of kidney: Secondary | ICD-10-CM

## 2022-09-25 NOTE — Progress Notes (Signed)
Seen for bilateral stones Right mod hydronephrosis

## 2022-10-02 ENCOUNTER — Ambulatory Visit: Payer: Managed Care, Other (non HMO) | Admitting: Obstetrics & Gynecology

## 2022-10-02 ENCOUNTER — Encounter: Payer: Self-pay | Admitting: Obstetrics & Gynecology

## 2022-10-02 ENCOUNTER — Ambulatory Visit: Payer: Managed Care, Other (non HMO)

## 2022-10-02 ENCOUNTER — Other Ambulatory Visit (HOSPITAL_COMMUNITY)
Admission: RE | Admit: 2022-10-02 | Discharge: 2022-10-02 | Disposition: A | Discharge status: Home / Self Care | Payer: Managed Care, Other (non HMO) | Source: Ambulatory Visit | Attending: Obstetrics & Gynecology | Admitting: Obstetrics & Gynecology

## 2022-10-02 VITALS — BP 117/78 | HR 104 | Wt 146.0 lb

## 2022-10-02 DIAGNOSIS — Z364 Encounter for antenatal screening for fetal growth retardation: Secondary | ICD-10-CM | POA: Diagnosis not present

## 2022-10-02 DIAGNOSIS — Z3483 Encounter for supervision of other normal pregnancy, third trimester: Secondary | ICD-10-CM | POA: Insufficient documentation

## 2022-10-02 DIAGNOSIS — Z3A36 36 weeks gestation of pregnancy: Secondary | ICD-10-CM

## 2022-10-02 DIAGNOSIS — Z3480 Encounter for supervision of other normal pregnancy, unspecified trimester: Secondary | ICD-10-CM

## 2022-10-02 NOTE — Progress Notes (Signed)
[place] 36+1 wks,cephalic,anterior placenta gr 3,AFI 13 cm,efw 159 BPM,EFW 2716 g 37%

## 2022-10-02 NOTE — Progress Notes (Signed)
   LOW-RISK PREGNANCY VISIT Patient name: Kelly Watts MRN [id number]  Date of birth: 10/25/1995 Chief Complaint:   Routine Prenatal Visit  History of Present Illness:   Kelly Watts is a 27 y.o. [id number] female at [redacted]w[redacted]d with an Estimated Date of Delivery: 10/29/22 being seen today for ongoing management of a low-risk pregnancy.   -O neg -h/o 22wk PPROM     04/17/2022    2:39 PM 10/29/2021   11:00 AM 10/29/2021   10:55 AM 12/05/2019    9:16 AM 08/24/2019    3:37 PM  Depression screen PHQ 2/9  Decreased Interest 0 0 0 0 0  Down, Depressed, Hopeless 0 0 0 0 0  PHQ - 2 Score 0 0 0 0 0  Altered sleeping 3 0 0 0 0  Tired, decreased energy 0 0 0 0 1  Change in appetite 0 0 0 0 0  Feeling bad or failure about yourself  0 0 0 0 0  Trouble concentrating 0 0 0 0 0  Moving slowly or fidgety/restless 0 0 0 0 0  Suicidal thoughts 0 0 0 0 0  PHQ-9 Score 3 0 0 0 1  Difficult doing work/chores     Not difficult at all    Today she reports no complaints. Contractions: Not present. Vag. Bleeding: None.  Movement: Present. denies leaking of fluid. Review of Systems:   Pertinent items are noted in HPI Denies abnormal vaginal discharge w/ itching/odor/irritation, headaches, visual changes, shortness of breath, chest pain, abdominal pain, severe nausea/vomiting, or problems with urination or bowel movements unless otherwise stated above. Pertinent History Reviewed:  Reviewed past medical,surgical, social, obstetrical and family history.  Reviewed problem list, medications and allergies.  Physical Assessment:   Vitals:   10/02/22 1208  BP: 117/78  Pulse: (!) 104  Weight: 146 lb (66.2 kg)  Body mass index is 25.86 kg/m.        Physical Examination:   General appearance: Well appearing, and in no distress  Mental status: Alert, oriented to person, place, and time  Skin: Warm & dry  Respiratory: Normal respiratory effort, no distress  Abdomen: Soft, gravid, nontender  Pelvic: Cervical  exam performed  Dilation: 1 Effacement (%): 20 Station: -3 vaginal swabs obtained  Extremities:    Psych:  mood and affect appropriate  Fetal Status:     Movement: Present    Chaperone:  Faith Rogue     No results found for this or any previous visit (from the past 24 hour(s)).   Assessment & Plan:  1) Low-risk pregnancy [id number] at [redacted]w[redacted]d with an Estimated Date of Delivery: 10/29/22   -O neg, s/p RhoGAM -Reviewed options-May consider post placenta ParaGard   Meds: No orders of the defined types were placed in this encounter.  Labs/procedures today: GBS, GC  Plan:  Continue routine obstetrical care  Next visit: prefers in person    Reviewed: Term labor symptoms and general obstetric precautions including but not limited to vaginal bleeding, contractions, leaking of fluid and fetal movement were reviewed in detail with the patient.  All questions were answered.  Patient has home bp cuff. Check bp weekly, let us know if >140/90.   Follow-up: Return in about 1 week (around 10/09/2022) for LROB visit.  Orders Placed This Encounter  Procedures   Strep Gp B NAA+Rflx    Myna Hidalgo, DO Attending Obstetrician & Gynecologist, [facility] for [employer], [facility]

## 2022-10-03 LAB — CERVICOVAGINAL ANCILLARY ONLY
Chlamydia: NEGATIVE
Comment: NEGATIVE
Comment: NORMAL
Neisseria Gonorrhea: NEGATIVE

## 2022-10-04 LAB — STREP GP B NAA+RFLX: Strep Gp B NAA+Rflx: NEGATIVE

## 2022-10-09 ENCOUNTER — Ambulatory Visit: Payer: Managed Care, Other (non HMO) | Admitting: Advanced Practice Midwife

## 2022-10-09 ENCOUNTER — Encounter: Payer: Self-pay | Admitting: Advanced Practice Midwife

## 2022-10-09 VITALS — BP 120/76 | HR 106 | Wt 148.0 lb

## 2022-10-09 DIAGNOSIS — Z3483 Encounter for supervision of other normal pregnancy, third trimester: Secondary | ICD-10-CM

## 2022-10-09 DIAGNOSIS — Z3480 Encounter for supervision of other normal pregnancy, unspecified trimester: Secondary | ICD-10-CM

## 2022-10-09 DIAGNOSIS — Z3A37 37 weeks gestation of pregnancy: Secondary | ICD-10-CM

## 2022-10-09 NOTE — Progress Notes (Signed)
   LOW-RISK PREGNANCY VISIT Patient name: Kelly Watts MRN [id number]  Date of birth: 1996/03/28 Chief Complaint:   Routine Prenatal Visit  History of Present Illness:   Kelly Watts is a 27 y.o. [id number] female at [redacted]w[redacted]d with an Estimated Date of Delivery: 10/29/22 being seen today for ongoing management of a low-risk pregnancy.  Today she reports no complaints. Contractions: Irregular. Vag. Bleeding: None.  Movement: Present. denies leaking of fluid. Review of Systems:   Pertinent items are noted in HPI Denies abnormal vaginal discharge w/ itching/odor/irritation, headaches, visual changes, shortness of breath, chest pain, abdominal pain, severe nausea/vomiting, or problems with urination or bowel movements unless otherwise stated above. Pertinent History Reviewed:  Reviewed past medical,surgical, social, obstetrical and family history.  Reviewed problem list, medications and allergies. Physical Assessment:   Vitals:   10/09/22 1026  BP: 120/76  Pulse: (!) 106  Weight: 148 lb (67.1 kg)  Body mass index is 26.22 kg/m.        Physical Examination:   General appearance: Well appearing, and in no distress  Mental status: Alert, oriented to person, place, and time  Skin: Warm & dry  Cardiovascular: Normal heart rate noted  Respiratory: Normal respiratory effort, no distress  Abdomen: Soft, gravid, nontender  Pelvic: Cervical exam performed  Dilation: 1 Effacement (%): Thick Station: -3  Extremities: Edema: None  Fetal Status: Fetal Heart Rate (bpm): 137 Fundal Height: 36 cm Movement: Present Presentation: Vertex  No results found for this or any previous visit (from the past 24 hour(s)).  Assessment & Plan:  1) Low-risk pregnancy [id number] at [redacted]w[redacted]d with an Estimated Date of Delivery: 10/29/22   2) Renal stones bilat, stable for now without pain meds   Meds: No orders of the defined types were placed in this encounter.  Labs/procedures today: SVE  Plan:  Continue routine  obstetrical care   Reviewed: Term labor symptoms and general obstetric precautions including but not limited to vaginal bleeding, contractions, leaking of fluid and fetal movement were reviewed in detail with the patient.  All questions were answered. Has home bp cuff. Check bp weekly, let us know if >140/90.   Follow-up: Return for As scheduled.  No orders of the defined types were placed in this encounter.  Arabella Merles CNM 10/09/2022 10:50 AM

## 2022-10-16 ENCOUNTER — Ambulatory Visit: Payer: Managed Care, Other (non HMO) | Admitting: Advanced Practice Midwife

## 2022-10-16 ENCOUNTER — Encounter: Payer: Self-pay | Admitting: Advanced Practice Midwife

## 2022-10-16 VITALS — BP 117/74 | HR 104 | Wt 149.0 lb

## 2022-10-16 DIAGNOSIS — Z3A38 38 weeks gestation of pregnancy: Secondary | ICD-10-CM

## 2022-10-16 DIAGNOSIS — Z3480 Encounter for supervision of other normal pregnancy, unspecified trimester: Secondary | ICD-10-CM

## 2022-10-16 NOTE — Patient Instructions (Signed)

## 2022-10-16 NOTE — Progress Notes (Signed)
   LOW-RISK PREGNANCY VISIT Patient name: Kelly Watts MRN [id number]  Date of birth: [birth year] Chief Complaint:   Routine Prenatal Visit (Cervical check)  History of Present Illness:   Kelly Watts is a 27 y.o. [id number] female at [redacted]w[redacted]d with an Estimated Date of Delivery: 10/29/22 being seen today for ongoing management of a low-risk pregnancy.  Today she reports no complaints. Wants to try to have baby on [facility]. Started RRl tea. Contractions: Not present.  .  Movement: Present. denies leaking of fluid. Review of Systems:   Pertinent items are noted in HPI Denies abnormal vaginal discharge w/ itching/odor/irritation, headaches, visual changes, shortness of breath, chest pain, abdominal pain, severe nausea/vomiting, or problems with urination or bowel movements unless otherwise stated above. Pertinent History Reviewed:  Reviewed past medical,surgical, social, obstetrical and family history.  Reviewed problem list, medications and allergies. Physical Assessment:   Vitals:   10/16/22 1051  BP: 117/74  Pulse: (!) 104  Weight: 149 lb (67.6 kg)  Body mass index is 26.39 kg/m.        Physical Examination:   General appearance: Well appearing, and in no distress  Mental status: Alert, oriented to person, place, and time  Skin: Warm & dry  Cardiovascular: Normal heart rate noted  Respiratory: Normal respiratory effort, no distress  Abdomen: Soft, gravid, nontender  Pelvic: Cervical exam performed  Dilation: 1.5 Effacement (%): Thick Station: -3  Extremities: Edema: Trace  Fetal Status: Fetal Heart Rate (bpm): 148 Fundal Height: 37 cm Movement: Present Presentation: Vertex  Chaperone:  Peggy Dones    No results found for this or any previous visit (from the past 24 hour(s)).  Assessment & Plan:    Pregnancy: [id number] at [redacted]w[redacted]d 1. Supervision of other normal pregnancy, antepartum   2. [redacted] weeks gestation of pregnancy Schedule appt day before [facility] for membrane sweep     Meds:  No orders of the defined types were placed in this encounter.  Labs/procedures today: none  Plan:  Continue routine obstetrical care      Reviewed: Term labor symptoms and general obstetric precautions including but not limited to vaginal bleeding, contractions, leaking of fluid and fetal movement were reviewed in detail with the patient.  All questions were answered. Has home bp cuff. Check bp weekly, let us know if >140/90.   Follow-up: No follow-ups on file.  Future Appointments  Date Time Provider Department Center  10/23/2022  8:50 AM Myna Hidalgo, DO [facility]  10/30/2022 11:30 AM Marzella Schlein, Scarlette Calico, CNM [facility]  11/17/2022  1:00 PM Jerilee Field, MD [facility] None    No orders of the defined types were placed in this encounter.  Jacklyn Shell DNP, CNM 10/16/2022 11:30 AM

## 2022-10-23 ENCOUNTER — Encounter: Payer: Self-pay | Admitting: Obstetrics & Gynecology

## 2022-10-23 ENCOUNTER — Ambulatory Visit: Payer: Managed Care, Other (non HMO) | Admitting: Obstetrics & Gynecology

## 2022-10-23 VITALS — BP 113/72 | HR 101 | Wt 149.4 lb

## 2022-10-23 DIAGNOSIS — Z3A39 39 weeks gestation of pregnancy: Secondary | ICD-10-CM

## 2022-10-23 DIAGNOSIS — Z3490 Encounter for supervision of normal pregnancy, unspecified, unspecified trimester: Secondary | ICD-10-CM

## 2022-10-23 DIAGNOSIS — Z3483 Encounter for supervision of other normal pregnancy, third trimester: Secondary | ICD-10-CM

## 2022-10-23 DIAGNOSIS — Z3480 Encounter for supervision of other normal pregnancy, unspecified trimester: Secondary | ICD-10-CM

## 2022-10-23 NOTE — Progress Notes (Signed)
   LOW-RISK PREGNANCY VISIT Patient name: Kelly Watts MRN [id number]  Date of birth: [birth year] Chief Complaint:   Routine Prenatal Visit  History of Present Illness:   Kelly Watts is a 27 y.o. [id number] female at [redacted]w[redacted]d with an Estimated Date of Delivery: 10/29/22 being seen today for ongoing management of a low-risk pregnancy.     04/17/2022    2:39 PM 10/29/2021   11:00 AM 10/29/2021   10:55 AM 12/05/2019    9:16 AM 08/24/2019    3:37 PM  Depression screen PHQ 2/9  Decreased Interest 0 0 0 0 0  Down, Depressed, Hopeless 0 0 0 0 0  PHQ - 2 Score 0 0 0 0 0  Altered sleeping 3 0 0 0 0  Tired, decreased energy 0 0 0 0 1  Change in appetite 0 0 0 0 0  Feeling bad or failure about yourself  0 0 0 0 0  Trouble concentrating 0 0 0 0 0  Moving slowly or fidgety/restless 0 0 0 0 0  Suicidal thoughts 0 0 0 0 0  PHQ-9 Score 3 0 0 0 1  Difficult doing work/chores     Not difficult at all    Today she reports no complaints. Contractions: Not present. Vag. Bleeding: None.  Movement: Present. denies leaking of fluid. Review of Systems:   Pertinent items are noted in HPI Denies abnormal vaginal discharge w/ itching/odor/irritation, headaches, visual changes, shortness of breath, chest pain, abdominal pain, severe nausea/vomiting, or problems with urination or bowel movements unless otherwise stated above. Pertinent History Reviewed:  Reviewed past medical,surgical, social, obstetrical and family history.  Reviewed problem list, medications and allergies.  Physical Assessment:   Vitals:   10/23/22 0853  BP: 113/72  Pulse: (!) 101  Weight: 149 lb 6.4 oz (67.8 kg)  Body mass index is 26.47 kg/m.        Physical Examination:   General appearance: Well appearing, and in no distress  Mental status: Alert, oriented to person, place, and time  Skin: Warm & dry  Respiratory: Normal respiratory effort, no distress  Abdomen: Soft, gravid, nontender  Pelvic: Cervical exam performed   Dilation: 1.5 Effacement (%): 30 Station: -3  Extremities:  no edema  Psych:  mood and affect appropriate  Fetal Status: Fetal Heart Rate (bpm): 145 Fundal Height: 37 cm Movement: Present    Chaperone:  pt declined     No results found for this or any previous visit (from the past 24 hour(s)).   Assessment & Plan:  1) Low-risk pregnancy [id number] at [redacted]w[redacted]d with an Estimated Date of Delivery: 10/29/22   -scheduled for elective IOL 6/25 and mIOL 1wk later -keep appt as scheduled   Meds: No orders of the defined types were placed in this encounter.  Labs/procedures today: doppler  Plan:  Continue routine obstetrical care  Next visit: prefers in person    Reviewed: Term labor symptoms and general obstetric precautions including but not limited to vaginal bleeding, contractions, leaking of fluid and fetal movement were reviewed in detail with the patient.  All questions were answered. Pt has home bp cuff. Check bp weekly, let us know if >140/90.   Follow-up: Return in about 1 week (around 10/30/2022) for LROB visit- as scheduled.  No orders of the defined types were placed in this encounter.   Myna Hidalgo, DO Attending Obstetrician & Gynecologist, [facility] for [employer], [facility]

## 2022-10-24 ENCOUNTER — Telehealth (HOSPITAL_COMMUNITY): Payer: Self-pay | Admitting: *Deleted

## 2022-10-24 ENCOUNTER — Inpatient Hospital Stay (HOSPITAL_COMMUNITY)
Admission: AD | Admit: 2022-10-24 | Discharge: 2022-10-24 | Disposition: A | Discharge status: Home / Self Care | Payer: Managed Care, Other (non HMO) | Attending: Obstetrics & Gynecology | Admitting: Obstetrics & Gynecology

## 2022-10-24 ENCOUNTER — Encounter (HOSPITAL_COMMUNITY): Payer: Self-pay | Admitting: Obstetrics & Gynecology

## 2022-10-24 ENCOUNTER — Encounter (HOSPITAL_COMMUNITY): Payer: Self-pay

## 2022-10-24 DIAGNOSIS — Z3A39 39 weeks gestation of pregnancy: Secondary | ICD-10-CM | POA: Insufficient documentation

## 2022-10-24 DIAGNOSIS — O471 False labor at or after 37 completed weeks of gestation: Secondary | ICD-10-CM | POA: Insufficient documentation

## 2022-10-24 DIAGNOSIS — O479 False labor, unspecified: Secondary | ICD-10-CM | POA: Diagnosis not present

## 2022-10-24 NOTE — Telephone Encounter (Signed)
Preadmission screen  

## 2022-10-24 NOTE — MAU Provider Note (Addendum)
S: Ms. Kelly Watts is a 27 y.o. [id number] at [redacted]w[redacted]d  who presents to MAU today complaining contractions since 0030. She denies vaginal bleeding. She denies LOF. She reports normal fetal movement.    O: BP 112/71   Pulse 88   Temp 97.6 F (36.4 C)   Resp 18   Ht 5\' 3"  (1.6 m)   Wt 68 kg   LMP 01/22/2022   SpO2 98%   BMI 26.57 kg/m  GENERAL: Well-developed, well-nourished female in no acute distress.  HEAD: Normocephalic, atraumatic.  CHEST: Normal effort of breathing, regular heart rate ABDOMEN: Soft, nontender, gravid  Cervical exam:  Dilation: 4 Effacement (%): 60 Cervical Position: Middle Station: -1, -2 Presentation: Vertex Exam by:: Quintella Baton RNC   Fetal Monitoring: 145 bpm/Moderate variability/ 15x15 accels/ None decels CAT: 1 Toco: irregular, every 7-10 minutes  A: SIUP at [redacted]w[redacted]d  False labor No SVE change after several hours  P: Discharged home with strict return precautions Follow-up with primary OB  Myrtie Hawk, DO 10/24/2022 5:44 AM

## 2022-10-24 NOTE — MAU Note (Addendum)
.  Kelly Watts is a 27 y.o. at [redacted]w[redacted]d here in MAU reporting ctxs since 0030. Denies LOF or VB. Reports good FM. Was 1.5 to 2cm yesterday in office  Onset of complaint: 0030 Pain score: 5 Vitals:   10/24/22 0208 10/24/22 0212  BP:  115/75  Pulse: 79   Resp: 18   Temp: 97.6 F (36.4 C)   SpO2: 98%      FHT:145 Lab orders placed from triage:  labor

## 2022-10-26 ENCOUNTER — Inpatient Hospital Stay (HOSPITAL_COMMUNITY): Payer: Managed Care, Other (non HMO) | Admitting: Anesthesiology

## 2022-10-26 ENCOUNTER — Other Ambulatory Visit: Payer: Self-pay

## 2022-10-26 ENCOUNTER — Encounter (HOSPITAL_COMMUNITY): Payer: Self-pay | Admitting: Obstetrics & Gynecology

## 2022-10-26 ENCOUNTER — Inpatient Hospital Stay (HOSPITAL_COMMUNITY)
Admission: AD | Admit: 2022-10-26 | Discharge: 2022-10-27 | DRG: 806 | Disposition: A | Discharge status: Home / Self Care | Payer: Managed Care, Other (non HMO) | Attending: Obstetrics & Gynecology | Admitting: Obstetrics & Gynecology

## 2022-10-26 DIAGNOSIS — O26893 Other specified pregnancy related conditions, third trimester: Secondary | ICD-10-CM | POA: Diagnosis present

## 2022-10-26 DIAGNOSIS — Z880 Allergy status to penicillin: Secondary | ICD-10-CM

## 2022-10-26 DIAGNOSIS — Z8759 Personal history of other complications of pregnancy, childbirth and the puerperium: Secondary | ICD-10-CM

## 2022-10-26 DIAGNOSIS — O471 False labor at or after 37 completed weeks of gestation: Secondary | ICD-10-CM | POA: Diagnosis present

## 2022-10-26 DIAGNOSIS — Z7982 Long term (current) use of aspirin: Secondary | ICD-10-CM

## 2022-10-26 DIAGNOSIS — Z30430 Encounter for insertion of intrauterine contraceptive device: Secondary | ICD-10-CM | POA: Diagnosis not present

## 2022-10-26 DIAGNOSIS — Z6791 Unspecified blood type, Rh negative: Secondary | ICD-10-CM

## 2022-10-26 DIAGNOSIS — O26899 Other specified pregnancy related conditions, unspecified trimester: Secondary | ICD-10-CM

## 2022-10-26 DIAGNOSIS — Z3A39 39 weeks gestation of pregnancy: Secondary | ICD-10-CM

## 2022-10-26 DIAGNOSIS — Z87891 Personal history of nicotine dependence: Secondary | ICD-10-CM

## 2022-10-26 DIAGNOSIS — Z975 Presence of (intrauterine) contraceptive device: Secondary | ICD-10-CM

## 2022-10-26 DIAGNOSIS — O4202 Full-term premature rupture of membranes, onset of labor within 24 hours of rupture: Secondary | ICD-10-CM | POA: Diagnosis not present

## 2022-10-26 DIAGNOSIS — Z3490 Encounter for supervision of normal pregnancy, unspecified, unspecified trimester: Secondary | ICD-10-CM

## 2022-10-26 LAB — CBC
HCT: 29 % — ABNORMAL LOW (ref 36.0–46.0)
Hemoglobin: 9.5 g/dL — ABNORMAL LOW (ref 12.0–15.0)
MCH: 27.2 pg (ref 26.0–34.0)
MCHC: 32.8 g/dL (ref 30.0–36.0)
MCV: 83.1 fL (ref 80.0–100.0)
Platelets: 232 10*3/uL (ref 150–400)
RBC: 3.49 MIL/uL — ABNORMAL LOW (ref 3.87–5.11)
RDW: 14.8 % (ref 11.5–15.5)
WBC: 9.2 10*3/uL (ref 4.0–10.5)
nRBC: 0 % (ref 0.0–0.2)

## 2022-10-26 LAB — POCT FERN TEST: POCT Fern Test: POSITIVE

## 2022-10-26 LAB — TYPE AND SCREEN
ABO/RH(D): O NEG
Antibody Screen: POSITIVE

## 2022-10-26 LAB — RPR: RPR Ser Ql: NONREACTIVE

## 2022-10-26 MED ORDER — ZOLPIDEM TARTRATE 5 MG PO TABS: 5.0000 mg | ORAL_TABLET | Freq: Every evening | ORAL | Status: DC | PRN

## 2022-10-26 MED ORDER — TERBUTALINE SULFATE 1 MG/ML IJ SOLN: 0.2500 mg | Freq: Once | INTRAMUSCULAR | Status: DC | PRN

## 2022-10-26 MED ORDER — FENTANYL CITRATE (PF) 100 MCG/2ML IJ SOLN: 50.0000 ug | INTRAMUSCULAR | Status: DC | PRN

## 2022-10-26 MED ORDER — OXYTOCIN-SODIUM CHLORIDE 30-0.9 UT/500ML-% IV SOLN
1.0000 m[IU]/min | INTRAVENOUS | Status: DC
  Administered 2022-10-26: 2 m[IU]/min via INTRAVENOUS
  Filled 2022-10-26: qty 500

## 2022-10-26 MED ORDER — SIMETHICONE 80 MG PO CHEW: 80.0000 mg | CHEWABLE_TABLET | ORAL | Status: DC | PRN

## 2022-10-26 MED ORDER — LIDOCAINE HCL (PF) 1 % IJ SOLN: 30.0000 mL | INTRAMUSCULAR | Status: DC | PRN

## 2022-10-26 MED ORDER — SENNOSIDES-DOCUSATE SODIUM 8.6-50 MG PO TABS
2.0000 | ORAL_TABLET | ORAL | Status: DC
  Filled 2022-10-26: qty 2

## 2022-10-26 MED ORDER — TETANUS-DIPHTH-ACELL PERTUSSIS 5-2.5-18.5 LF-MCG/0.5 IM SUSY: 0.5000 mL | PREFILLED_SYRINGE | Freq: Once | INTRAMUSCULAR | Status: DC

## 2022-10-26 MED ORDER — ACETAMINOPHEN 325 MG PO TABS
650.0000 mg | ORAL_TABLET | ORAL | Status: DC | PRN
  Administered 2022-10-26: 650 mg via ORAL
  Filled 2022-10-26: qty 2

## 2022-10-26 MED ORDER — LACTATED RINGERS IV SOLN
500.0000 mL | Freq: Once | INTRAVENOUS | Status: AC
  Administered 2022-10-26: 500 mL via INTRAVENOUS

## 2022-10-26 MED ORDER — DIBUCAINE (PERIANAL) 1 % EX OINT: 1.0000 | TOPICAL_OINTMENT | CUTANEOUS | Status: DC | PRN

## 2022-10-26 MED ORDER — OXYCODONE-ACETAMINOPHEN 5-325 MG PO TABS: 2.0000 | ORAL_TABLET | ORAL | Status: DC | PRN

## 2022-10-26 MED ORDER — MEASLES, MUMPS & RUBELLA VAC IJ SOLR: 0.5000 mL | Freq: Once | INTRAMUSCULAR | Status: DC

## 2022-10-26 MED ORDER — LACTATED RINGERS IV SOLN: INTRAVENOUS | Status: DC

## 2022-10-26 MED ORDER — FENTANYL-BUPIVACAINE-NACL 0.5-0.125-0.9 MG/250ML-% EP SOLN
12.0000 mL/h | EPIDURAL | Status: DC | PRN
  Filled 2022-10-26: qty 250

## 2022-10-26 MED ORDER — LIDOCAINE HCL (PF) 1 % IJ SOLN
INTRAMUSCULAR | Status: DC | PRN
  Administered 2022-10-26: 2 mL via EPIDURAL
  Administered 2022-10-26: 10 mL via EPIDURAL

## 2022-10-26 MED ORDER — EPHEDRINE 5 MG/ML INJ: 10.0000 mg | INTRAVENOUS | Status: DC | PRN

## 2022-10-26 MED ORDER — DIPHENHYDRAMINE HCL 50 MG/ML IJ SOLN: 12.5000 mg | INTRAMUSCULAR | Status: DC | PRN

## 2022-10-26 MED ORDER — PHENYLEPHRINE 80 MCG/ML (10ML) SYRINGE FOR IV PUSH (FOR BLOOD PRESSURE SUPPORT)
80.0000 ug | PREFILLED_SYRINGE | INTRAVENOUS | Status: DC | PRN
  Filled 2022-10-26: qty 10

## 2022-10-26 MED ORDER — ONDANSETRON HCL 4 MG/2ML IJ SOLN
4.0000 mg | Freq: Four times a day (QID) | INTRAMUSCULAR | Status: DC | PRN
  Filled 2022-10-26: qty 2

## 2022-10-26 MED ORDER — LACTATED RINGERS IV SOLN: 500.0000 mL | INTRAVENOUS | Status: DC | PRN

## 2022-10-26 MED ORDER — BENZOCAINE-MENTHOL 20-0.5 % EX AERO: 1.0000 | INHALATION_SPRAY | CUTANEOUS | Status: DC | PRN

## 2022-10-26 MED ORDER — SOD CITRATE-CITRIC ACID 500-334 MG/5ML PO SOLN: 30.0000 mL | ORAL | Status: DC | PRN

## 2022-10-26 MED ORDER — OXYCODONE HCL 5 MG PO TABS: 5.0000 mg | ORAL_TABLET | ORAL | Status: DC | PRN

## 2022-10-26 MED ORDER — IBUPROFEN 600 MG PO TABS
600.0000 mg | ORAL_TABLET | Freq: Four times a day (QID) | ORAL | Status: DC
  Administered 2022-10-26 – 2022-10-27 (×4): 600 mg via ORAL
  Filled 2022-10-26 (×4): qty 1

## 2022-10-26 MED ORDER — PARAGARD INTRAUTERINE COPPER IU IUD
1.0000 | INTRAUTERINE_SYSTEM | Freq: Once | INTRAUTERINE | Status: AC
  Administered 2022-10-26: 1 via INTRAUTERINE
  Filled 2022-10-26: qty 1

## 2022-10-26 MED ORDER — ONDANSETRON HCL 4 MG PO TABS: 4.0000 mg | ORAL_TABLET | ORAL | Status: DC | PRN

## 2022-10-26 MED ORDER — OXYCODONE-ACETAMINOPHEN 5-325 MG PO TABS: 1.0000 | ORAL_TABLET | ORAL | Status: DC | PRN

## 2022-10-26 MED ORDER — PHENYLEPHRINE 80 MCG/ML (10ML) SYRINGE FOR IV PUSH (FOR BLOOD PRESSURE SUPPORT): 80.0000 ug | PREFILLED_SYRINGE | INTRAVENOUS | Status: DC | PRN

## 2022-10-26 MED ORDER — PRENATAL MULTIVITAMIN CH
1.0000 | ORAL_TABLET | Freq: Every day | ORAL | Status: DC
  Administered 2022-10-26: 1 via ORAL
  Filled 2022-10-26: qty 1

## 2022-10-26 MED ORDER — ACETAMINOPHEN 325 MG PO TABS: 650.0000 mg | ORAL_TABLET | ORAL | Status: DC | PRN

## 2022-10-26 MED ORDER — FENTANYL-BUPIVACAINE-NACL 0.5-0.125-0.9 MG/250ML-% EP SOLN
EPIDURAL | Status: DC | PRN
  Administered 2022-10-26: 12 mL/h via EPIDURAL

## 2022-10-26 MED ORDER — WITCH HAZEL-GLYCERIN EX PADS: 1.0000 | MEDICATED_PAD | CUTANEOUS | Status: DC | PRN

## 2022-10-26 MED ORDER — OXYTOCIN BOLUS FROM INFUSION
333.0000 mL | Freq: Once | INTRAVENOUS | Status: AC
  Administered 2022-10-26: 333 mL via INTRAVENOUS

## 2022-10-26 MED ORDER — COCONUT OIL OIL: 1.0000 | TOPICAL_OIL | Status: DC | PRN

## 2022-10-26 MED ORDER — ONDANSETRON HCL 4 MG/2ML IJ SOLN: 4.0000 mg | INTRAMUSCULAR | Status: DC | PRN

## 2022-10-26 MED ORDER — DIPHENHYDRAMINE HCL 25 MG PO CAPS: 25.0000 mg | ORAL_CAPSULE | Freq: Four times a day (QID) | ORAL | Status: DC | PRN

## 2022-10-26 MED ORDER — OXYTOCIN-SODIUM CHLORIDE 30-0.9 UT/500ML-% IV SOLN: 2.5000 [IU]/h | INTRAVENOUS | Status: DC

## 2022-10-26 NOTE — Anesthesia Preprocedure Evaluation (Signed)
Anesthesia Evaluation  Patient identified by MRN, date of birth, ID band Patient awake    Reviewed: Allergy & Precautions, Patient's Chart, lab work & pertinent test results  Airway Mallampati: II  TM Distance: >3 FB Neck ROM: Full    Dental no notable dental hx.    Pulmonary asthma , former smoker   Pulmonary exam normal breath sounds clear to auscultation       Cardiovascular negative cardio ROS Normal cardiovascular exam Rhythm:Regular Rate:Normal     Neuro/Psych negative neurological ROS  negative psych ROS   GI/Hepatic negative GI ROS, Neg liver ROS,,,  Endo/Other  negative endocrine ROS    Renal/GU negative Renal ROS  negative genitourinary   Musculoskeletal negative musculoskeletal ROS (+)    Abdominal   Peds negative pediatric ROS (+)  Hematology  (+) Blood dyscrasia, anemia Hb 9.5, plt 232   Anesthesia Other Findings   Reproductive/Obstetrics (+) Pregnancy                             Anesthesia Physical Anesthesia Plan  ASA: 2  Anesthesia Plan: Epidural   Post-op Pain Management:    Induction:   PONV Risk Score and Plan: 2  Airway Management Planned: Natural Airway  Additional Equipment: None  Intra-op Plan:   Post-operative Plan:   Informed Consent: I have reviewed the patients History and Physical, chart, labs and discussed the procedure including the risks, benefits and alternatives for the proposed anesthesia with the patient or authorized representative who has indicated his/her understanding and acceptance.       Plan Discussed with:   Anesthesia Plan Comments:        Anesthesia Quick Evaluation

## 2022-10-26 NOTE — Lactation Note (Signed)
This note was copied from a baby's chart. Lactation Consultation Note  Patient Name: Kelly Watts [id number] Date: 10/26/2022 Age:27 hours Reason for consult: Initial assessment;Term Mom holding baby STS and baby covered in a blanket sleeping. Mom stated she has been latching well. No problems noted. Newborn feeding habits, behavior, STS, I&O, and BF side effects reviewed. Mom encouraged to feed baby 8-12 times/24 hours and with feeding cues.  Mom has no questions or concerns at this time.  Mom stated she would like to maybe pump some here. Mom has a pump at home. Mom shown how to use DEBP & how to disassemble, clean, & reassemble parts. Mom has great everted nipples. Has no soreness at this time. Praised mom. Encouraged to call for assistance if needed. Placing mom on PRN. Mom will call when needs Lactation.  Maternal Data Does the patient have breastfeeding experience prior to this delivery?: Yes How long did the patient breastfeed?: 15 months to her now 27 yr old and her 27 yr old wouldn't latch.  Feeding    LATCH Score       Type of Nipple: Everted at rest and after stimulation  Comfort (Breast/Nipple): Soft / non-tender  Hold (Positioning): No assistance needed to correctly position infant at breast.      Lactation Tools Discussed/Used Tools: Pump Breast pump type: Double-Electric Breast Pump Pump Education: Setup, frequency, and cleaning;Milk Storage Reason for Pumping: mom wanted to pump for extra stimulation Pumping frequency: PRN  Interventions Interventions: Breast feeding basics reviewed;DEBP;Skin to skin;LC Services brochure  Discharge    Consult Status Consult Status: PRN Date: 10/27/22 Follow-up type: In-patient    Charyl Dancer 10/26/2022, 9:11 PM

## 2022-10-26 NOTE — Procedures (Signed)
POST-PLACENTAL IUD INSERTION PROCEDURE NOTE Patient name: Kelly Watts MRN [id number]  Date of birth: Apr 06, 1996  The risks and benefits of the method and placement have been thouroughly reviewed with the patient and all questions were answered.  Specifically the patient is aware of failure rate of 05/998, expulsion of the IUD and of possible perforation.  The patient is aware of irregular bleeding due to the method and understands the incidence of irregular bleeding diminishes with time.   Signed copy of informed consent in chart.   Vaginal, labial and perineal areas thoroughly inspected for lacerations. Lacerations: none laceration identified and if needed was repaired prior to IUD insertion  Pt's IUD choice: Paragard  Time out was performed.    IUD removed from insertion device and grasped between sterile gloved fingers. Fundus identified through abdominal wall using non-insertion hand. IUD inserted to fundus with bimanual technique. IUD carefully released at the fundus and insertion hand gently removed from vagina.    No need for strings to be trimmed. Patient tolerated procedure well.  Patient given post procedure instructions and IUD care card with expiration date. Patient advised to abstain from sexual intercourse and pulling on strings before her follow-up visit. Patient verbalized an understanding of the plan of care and agrees.   Charges entered.  Arabella Merles CNM 10/26/2022 8:08 AM

## 2022-10-26 NOTE — Anesthesia Procedure Notes (Signed)
Epidural Patient location during procedure: OB Start time: 10/26/2022 3:53 AM End time: 10/26/2022 4:03 AM  Staffing Anesthesiologist: Lannie Fields, DO Performed: anesthesiologist   Preanesthetic Checklist Completed: patient identified, IV checked, risks and benefits discussed, monitors and equipment checked, pre-op evaluation and timeout performed  Epidural Patient position: sitting Prep: DuraPrep and site prepped and draped Patient monitoring: continuous pulse ox, blood pressure, heart rate and cardiac monitor Approach: midline Location: L3-L4 Injection technique: LOR air  Needle:  Needle type: Tuohy  Needle gauge: 17 G Needle length: 9 cm Needle insertion depth: 4 cm Catheter type: closed end flexible Catheter size: 19 Gauge Catheter at skin depth: 9 cm Test dose: negative  Assessment Sensory level: T8 Events: blood not aspirated, no cerebrospinal fluid, injection not painful, no injection resistance, no paresthesia and negative IV test  Additional Notes Patient identified. Risks/Benefits/Options discussed with patient including but not limited to bleeding, infection, nerve damage, paralysis, failed block, incomplete pain control, headache, blood pressure changes, nausea, vomiting, reactions to medication both or allergic, itching and postpartum back pain. Confirmed with bedside nurse the patient's most recent platelet count. Confirmed with patient that they are not currently taking any anticoagulation, have any bleeding history or any family history of bleeding disorders. Patient expressed understanding and wished to proceed. All questions were answered. Sterile technique was used throughout the entire procedure. Please see nursing notes for vital signs. Test dose was given through epidural catheter and negative prior to continuing to dose epidural or start infusion. Warning signs of high block given to the patient including shortness of breath, tingling/numbness in  hands, complete motor block, or any concerning symptoms with instructions to call for help. Patient was given instructions on fall risk and not to get out of bed. All questions and concerns addressed with instructions to call with any issues or inadequate analgesia.  Reason for block:procedure for pain

## 2022-10-26 NOTE — Discharge Summary (Signed)
Postpartum Discharge Summary  Date of Service updated***     Patient Name: Kelly Watts DOB: 1995/11/04 MRN: [id number]  Date of admission: 10/26/2022 Delivery date:10/26/2022  Delivering provider: Cam Hai D  Date of discharge: 10/26/2022  Admitting diagnosis: Intrauterine normal pregnancy [Z34.90] Intrauterine pregnancy: [redacted]w[redacted]d     Secondary diagnosis:  Principal Problem:   Intrauterine normal pregnancy Active Problems:   Rh negative state in antepartum period   History of miscarriage   Encounter for supervision of normal pregnancy, antepartum   History of preterm premature rupture of membranes (PPROM)   Contraception, device intrauterine  Additional problems: none    Discharge diagnosis: Term Pregnancy Delivered                                              Post partum procedures:rhogam and pp IUD placement  *** Augmentation: Pitocin Complications: None  Hospital course: Onset of Labor With Vaginal Delivery      27 y.o. yo [id number] at [redacted]w[redacted]d was admitted in Active Labor on 10/26/2022. Labor course was uncomplicated. Postplacental IUD placed without difficulty. Membrane Rupture Time/Date: 12:10 AM ,10/26/2022   Delivery Method:Vaginal, Spontaneous  Episiotomy: None  Lacerations:  None  Patient had a postpartum course complicated by ***.  She is ambulating, tolerating a regular diet, passing flatus, and urinating well. Patient is discharged home in stable condition on 10/26/22.  Newborn Data: Birth date:10/26/2022  Birth time:7:22 AM  Gender:Female  Living status:Living  Apgars:9 ,9  Weight:3005 g (6lb 10oz)  Magnesium Sulfate received: No BMZ received: No Rhophylac:{Rhophylac received:30440032} MMR:N/A T-DaP:Given prenatally Flu: No Transfusion:No  Physical exam  Vitals:   10/26/22 0802 10/26/22 0817 10/26/22 0831 10/26/22 0856  BP: 100/74 112/64 108/72 112/74  Pulse: 65 69 (!) 58 (!) 59  Resp: 18 18 18 18   Temp:   (!) 97.5 F (36.4 C) 98 F (36.7  C)  TempSrc:   Oral Oral  SpO2:    98%  Weight:      Height:       General: {Exam; general:21111117} Lochia: {Desc; appropriate/inappropriate:30686::"appropriate"} Uterine Fundus: {Desc; firm/soft:30687} Incision: {Exam; incision:21111123} DVT Evaluation: {Exam; dvt:2111122} Labs: Lab Results  Component Value Date   WBC 9.2 10/26/2022   HGB 9.5 (L) 10/26/2022   HCT 29.0 (L) 10/26/2022   MCV 83.1 10/26/2022   PLT 232 10/26/2022      Latest Ref Rng & Units 10/29/2021   11:52 AM  CMP  Glucose 70 - 99 mg/dL 90   BUN 6 - 20 mg/dL 13   Creatinine 9.56 - 1.00 mg/dL 2.13   Sodium 086 - 578 mmol/L 138   Potassium 3.5 - 5.2 mmol/L 4.5   Chloride 96 - 106 mmol/L 100   CO2 20 - 29 mmol/L 24   Calcium 8.7 - 10.2 mg/dL 9.5   Total Protein 6.0 - 8.5 g/dL 7.3   Total Bilirubin 0.0 - 1.2 mg/dL 0.4   Alkaline Phos 44 - 121 IU/L 113   AST 0 - 40 IU/L 16   ALT 0 - 32 IU/L 18    Edinburgh Score:    04/11/2020    2:14 PM  Edinburgh Postnatal Depression Scale Screening Tool  I have been able to laugh and see the funny side of things. 0  I have looked forward with enjoyment to things. 0  I have blamed myself unnecessarily when  things went wrong. 1  I have been anxious or worried for no good reason. 1  I have felt scared or panicky for no good reason. 0  Things have been getting on top of me. 0  I have been so unhappy that I have had difficulty sleeping. 0  I have felt sad or miserable. 0  I have been so unhappy that I have been crying. 0  The thought of harming myself has occurred to me. 0  Edinburgh Postnatal Depression Scale Total 2     After visit meds:  Allergies as of 10/26/2022       Reactions   Penicillins Hives   Has patient had a PCN reaction causing immediate rash, facial/tongue/throat swelling, SOB or lightheadedness with hypotension: Yes Has patient had a PCN reaction causing severe rash involving mucus membranes or skin necrosis: No Has patient had a PCN reaction  that required hospitalization: No Has patient had a PCN reaction occurring within the last 10 years: No If all of the above answers are "NO", then may proceed with Cephalosporin use.   Sulfa Antibiotics Hives     Med Rec must be completed prior to using this [facility]***        Discharge home in stable condition Infant Feeding: {Baby feeding:23562} Infant Disposition:{CHL IP OB HOME WITH ZOXWRU:04540} Discharge instruction: per After Visit Summary and Postpartum booklet. Activity: Advance as tolerated. Pelvic rest for 6 weeks.  Diet: routine diet Future Appointments: Future Appointments  Date Time Provider Department Center  10/30/2022 11:30 AM Jacklyn Shell, CNM [facility]  11/07/2022  6:45 AM [facility] SCHED ROOM [facility] None  11/17/2022  1:00 PM Jerilee Field, MD [facility] None   Follow up Visit:  Arabella Merles, CNM  Myrle Sheng R Please schedule this patient for Postpartum visit in: 6 weeks with the following provider: Any provider In-Person For C/S patients schedule nurse incision check in weeks 2 weeks: no Low risk pregnancy complicated by: none Delivery mode:  SVD Anticipated Birth Control:  PP IUD Placed PP Procedures needed: verify IUD placement Schedule Integrated BH visit: no   10/26/2022 Arabella Merles, CNM

## 2022-10-26 NOTE — Anesthesia Postprocedure Evaluation (Signed)
Anesthesia Post Note  Patient: Kelly Watts  Procedure(s) Performed: AN AD HOC LABOR EPIDURAL     Patient location during evaluation: Mother Baby Anesthesia Type: Epidural Level of consciousness: awake, awake and alert and oriented Pain management: pain level controlled Vital Signs Assessment: post-procedure vital signs reviewed and stable Respiratory status: spontaneous breathing, nonlabored ventilation and respiratory function stable Cardiovascular status: blood pressure returned to baseline and stable Postop Assessment: no headache, no backache, epidural receding, patient able to bend at knees, no apparent nausea or vomiting, able to ambulate and adequate PO intake Anesthetic complications: no   No notable events documented.  Last Vitals:  Vitals:   10/26/22 1321 10/26/22 1758  BP: 106/64 112/64  Pulse: 71 74  Resp: 18 18  Temp: 36.4 C 36.6 C  SpO2: 99% 97%    Last Pain:  Vitals:   10/26/22 1758  TempSrc: Oral  PainSc: 0-No pain   Pain Goal:                   [name]

## 2022-10-26 NOTE — H&P (Signed)
OBSTETRIC ADMISSION HISTORY AND PHYSICAL  Kelly Watts is a 27 y.o. female [phone] with IUP at [redacted]w[redacted]d by LMP presenting for SOL. She reports +FMs, No LOF, no VB, no blurry vision, headaches or peripheral edema, and RUQ pain.  She plans on breast feeding. She request post placental paragaurd for birth control. She received her [facility]   Dating: By LMP --->  Estimated Date of Delivery: 10/29/22  Sono:    @[redacted]w[redacted]d , CWD, normal anatomy, cephalic presentation, anterior lie, 2716 g, 37% EFW   Prenatal History/Complications: None  Past Medical History: Past Medical History:  Diagnosis Date   Angio-edema    Asthma    Eczema    Encounter for Nexplanon removal 11/16/2015   History of kidney stones    History of kidney stones    Kidney infection    Miscarriage    Nexplanon insertion 11/02/2012   Inserted left arm 11/02/12   Patient underweight    Recurrent upper respiratory infection (URI)    Urticaria     Past Surgical History: Past Surgical History:  Procedure Laterality Date   DILATION AND CURETTAGE OF UTERUS N/A 08/06/2021   Procedure: SUCTION DILATATION AND CURETTAGE;  Surgeon: Myna Hidalgo, DO;  Location: [facility];  Service: Gynecology;  Laterality: N/A;  pt knows to be NPO, will arrive at 10:00 - no PAT   EAR TUBE REMOVAL     EXTRACORPOREAL SHOCK WAVE LITHOTRIPSY Left 10/30/2016   Procedure: LEFT EXTRACORPOREAL SHOCK WAVE LITHOTRIPSY (ESWL);  Surgeon: Crist Fat, MD;  Location: [facility];  Service: Urology;  Laterality: Left;   TYMPANOSTOMY TUBE PLACEMENT     WISDOM TOOTH EXTRACTION      Obstetrical History: OB History     Gravida  6   Para  3   Term  2   Preterm  1   AB  2   Living  2      SAB  2   IAB      Ectopic      Multiple  0   Live Births  2           Social History Social History   Socioeconomic History   Marital status: Married    Spouse name: Tashunda Vandezande   Number of children: 1   Years of education: Not  on file   Highest education level: Not on file  Occupational History   Not on file  Tobacco Use   Smoking status: Former    Packs/day: 0.25    Years: 3.00    Additional pack years: 0.00    Total pack years: 0.75    Types: Cigarettes    Quit date: 01/30/2016    Years since quitting: 6.7   Smokeless tobacco: Never  Vaping Use   Vaping Use: Never used  Substance and Sexual Activity   Alcohol use: Not Currently    Comment: occ   Drug use: No   Sexual activity: Yes    Birth control/protection: None  Other Topics Concern   Not on file  Social History Narrative   Not on file   Social Determinants of Health   Financial Resource Strain: Low Risk  (10/29/2021)   Overall Financial Resource Strain (CARDIA)    Difficulty of Paying Living Expenses: Not hard at all  Food Insecurity: No Food Insecurity (10/29/2021)   Hunger Vital Sign    Worried About Running Out of Food in the Last Year: Never true    Ran Out of  Food in the Last Year: Never true  Transportation Needs: No Transportation Needs (10/29/2021)   PRAPARE - Administrator, Civil Service (Medical): No    Lack of Transportation (Non-Medical): No  Physical Activity: Sufficiently Active (10/29/2021)   Exercise Vital Sign    Days of Exercise per Week: 7 days    Minutes of Exercise per Session: 30 min  Stress: No Stress Concern Present (10/29/2021)   [employer] of Occupational Health - Occupational Stress Questionnaire    Feeling of Stress : Not at all  Social Connections: Moderately Integrated (10/29/2021)   Social Connection and Isolation Panel [NHANES]    Frequency of Communication with Friends and Family: More than three times a week    Frequency of Social Gatherings with Friends and Family: More than three times a week    Attends Religious Services: 1 to 4 times per year    Active Member of [employer] or Organizations: No    Attends Engineer, structural: Never    Marital Status: Married    Family  History: Family History  Problem Relation Age of Onset   Allergic rhinitis Father    Asthma Mother    Allergic rhinitis Mother    Coronary artery disease Paternal Grandfather    Cancer Paternal Grandmother        breast   Diabetes Maternal Grandmother    Thyroid disease Maternal Grandmother    Heart disease Maternal Grandmother    Melanoma Maternal Grandfather    Allergic rhinitis Brother    Allergic rhinitis Brother    Angioedema Neg Hx    Atopy Neg Hx    Eczema Neg Hx    Immunodeficiency Neg Hx    Urticaria Neg Hx     Allergies: Allergies  Allergen Reactions   Penicillins Hives    Has patient had a PCN reaction causing immediate rash, facial/tongue/throat swelling, SOB or lightheadedness with hypotension: Yes Has patient had a PCN reaction causing severe rash involving mucus membranes or skin necrosis: No Has patient had a PCN reaction that required hospitalization: No Has patient had a PCN reaction occurring within the last 10 years: No If all of the above answers are "NO", then may proceed with Cephalosporin use.    Sulfa Antibiotics Hives    Medications Prior to Admission  Medication Sig Dispense Refill Last Dose   aspirin EC 81 MG tablet Take 2 tablets (162 mg total) by mouth daily. 60 tablet 6    Prenatal Vit-Fe Fumarate-FA (PRENATAL VITAMIN PO) Take by mouth.      tamsulosin (FLOMAX) 0.4 MG CAPS capsule Take 1 capsule (0.4 mg total) by mouth daily. (Patient not taking: Reported on 10/02/2022) 30 capsule 0      Review of Systems   All systems reviewed and negative except as stated in HPI  Blood pressure 124/75, pulse 85, temperature 97.8 F (36.6 C), resp. rate 18, height 5\' 3"  (1.6 m), weight 68.5 kg, last menstrual period 01/22/2022. General appearance: alert, cooperative, and appears stated age Lungs: clear to auscultation bilaterally Heart: regular rate  Abdomen: soft, non-tender; bowel sounds normal Presentation: cephalic Fetal monitoringBaseline: 145  bpm, Variability: Good {> 6 bpm), Accelerations: Reactive, and Decelerations: Absent Uterine activityFrequency: Every 2-7 minutes Dilation: 5.5 Effacement (%): 80 Station: -1, -2 Exam by:: K.Wilson,RN   Prenatal labs: ABO, Rh: O/Negative/-- (12/14 1525) Antibody: Negative (03/26 0853) Rubella: 2.30 (12/14 1525) RPR: Non Reactive (03/26 0853)  HBsAg: Negative (12/14 1525)  HIV: Non Reactive (03/26 0853)  GBS: --/  Negative (05/30 1400)  1 hr Glucola Neg Genetic screening  LR female Anatomy US Normal  Prenatal Transfer Tool  Maternal Diabetes: No Genetic Screening: Normal Maternal Ultrasounds/Referrals: Normal Fetal Ultrasounds or other Referrals:  None Maternal Substance Abuse:  No Significant Maternal Medications:  None Significant Maternal Lab Results:  Group B Strep negative and Rh negative Number of Prenatal Visits:greater than 3 verified prenatal visits Other Comments:  None  No results found for this or any previous visit (from the past 24 hour(s)).  Patient Active Problem List   Diagnosis Date Noted   Flank pain in pregnant patient 06/09/2022   History of preterm premature rupture of membranes (PPROM) 05/15/2022   Asthma 04/17/2022   Encounter for supervision of normal pregnancy, antepartum 04/16/2022   Breast disorder 08/29/2021   History of miscarriage 08/29/2021   Nephrolithiasis 10/13/2016   Kidney cyst, acquired 10/13/2016   Rh negative state in antepartum period 07/21/2012    Assessment/Plan:  AMINA MENCHACA is a 27 y.o. [id number] at [redacted]w[redacted]d here for SOL.  #Labor:Progressing spontaneously, continue to monitor. #Pain: Per patient request #FWB: Cat 1 #ID:  GBS neg #MOF: Breast #MOC:paraguard, consented #Rh neg: rhogam eval after delivery   Celedonio Savage, MD  10/26/2022, 2:21 AM

## 2022-10-26 NOTE — MAU Note (Signed)
.  Kelly Watts is a 27 y.o. at [redacted]w[redacted]d here in MAU reporting: water broke at 0010. Clear fluid out. Wearing a pad now. Ctx q 4-5 min . 4-5cm on lat check. Good fetal movment reported.   Onset of complaint: 0010 Pain score: 6-7 Vitals:   10/26/22 0127  BP: 124/75  Pulse: 85  Resp: 18  Temp: 97.8 F (36.6 C)     FHT:145 Lab orders placed from triage:  labor eval

## 2022-10-27 MED ORDER — ACETAMINOPHEN 325 MG PO TABS: 650.0000 mg | ORAL_TABLET | ORAL | 0 refills | Status: DC | PRN

## 2022-10-27 MED ORDER — IBUPROFEN 600 MG PO TABS: 600.0000 mg | ORAL_TABLET | Freq: Four times a day (QID) | ORAL | 0 refills | Status: DC

## 2022-10-30 ENCOUNTER — Encounter: Payer: Managed Care, Other (non HMO) | Admitting: Advanced Practice Midwife

## 2022-11-07 ENCOUNTER — Inpatient Hospital Stay (HOSPITAL_COMMUNITY)
Admission: RE | Admit: 2022-11-07 | Payer: Managed Care, Other (non HMO) | Source: Home / Self Care | Admitting: Family Medicine

## 2022-11-07 ENCOUNTER — Inpatient Hospital Stay (HOSPITAL_COMMUNITY): Payer: Managed Care, Other (non HMO)

## 2022-11-17 ENCOUNTER — Ambulatory Visit (INDEPENDENT_AMBULATORY_CARE_PROVIDER_SITE_OTHER): Payer: Managed Care, Other (non HMO) | Admitting: Urology

## 2022-11-17 ENCOUNTER — Encounter: Payer: Self-pay | Admitting: Urology

## 2022-11-17 VITALS — BP 113/73 | HR 76

## 2022-11-17 DIAGNOSIS — N1339 Other hydronephrosis: Secondary | ICD-10-CM

## 2022-11-17 DIAGNOSIS — N200 Calculus of kidney: Secondary | ICD-10-CM

## 2022-11-17 DIAGNOSIS — R3129 Other microscopic hematuria: Secondary | ICD-10-CM | POA: Diagnosis not present

## 2022-11-17 LAB — URINALYSIS, ROUTINE W REFLEX MICROSCOPIC
Bilirubin, UA: NEGATIVE
Glucose, UA: NEGATIVE
Ketones, UA: NEGATIVE
Nitrite, UA: NEGATIVE
Protein,UA: NEGATIVE
Specific Gravity, UA: 1.02 (ref 1.005–1.030)
Urobilinogen, Ur: 0.2 mg/dL (ref 0.2–1.0)
pH, UA: 5.5 (ref 5.0–7.5)

## 2022-11-17 LAB — MICROSCOPIC EXAMINATION: RBC, Urine: >30 /hpf — AB (ref 0–2)

## 2022-11-17 NOTE — Progress Notes (Unsigned)
11/17/2022 1:17 PM   Kelly Watts 10-20-1995 [id number]  Referring provider: Aviva Signs, CNM [address] [place],  [place] [address]  No chief complaint on file.   HPI: New patient-  1) right hydronephrosis-right hydronephrosis was noted on ultrasound Sep 24, 2022.  She was pregnant and gave birth on Jun 2024. Right flank resolved.   2) kidney stones-on the renal ultrasound there was a 7 mm left renal Kelly Watts.  She did have a CT scan in 2019 and there were 2 left stones in the left lower pole these appeared at an area of possible scarring and outside of the collecting system.  She has passed a [place] and did have ESWL in 2019. He did have reflux as a child.   3) microscopic hematuria-there is greater than 30 red blood cells per high-powered field on her urinalysis today. No gross hematuria. Still with some post partum vaginal bleeding.    Today, seen for the above.   PMH: Past Medical History:  Diagnosis Date   Angio-edema    Asthma    Eczema    Encounter for Nexplanon removal 11/16/2015   History of kidney stones    History of kidney stones    Kidney infection    Miscarriage    Nexplanon insertion 11/02/2012   Inserted left arm 11/02/12   Patient underweight    Recurrent upper respiratory infection (URI)    Urticaria     Surgical History: Past Surgical History:  Procedure Laterality Date   DILATION AND CURETTAGE OF UTERUS N/A 08/06/2021   Procedure: SUCTION DILATATION AND CURETTAGE;  Surgeon: Myna Hidalgo, DO;  Location: [facility];  Service: Gynecology;  Laterality: N/A;  pt knows to be NPO, will arrive at 10:00 - no PAT   EAR TUBE REMOVAL     EXTRACORPOREAL SHOCK WAVE LITHOTRIPSY Left 10/30/2016   Procedure: LEFT EXTRACORPOREAL SHOCK WAVE LITHOTRIPSY (ESWL);  Surgeon: Crist Fat, MD;  Location: [facility];  Service: Urology;  Laterality: Left;   TYMPANOSTOMY TUBE PLACEMENT     WISDOM TOOTH EXTRACTION      Home Medications:   Allergies as of 11/17/2022       Reactions   Penicillins Hives   Sulfa Antibiotics Hives        Medication List        Accurate as of November 17, 2022  1:17 PM. If you have any questions, ask your nurse or doctor.          acetaminophen 325 MG tablet Commonly known as: Tylenol Take 2 tablets (650 mg total) by mouth every 4 (four) hours as needed (for pain scale < 4).   ibuprofen 600 MG tablet Commonly known as: ADVIL Take 1 tablet (600 mg total) by mouth every 6 (six) hours.   PRENATAL VITAMIN PO Take 1 tablet by mouth daily.   tamsulosin 0.4 MG Caps capsule Commonly known as: FLOMAX Take 1 capsule (0.4 mg total) by mouth daily.        Allergies:  Allergies  Allergen Reactions   Penicillins Hives   Sulfa Antibiotics Hives    Family History: Family History  Problem Relation Age of Onset   Allergic rhinitis Father    Asthma Mother    Allergic rhinitis Mother    Coronary artery disease Paternal Grandfather    Cancer Paternal Grandmother        breast   Diabetes Maternal Grandmother    Thyroid disease Maternal Grandmother    Heart disease Maternal  Grandmother    Melanoma Maternal Grandfather    Allergic rhinitis Brother    Allergic rhinitis Brother    Angioedema Neg Hx    Atopy Neg Hx    Eczema Neg Hx    Immunodeficiency Neg Hx    Urticaria Neg Hx     Social History:  reports that she quit smoking about 6 years ago. Her smoking use included cigarettes. She started smoking about 9 years ago. She has a 0.8 pack-year smoking history. She has never used smokeless tobacco. She reports that she does not currently use alcohol. She reports that she does not use drugs.   Physical Exam: BP 113/73   Pulse 76   Constitutional:  Alert and oriented, No acute distress. HEENT: [place] AT, moist mucus membranes.  Trachea midline, no masses. Cardiovascular: No clubbing, cyanosis, or edema. Respiratory: Normal respiratory effort, no increased work of breathing. GI:  Abdomen is soft, nontender, nondistended, no abdominal masses GU: No CVA tenderness Skin: No rashes, bruises or suspicious lesions. Neurologic: Grossly intact, no focal deficits, moving all 4 extremities. Psychiatric: Normal mood and affect.  Laboratory Data: Lab Results  Component Value Date   WBC 9.2 10/26/2022   HGB 9.5 (L) 10/26/2022   HCT 29.0 (L) 10/26/2022   MCV 83.1 10/26/2022   PLT 232 10/26/2022    Lab Results  Component Value Date   CREATININE 0.74 10/29/2021    No results found for: "PSA"  No results found for: "TESTOSTERONE"  No results found for: "HGBA1C"  Urinalysis    Component Value Date/Time   COLORURINE STRAW (A) 09/24/2022 2011   APPEARANCEUR CLEAR 09/24/2022 2011   APPEARANCEUR Cloudy (A) 01/20/2017 1225   LABSPEC 1.002 (L) 09/24/2022 2011   PHURINE 7.0 09/24/2022 2011   GLUCOSEU NEGATIVE 09/24/2022 2011   HGBUR MODERATE (A) 09/24/2022 2011   HGBUR negative 09/01/2007 0803   BILIRUBINUR NEGATIVE 09/24/2022 2011   BILIRUBINUR Negative 01/20/2017 1225   KETONESUR NEGATIVE 09/24/2022 2011   PROTEINUR NEGATIVE 09/24/2022 2011   UROBILINOGEN 0.2 09/30/2016 1803   NITRITE NEGATIVE 09/24/2022 2011   LEUKOCYTESUR NEGATIVE 09/24/2022 2011    Lab Results  Component Value Date   LABMICR See below: 01/20/2017   WBCUA 0-5 01/20/2017   RBCUA 0-2 01/20/2017   LABEPIT >10 (A) 01/20/2017   MUCUS Present 01/20/2017   BACTERIA RARE (A) 09/24/2022    Pertinent Imaging:  Results for orders placed during the hospital encounter of 10/30/16  DG Abd 1 View  Narrative CLINICAL DATA:  Left renal [place].  EXAM: ABDOMEN - 1 VIEW  COMPARISON:  10/14/2016 .  FINDINGS: Soft tissue structures are unremarkable. No bowel distention. Nonspecific air-filled loops small bowel noted. Stool in the colon. Faint calcific densities again noted over the left kidney suggesting left nephrolithiasis. No evidence of urolithiasis. No acute bony abnormality identified.  Mild lumbar scoliosis concave left.  IMPRESSION: Faint calcific densities again noted over the left kidney suggesting left nephrolithiasis. No evidence urolithiasis. No acute abnormality identified.   Electronically Signed By: Maisie Fus  Register On: 10/30/2016 11:38   Results for orders placed during the hospital encounter of 09/24/22  US RENAL  Narrative CLINICAL DATA:  Right-sided pain for 2 days  EXAM: RENAL / URINARY TRACT ULTRASOUND COMPLETE  COMPARISON:  Renal ultrasound 06/18/2022 and CT abdomen and pelvis 09/21/2017  FINDINGS: Right Kidney:  Renal measurements: 12.4 x 6.3 x 5.5 cm = volume: 226 mL. Echogenicity within normal limits. Moderate hydronephrosis. This is new since ultrasound 06/18/2022.  Left Kidney:  Renal measurements: 10.6 x 5.6 x 5.3 cm = volume: 164 mL. Echogenicity within normal limits. Shadowing 7 mm [place] similar to 09/21/2017. Mild hydronephrosis, similar to decreased from 06/18/2022.  Bladder:  Appears normal for degree of bladder distention.  Other:  None.  IMPRESSION: 1. Moderate right hydronephrosis, new from ultrasound 06/18/2022. 2. Mild left hydronephrosis, similar to decreased from 06/18/2022. 3. 7 mm left renal [place], unchanged from CT 09/21/2017.   Electronically Signed By: Minerva Fester M.D. On: 09/24/2022 22:16  No valid procedures specified. No results found for this or any previous visit.  No results found for this or any previous visit.   Assessment & Plan:    1. Renal Seeling She needs a KUB.  We discussed management of stones including surveillance, shockwave lithotripsy or ureteroscopy.  If the left Kindle or Ko[place] is in the left lower pole it might be parenchymal and something we can monitor if it has been stable for 5 years. - Urinalysis, Routine w reflex microscopic  2.  Hydronephrosis-likely related to pregnancy.  Will repeat renal ultrasound.  3.  Microscopic hematuria-likely post partum vaginal  bleeding - will follow   No follow-ups on file.  Jerilee Field, MD  [facility]  [address] [place], [place] [address] [phone]

## 2022-11-18 ENCOUNTER — Telehealth (HOSPITAL_COMMUNITY): Payer: Self-pay | Admitting: *Deleted

## 2022-11-18 NOTE — Telephone Encounter (Signed)
Attempted hospital discharge follow-up call. Left message for patient to return RN call with any questions or concerns. Deforest Hoyles, RN, 11/18/22, [phone]

## 2022-11-19 ENCOUNTER — Encounter: Payer: Self-pay | Admitting: Obstetrics & Gynecology

## 2022-12-10 ENCOUNTER — Ambulatory Visit: Payer: Managed Care, Other (non HMO) | Admitting: Advanced Practice Midwife

## 2022-12-10 ENCOUNTER — Encounter: Payer: Self-pay | Admitting: Advanced Practice Midwife

## 2022-12-10 DIAGNOSIS — Z975 Presence of (intrauterine) contraceptive device: Secondary | ICD-10-CM

## 2022-12-10 NOTE — Patient Instructions (Signed)
Use the website www.postpartum.net for postpartum resources; use the website Lact Med to determine if medications are safe with breastfeeding.

## 2022-12-10 NOTE — Progress Notes (Signed)
POSTPARTUM VISIT Patient name: Kelly Watts MRN [id number]  Date of birth: 01-09-1996 Chief Complaint:   Postpartum Care  History of Present Illness:   Kelly Watts is a 27 y.o. [id number] Caucasian female being seen today for a postpartum visit. She is 6 weeks postpartum following a spontaneous vaginal delivery at 39.4 gestational weeks. IOL: no    Anesthesia: epidural.  Laceration: none.  Complications: none. Inpatient contraception: yes Paragard placement postplacentally .   Pregnancy complicated by Rh neg; hx renal calculi . Tobacco use: former . Substance use disorder: no. Last pap smear: June 2022 and results were NILM w/ HRHPV negative. Next pap smear due: June 2025 Patient's last menstrual period was 01/22/2022.  Postpartum course has been uncomplicated. Bleeding none. Bowel function is normal. Bladder function is normal. Urinary incontinence? no, fecal incontinence? no Patient is sexually active. Last sexual activity:  since delivery . Desired contraception: Paragard placed postplacental in hospital'   . Patient does not know about a pregnancy in the future.  Desired family size is unsure number of children.   Upstream - 12/10/22 1343       Pregnancy Intention Screening   Does the patient want to become pregnant in the next year? No    Does the patient's partner want to become pregnant in the next year? No    Would the patient like to discuss contraceptive options today? Yes      Contraception Wrap Up   Current Method IUD or IUS    End Method IUD or IUS    Contraception Counseling Provided Yes            The pregnancy intention screening data noted above was reviewed. Potential methods of contraception were discussed. The patient elected to proceed with IUD or IUS.  Edinburgh Postpartum Depression Screening: negative  Edinburgh Postnatal Depression Scale - 12/10/22 1344       Edinburgh Postnatal Depression Scale:  In the Past 7 Days   I have been able to laugh  and see the funny side of things. 0    I have looked forward with enjoyment to things. 0    I have blamed myself unnecessarily when things went wrong. 0    I have been anxious or worried for no good reason. 0    I have felt scared or panicky for no good reason. 0    Things have been getting on top of me. 0    I have been so unhappy that I have had difficulty sleeping. 0    I have felt sad or miserable. 0    I have been so unhappy that I have been crying. 0    The thought of harming myself has occurred to me. 0    Edinburgh Postnatal Depression Scale Total 0                04/17/2022    2:39 PM 10/29/2021   10:55 AM 12/05/2019    9:16 AM 08/24/2019    3:37 PM  GAD 7 : Generalized Anxiety Score  Nervous, Anxious, on Edge 0 0 0 0  Control/stop worrying 0 0 0 0  Worry too much - different things 0 0 0 0  Trouble relaxing 0 0 0 0  Restless 0 0 0 0  Easily annoyed or irritable 0 0 0 0  Afraid - awful might happen 0 0 0 0  Total GAD 7 Score 0 0 0 0  Anxiety Difficulty    Not  difficult at all     Baby's course has been uncomplicated. Baby is feeding by breast: milk supply adequate. Infant has a pediatrician/family doctor? Yes.  Childcare strategy if returning to work/school: n/a-stay at home mom.  Pt has material needs met for her and baby: Yes.   Review of Systems:   Pertinent items are noted in HPI Denies Abnormal vaginal discharge w/ itching/odor/irritation, headaches, visual changes, shortness of breath, chest pain, abdominal pain, severe nausea/vomiting, or problems with urination or bowel movements. Pertinent History Reviewed:  Reviewed past medical,surgical, obstetrical and family history.  Reviewed problem list, medications and allergies. OB History  Gravida Para Term Preterm AB Living  6 4 3 1 2 3   SAB IAB Ectopic Multiple Live Births  2     0 3    # Outcome Date GA Lbr Len/2nd Weight Sex Type Anes PTL Lv  6 Term 10/26/22 [redacted]w[redacted]d / 00:23 6 lb 10 oz (3.005 kg) F Vag-Spont  EPI  LIV  5 SAB 07/2021          4 Term 03/07/20 [redacted]w[redacted]d 03:50 / 00:06 5 lb 14.9 oz (2.69 kg) F Vag-Spont EPI N LIV  3 SAB 2020          2 Term 08/11/17 [redacted]w[redacted]d 15:19 / 01:00 7 lb 12.5 oz (3.53 kg) F Vag-Spont EPI N LIV  1 Preterm 07/25/12 [redacted]w[redacted]d 07:35 / 00:04 15 oz (0.425 kg) F Vag-Spont None  FD   Physical Assessment:   Vitals:   12/10/22 1340  BP: 111/69  Pulse: (!) 103  Weight: 135 lb (61.2 kg)  Height: 5\' 3"  (1.6 m)  Body mass index is 23.91 kg/m.       Physical Examination:   General appearance: alert, well appearing, and in no distress  Mental status: alert, oriented to person, place, and time  Skin: warm & dry   Cardiovascular: normal heart rate noted   Respiratory: normal respiratory effort, no distress   Breasts: deferred, no complaints   Abdomen: soft, non-tender   Pelvic: normal external genitalia, vulva, vagina, cervix, uterus and adnexa; IUD string approx 3.5cm from os (not trimmed; husband couldn't feel it during IC so it was left alone) Thin prep pap obtained: No  Rectal: not examined  Extremities: Edema: none         No results found for this or any previous visit (from the past 24 hour(s)).  Assessment & Plan:  1) Postpartum exam 2) Six wks s/p spontaneous vaginal delivery 3) breast feeding 4) Depression screening 5) Contraception counseling  Essential components of care per ACOG recommendations:  1.  Mood and well being:  If positive depression screen, discussed and plan developed.  If using tobacco we discussed reduction/cessation and risk of relapse If current substance abuse, we discussed and referral to local resources was offered.   2. Infant care and feeding:  If breastfeeding, discussed returning to work, pumping, breastfeeding-associated pain, guidance regarding return to fertility while lactating if not using another method. If needed, patient was provided with a letter to be allowed to pump q 2-3hrs to support lactation in a private location with  access to a refrigerator to store breastmilk.   Recommended that all caregivers be immunized for flu, pertussis and other preventable communicable diseases If pt does not have material needs met for her/baby, referred to local resources for help obtaining these.  3. Sexuality, contraception and birth spacing Provided guidance regarding sexuality, management of dyspareunia, and resumption of intercourse Discussed avoiding interpregnancy interval <53mths  and recommended birth spacing of 18 months  4. Sleep and fatigue Discussed coping options for fatigue and sleep disruption Encouraged family/partner/community support of 4 hrs of uninterrupted sleep to help with mood and fatigue  5. Physical recovery  If pt had a C/S, assessed incisional pain and providing guidance on normal vs prolonged recovery If pt had a laceration, perineal healing and pain reviewed.  If urinary or fecal incontinence, discussed management and referred to PT or uro/gyn if indicated  Patient is safe to resume physical activity. Discussed attainment of healthy weight.  6.  Chronic disease management Discussed pregnancy complications if any, and their implications for future childbearing and long-term maternal health. Review recommendations for prevention of recurrent pregnancy complications, such as 17 hydroxyprogesterone caproate to reduce risk for recurrent PTB not applicable, or aspirin to reduce risk of preeclampsia not applicable. Pt had GDM: no. If yes, 2hr GTT scheduled: not applicable. Reviewed medications and non-pregnant dosing including consideration of whether pt is breastfeeding using a reliable resource such as LactMed: yes Referred for f/u w/ PCP or subspecialist providers as indicated: no; has PCP  7. Health maintenance Mammogram at 27yo or earlier if indicated Pap smears as indicated  Meds: No orders of the defined types were placed in this encounter.   Follow-up: Return in about 1 year (around  12/10/2023) for Pap & Physical.   No orders of the defined types were placed in this encounter.   Arabella Merles CNM 12/10/2022 2:08 PM

## 2023-01-01 ENCOUNTER — Ambulatory Visit (HOSPITAL_COMMUNITY)
Admission: RE | Admit: 2023-01-01 | Discharge: 2023-01-01 | Disposition: A | Discharge status: Home / Self Care | Payer: Managed Care, Other (non HMO) | Source: Ambulatory Visit | Attending: Urology | Admitting: Urology

## 2023-01-01 DIAGNOSIS — R3129 Other microscopic hematuria: Secondary | ICD-10-CM | POA: Insufficient documentation

## 2023-01-01 DIAGNOSIS — N200 Calculus of kidney: Secondary | ICD-10-CM | POA: Insufficient documentation

## 2023-01-01 DIAGNOSIS — N1339 Other hydronephrosis: Secondary | ICD-10-CM | POA: Insufficient documentation

## 2023-01-19 ENCOUNTER — Ambulatory Visit: Payer: Managed Care, Other (non HMO) | Admitting: Urology

## 2023-01-19 ENCOUNTER — Encounter: Payer: Self-pay | Admitting: Urology

## 2023-01-19 VITALS — BP 105/67 | HR 80

## 2023-01-19 DIAGNOSIS — N200 Calculus of kidney: Secondary | ICD-10-CM | POA: Diagnosis not present

## 2023-01-19 LAB — URINALYSIS, ROUTINE W REFLEX MICROSCOPIC
Bilirubin, UA: NEGATIVE
Glucose, UA: NEGATIVE
Ketones, UA: NEGATIVE
Leukocytes,UA: NEGATIVE
Nitrite, UA: NEGATIVE
Protein,UA: NEGATIVE
RBC, UA: NEGATIVE
Specific Gravity, UA: 1.025 (ref 1.005–1.030)
Urobilinogen, Ur: 0.2 mg/dL (ref 0.2–1.0)
pH, UA: 6 (ref 5.0–7.5)

## 2023-01-19 NOTE — Progress Notes (Unsigned)
01/19/2023 9:17 AM   Kelly Watts April 16, 1996 [id number]  Referring provider: Park Meo, FNP [address] [place],  [place] [address]  No chief complaint on file.   HPI:  F/u -    1) right hydronephrosis-right hydronephrosis was noted on ultrasound Sep 24, 2022.  She was pregnant and gave birth on Jun 2024. Right flank resolved.    2) kidney stones-on the renal ultrasound there was a 7 mm left renal [place].  She did have a CT scan in 2019 and there were 2 left stones in the left lower pole these appeared at an area of possible scarring and outside of the collecting system.   She has passed a [place] and did have ESWL in 2019. He did have reflux as a child.    3) microscopic hematuria-there is greater than 30 red blood cells per high-powered field on her urinalysis Jul 2024. No gross hematuria. Still with some post partum vaginal bleeding.   Today, seen for the above. An Aug 2024 renal US revealed potentially a 5 mm and 3 mm left renal [place]. I ordered a KUB but that wasn't done. No gross hematuria. She couldn't leave a specimen. No flank pain.   UA clear.   She has three kids. Here with the middle and the 100 mo old.   PMH: Past Medical History:  Diagnosis Date   Angio-edema    Asthma    Eczema    Encounter for Nexplanon removal 11/16/2015   History of kidney stones    History of kidney stones    Kidney infection    Miscarriage    Nexplanon insertion 11/02/2012   Inserted left arm 11/02/12   Patient underweight    Recurrent upper respiratory infection (URI)    Urticaria     Surgical History: Past Surgical History:  Procedure Laterality Date   DILATION AND CURETTAGE OF UTERUS N/A 08/06/2021   Procedure: SUCTION DILATATION AND CURETTAGE;  Surgeon: Myna Hidalgo, DO;  Location: [facility];  Service: Gynecology;  Laterality: N/A;  pt knows to be NPO, will arrive at 10:00 - no PAT   EAR TUBE REMOVAL     EXTRACORPOREAL SHOCK WAVE LITHOTRIPSY Left 10/30/2016    Procedure: LEFT EXTRACORPOREAL SHOCK WAVE LITHOTRIPSY (ESWL);  Surgeon: Crist Fat, MD;  Location: [facility];  Service: Urology;  Laterality: Left;   TYMPANOSTOMY TUBE PLACEMENT     WISDOM TOOTH EXTRACTION      Home Medications:  Allergies as of 01/19/2023       Reactions   Penicillins Hives   Sulfa Antibiotics Hives        Medication List        Accurate as of January 19, 2023  9:17 AM. If you have any questions, ask your nurse or doctor.          PRENATAL VITAMIN PO Take 1 tablet by mouth daily.        Allergies:  Allergies  Allergen Reactions   Penicillins Hives   Sulfa Antibiotics Hives    Family History: Family History  Problem Relation Age of Onset   Allergic rhinitis Father    Asthma Mother    Allergic rhinitis Mother    Coronary artery disease Paternal Grandfather    Cancer Paternal Grandmother        breast   Diabetes Maternal Grandmother    Thyroid disease Maternal Grandmother    Heart disease Maternal Grandmother    Melanoma Maternal Grandfather  Allergic rhinitis Brother    Allergic rhinitis Brother    Angioedema Neg Hx    Atopy Neg Hx    Eczema Neg Hx    Immunodeficiency Neg Hx    Urticaria Neg Hx     Social History:  reports that she quit smoking about 6 years ago. Her smoking use included cigarettes. She started smoking about 9 years ago. She has a 0.8 pack-year smoking history. She has never used smokeless tobacco. She reports that she does not currently use alcohol. She reports that she does not use drugs.   Physical Exam: BP 105/67   Pulse 80   Constitutional:  Alert and oriented, No acute distress. HEENT: [place] AT, moist mucus membranes.  Trachea midline, no masses. Cardiovascular: No clubbing, cyanosis, or edema. Respiratory: Normal respiratory effort, no increased work of breathing. GI: Abdomen is soft, nontender, nondistended, no abdominal masses GU: No CVA tenderness Skin: No rashes, bruises or suspicious  lesions. Neurologic: Grossly intact, no focal deficits, moving all 4 extremities. Psychiatric: Normal mood and affect.  Laboratory Data: Lab Results  Component Value Date   WBC 9.2 10/26/2022   HGB 9.5 (L) 10/26/2022   HCT 29.0 (L) 10/26/2022   MCV 83.1 10/26/2022   PLT 232 10/26/2022    Lab Results  Component Value Date   CREATININE 0.74 10/29/2021    No results found for: "PSA"  No results found for: "TESTOSTERONE"  No results found for: "HGBA1C"  Urinalysis    Component Value Date/Time   COLORURINE STRAW (A) 09/24/2022 2011   APPEARANCEUR Clear 11/17/2022 1309   LABSPEC 1.002 (L) 09/24/2022 2011   PHURINE 7.0 09/24/2022 2011   GLUCOSEU Negative 11/17/2022 1309   HGBUR MODERATE (A) 09/24/2022 2011   HGBUR negative 09/01/2007 0803   BILIRUBINUR Negative 11/17/2022 1309   KETONESUR NEGATIVE 09/24/2022 2011   PROTEINUR Negative 11/17/2022 1309   PROTEINUR NEGATIVE 09/24/2022 2011   UROBILINOGEN 0.2 09/30/2016 1803   NITRITE Negative 11/17/2022 1309   NITRITE NEGATIVE 09/24/2022 2011   LEUKOCYTESUR Trace (A) 11/17/2022 1309   LEUKOCYTESUR NEGATIVE 09/24/2022 2011    Lab Results  Component Value Date   LABMICR See below: 11/17/2022   WBCUA 0-5 11/17/2022   RBCUA 0-2 01/20/2017   LABEPIT 0-10 11/17/2022   MUCUS Present 01/20/2017   BACTERIA Few (A) 11/17/2022    Pertinent Imaging:  Results for orders placed during the hospital encounter of 10/30/16  DG Abd 1 View  Narrative CLINICAL DATA:  Left renal [place].  EXAM: ABDOMEN - 1 VIEW  COMPARISON:  10/14/2016 .  FINDINGS: Soft tissue structures are unremarkable. No bowel distention. Nonspecific air-filled loops small bowel noted. Stool in the colon. Faint calcific densities again noted over the left kidney suggesting left nephrolithiasis. No evidence of urolithiasis. No acute bony abnormality identified. Mild lumbar scoliosis concave left.  IMPRESSION: Faint calcific densities again noted over the  left kidney suggesting left nephrolithiasis. No evidence urolithiasis. No acute abnormality identified.   Electronically Signed By: Maisie Fus  Register On: 10/30/2016 11:38   Results for orders placed during the hospital encounter of 01/01/23  US RENAL  Narrative CLINICAL DATA:  No hydronephrosis.  EXAM: RENAL / URINARY TRACT ULTRASOUND COMPLETE  COMPARISON:  Renal ultrasound 09/24/2022  FINDINGS: Right Kidney:  Renal measurements: 11.3 x 4.8 x 5.3 cm = volume: 151.1 mL. Echogenicity within normal limits. No mass or hydronephrosis visualized.  Left Kidney:  Renal measurements: 8.6 x 5.1 x 4.2 cm = volume: 95.8 mL. Mild renal scarring. No renal  mass. No hydronephrosis. There is a 5 mm Crew within the inferior pole.  Bladder:  Appears normal for degree of bladder distention.  Other:  None.  IMPRESSION: 1. No hydronephrosis. 2. Left nephrolithiasis.   Electronically Signed By: Annia Belt M.D. On: 01/01/2023 15:51  No valid procedures specified. No results found for this or any previous visit.  No results found for this or any previous visit.   Assessment & Plan:    1. Renal [place] looks good. No hydro. No evidence of a large [place]. Check KUB in 6 mo.     No follow-ups on file.  Jerilee Field, MD  [facility]  [address] [place], [place] [address] [phone]

## 2023-01-28 ENCOUNTER — Encounter: Payer: Managed Care, Other (non HMO) | Admitting: Family Medicine

## 2023-02-11 ENCOUNTER — Ambulatory Visit
Admission: EM | Admit: 2023-02-11 | Discharge: 2023-02-11 | Disposition: A | Discharge status: Home / Self Care | Payer: Managed Care, Other (non HMO) | Attending: Family Medicine | Admitting: Family Medicine

## 2023-02-11 ENCOUNTER — Ambulatory Visit: Payer: Managed Care, Other (non HMO)

## 2023-02-11 DIAGNOSIS — M79645 Pain in left finger(s): Secondary | ICD-10-CM | POA: Diagnosis not present

## 2023-02-11 NOTE — Discharge Instructions (Signed)
Your x-ray today did not show any fractures.  Ice, elevate, ibuprofen and Tylenol as needed.

## 2023-02-11 NOTE — ED Triage Notes (Addendum)
Pt c/o injury to the left index finger pt states  she was cleaning her shower and scrubbing tub with the hand, pressed, left hand down to hold the balance, she slipped and her finger went out and out. Swelling is present, pain is from knuckle into the wrist.

## 2023-02-15 NOTE — ED Provider Notes (Signed)
[facility]    CSN: [id number] Arrival date & time: 02/11/23  0830      History   Chief Complaint No chief complaint on file.   HPI Kelly Watts is a 27 y.o. female.   Patient presenting today with 1 day history of left index finger pain after hyperextending the finger while cleaning and scrubbing the shower.  Having pain at the base of the finger with some swelling.  Denies loss of range of motion, numbness, tingling, discoloration, skin injury.  So far not tried anything over-the-counter for symptoms.    Past Medical History:  Diagnosis Date   Angio-edema    Asthma    Eczema    Encounter for Nexplanon removal 11/16/2015   History of kidney stones    History of kidney stones    Kidney infection    Miscarriage    Nexplanon insertion 11/02/2012   Inserted left arm 11/02/12   Patient underweight    Recurrent upper respiratory infection (URI)    Urticaria     Patient Active Problem List   Diagnosis Date Noted   Contraception, device intrauterine 10/26/2022   History of preterm premature rupture of membranes (PPROM) 05/15/2022   Asthma 04/17/2022   Breast disorder 08/29/2021   History of miscarriage 08/29/2021   Nephrolithiasis 10/13/2016   Kidney cyst, acquired 10/13/2016   Rh negative state in antepartum period 07/21/2012    Past Surgical History:  Procedure Laterality Date   DILATION AND CURETTAGE OF UTERUS N/A 08/06/2021   Procedure: SUCTION DILATATION AND CURETTAGE;  Surgeon: Myna Hidalgo, DO;  Location: [facility];  Service: Gynecology;  Laterality: N/A;  pt knows to be NPO, will arrive at 10:00 - no PAT   EAR TUBE REMOVAL     EXTRACORPOREAL SHOCK WAVE LITHOTRIPSY Left 10/30/2016   Procedure: LEFT EXTRACORPOREAL SHOCK WAVE LITHOTRIPSY (ESWL);  Surgeon: Crist Fat, MD;  Location: [facility];  Service: Urology;  Laterality: Left;   TYMPANOSTOMY TUBE PLACEMENT     WISDOM TOOTH EXTRACTION      OB History     Gravida  6   Para  4   Term   3   Preterm  1   AB  2   Living  3      SAB  2   IAB      Ectopic      Multiple  0   Live Births  3          Home Medications    Prior to Admission medications   Medication Sig Start Date End Date Taking? Authorizing Provider  Prenatal Vit-Fe Fumarate-FA (PRENATAL VITAMIN PO) Take 1 tablet by mouth daily.    [provider]    Family History Family History  Problem Relation Age of Onset   Allergic rhinitis Father    Asthma Mother    Allergic rhinitis Mother    Coronary artery disease Paternal Grandfather    Cancer Paternal Grandmother        breast   Diabetes Maternal Grandmother    Thyroid disease Maternal Grandmother    Heart disease Maternal Grandmother    Melanoma Maternal Grandfather    Allergic rhinitis Brother    Allergic rhinitis Brother    Angioedema Neg Hx    Atopy Neg Hx    Eczema Neg Hx    Immunodeficiency Neg Hx    Urticaria Neg Hx     Social History Social History   Tobacco Use   Smoking status: Former  Current packs/day: 0.00    Average packs/day: 0.3 packs/day for 3.0 years (0.8 ttl pk-yrs)    Types: Cigarettes    Start date: 01/29/2013    Quit date: 01/30/2016    Years since quitting: 7.0   Smokeless tobacco: Never  Vaping Use   Vaping status: Never Used  Substance Use Topics   Alcohol use: Not Currently    Comment: occ   Drug use: No     Allergies   Penicillins and Sulfa antibiotics   Review of Systems Review of Systems PER HPI  Physical Exam Triage Vital Signs ED Triage Vitals  Encounter Vitals Group     BP 02/11/23 0837 112/74     Systolic BP Percentile --      Diastolic BP Percentile --      Pulse Rate 02/11/23 0837 90     Resp 02/11/23 0837 18     Temp 02/11/23 0837 97.9 F (36.6 C)     Temp Source 02/11/23 0837 Oral     SpO2 02/11/23 0837 95 %     Weight --      Height --      Head Circumference --      Peak Flow --      Pain Score 02/11/23 0840 6     Pain Loc --      Pain Education  --      Exclude from Growth Chart --    No data found.  Updated Vital Signs BP 112/74 (BP Location: Right Arm)   Pulse 90   Temp 97.9 F (36.6 C) (Oral)   Resp 18   SpO2 95%   Breastfeeding Yes   Visual Acuity Right Eye Distance:   Left Eye Distance:   Bilateral Distance:    Right Eye Near:   Left Eye Near:    Bilateral Near:     Physical Exam Vitals and nursing note reviewed.  Constitutional:      Appearance: Normal appearance. She is not ill-appearing.  HENT:     Head: Atraumatic.  Eyes:     Extraocular Movements: Extraocular movements intact.     Conjunctiva/sclera: Conjunctivae normal.  Cardiovascular:     Rate and Rhythm: Normal rate and regular rhythm.     Heart sounds: Normal heart sounds.  Pulmonary:     Effort: Pulmonary effort is normal.     Breath sounds: Normal breath sounds.  Musculoskeletal:        General: Swelling, tenderness and signs of injury present. No deformity. Normal range of motion.     Cervical back: Normal range of motion and neck supple.     Comments: Minimal tenderness to palpation to the base of the left index finger, no bony deformity palpable and range of motion intact  Skin:    General: Skin is warm and dry.     Findings: No bruising or erythema.  Neurological:     Mental Status: She is alert and oriented to person, place, and time.     Motor: No weakness.     Gait: Gait normal.     Comments: Left hand neurovascular intact  Psychiatric:        Mood and Affect: Mood normal.        Thought Content: Thought content normal.        Judgment: Judgment normal.      UC Treatments / Results  Labs (all labs ordered are listed, but only abnormal results are displayed) Labs Reviewed - No data to display  EKG   Radiology No results found.  Procedures Procedures (including critical care time)  Medications Ordered in UC Medications - No data to display  Initial Impression / Assessment and Plan / UC Course  I have reviewed  the triage vital signs and the nursing notes.  Pertinent labs & imaging results that were available during my care of the patient were reviewed by me and considered in my medical decision making (see chart for details).     X-ray of the left hand negative for acute bony abnormality.  Discussed RICE protocol, over-the-counter pain relievers as needed.  Return for worsening symptoms.  Final Clinical Impressions(s) / UC Diagnoses   Final diagnoses:  Finger pain, left     Discharge Instructions      Your x-ray today did not show any fractures.  Ice, elevate, ibuprofen and Tylenol as needed.    ED Prescriptions   None    PDMP not reviewed this encounter.   Roosvelt Maser [place], [place] 02/15/23 [phone]

## 2023-02-16 ENCOUNTER — Encounter: Payer: Self-pay | Admitting: Women's Health

## 2023-02-16 ENCOUNTER — Other Ambulatory Visit (HOSPITAL_COMMUNITY)
Admission: RE | Admit: 2023-02-16 | Discharge: 2023-02-16 | Disposition: A | Discharge status: Home / Self Care | Payer: Managed Care, Other (non HMO) | Source: Ambulatory Visit | Attending: Women's Health | Admitting: Women's Health

## 2023-02-16 ENCOUNTER — Ambulatory Visit (INDEPENDENT_AMBULATORY_CARE_PROVIDER_SITE_OTHER): Payer: Managed Care, Other (non HMO) | Admitting: Women's Health

## 2023-02-16 VITALS — BP 102/70 | HR 74 | Ht 63.0 in | Wt 139.3 lb

## 2023-02-16 DIAGNOSIS — Z30431 Encounter for routine checking of intrauterine contraceptive device: Secondary | ICD-10-CM

## 2023-02-16 DIAGNOSIS — R102 Pelvic and perineal pain: Secondary | ICD-10-CM | POA: Insufficient documentation

## 2023-02-16 NOTE — Progress Notes (Signed)
GYN VISIT Patient name: NICOLETA DEMMITT MRN [id number]  Date of birth: 02-28-1996 Chief Complaint:   Iud check  History of Present Illness:   SACHEEN KRUMWIEDE is a 27 y.o. [id number] Caucasian female being seen today for IUD check. Paragard placed postplacentally 10/26/22, began to have bad cramps 1 week ago, has to take meds which do help. Denies abnormal discharge, itching/odor/irritation.      No LMP recorded. (Menstrual status: IUD). The current method of family planning is IUD.  Last pap 10/24/20. Results were: NILM w/ HRHPV negative     04/17/2022    2:39 PM 10/29/2021   11:00 AM 10/29/2021   10:55 AM 12/05/2019    9:16 AM 08/24/2019    3:37 PM  Depression screen PHQ 2/9  Decreased Interest 0 0 0 0 0  Down, Depressed, Hopeless 0 0 0 0 0  PHQ - 2 Score 0 0 0 0 0  Altered sleeping 3 0 0 0 0  Tired, decreased energy 0 0 0 0 1  Change in appetite 0 0 0 0 0  Feeling bad or failure about yourself  0 0 0 0 0  Trouble concentrating 0 0 0 0 0  Moving slowly or fidgety/restless 0 0 0 0 0  Suicidal thoughts 0 0 0 0 0  PHQ-9 Score 3 0 0 0 1  Difficult doing work/chores     Not difficult at all        04/17/2022    2:39 PM 10/29/2021   10:55 AM 12/05/2019    9:16 AM 08/24/2019    3:37 PM  GAD 7 : Generalized Anxiety Score  Nervous, Anxious, on Edge 0 0 0 0  Control/stop worrying 0 0 0 0  Worry too much - different things 0 0 0 0  Trouble relaxing 0 0 0 0  Restless 0 0 0 0  Easily annoyed or irritable 0 0 0 0  Afraid - awful might happen 0 0 0 0  Total GAD 7 Score 0 0 0 0  Anxiety Difficulty    Not difficult at all     Review of Systems:   Pertinent items are noted in HPI Denies fever/chills, dizziness, headaches, visual disturbances, fatigue, shortness of breath, chest pain, abdominal pain, vomiting, abnormal vaginal discharge/itching/odor/irritation, problems with periods, bowel movements, urination, or intercourse unless otherwise stated above.  Pertinent History Reviewed:   Reviewed past medical,surgical, social, obstetrical and family history.  Reviewed problem list, medications and allergies. Physical Assessment:   Vitals:   02/16/23 1409  BP: 102/70  Pulse: 74  Weight: 139 lb 4.8 oz (63.2 kg)  Height: 5\' 3"  (1.6 m)  Body mass index is 24.68 kg/m.       Physical Examination: by Everlena Cooper, SNM  General appearance: alert, well appearing, and in no distress  Mental status: alert, oriented to person, place, and time  Skin: warm & dry   Cardiovascular: normal heart rate noted  Respiratory: normal respiratory effort, no distress  Abdomen: soft, non-tender   Pelvic: IUD strings visible, longer than normal- trimmed after placement verified  Extremities: no edema   Informal TV u/s by me (confirmed by Pam, ultrasonographer)- IUD in correct fundal placement  Chaperone:  me     No results found for this or any previous visit (from the past 24 hour(s)).  Assessment & Plan:  1) IUD check> in correct placement  2) Pelvic cramping> CV swab sent  Meds: No orders of the defined types were placed  in this encounter.   No orders of the defined types were placed in this encounter.   Return in about 1 year (around 02/16/2024) for Physical.  Cheral Marker CNM, [facility] 02/16/2023 2:44 PM

## 2023-02-18 LAB — CERVICOVAGINAL ANCILLARY ONLY
Bacterial Vaginitis (gardnerella): POSITIVE — AB
Candida Glabrata: NEGATIVE
Candida Vaginitis: NEGATIVE
Chlamydia: NEGATIVE
Comment: NEGATIVE
Comment: NEGATIVE
Comment: NEGATIVE
Comment: NEGATIVE
Comment: NEGATIVE
Comment: NORMAL
Neisseria Gonorrhea: NEGATIVE
Trichomonas: NEGATIVE

## 2023-02-18 MED ORDER — METRONIDAZOLE 500 MG PO TABS
500.0000 mg | ORAL_TABLET | Freq: Two times a day (BID) | ORAL | 0 refills | Status: DC
Start: 2023-02-18 — End: 2023-06-16

## 2023-02-18 NOTE — Addendum Note (Signed)
Addended by: Cheral Marker on: 02/18/2023 12:34 PM   Modules accepted: Orders

## 2023-03-03 ENCOUNTER — Ambulatory Visit (INDEPENDENT_AMBULATORY_CARE_PROVIDER_SITE_OTHER): Payer: Managed Care, Other (non HMO) | Admitting: Family Medicine

## 2023-03-03 ENCOUNTER — Encounter: Payer: Self-pay | Admitting: Family Medicine

## 2023-03-03 VITALS — BP 106/70 | HR 73 | Temp 97.6°F | Ht 63.0 in | Wt 139.8 lb

## 2023-03-03 DIAGNOSIS — Z0000 Encounter for general adult medical examination without abnormal findings: Secondary | ICD-10-CM | POA: Diagnosis not present

## 2023-03-03 NOTE — Progress Notes (Signed)
Complete physical exam  Patient: Kelly Watts   DOB: 1995/07/08   27 y.o. Female  MRN: [id number]  Subjective:    Chief Complaint  Patient presents with   Annual Exam    Pt has had pap smear, 6 weeks postpartum.     Kelly Watts is a 27 y.o. female who presents today for a complete physical exam. She reports consuming a general diet. The patient does not participate in regular exercise at present. She generally feels well. She reports sleeping well. She does not have additional problems to discuss today. She does see OBGYN and is 4 months post-partum, she is breastfeeding.   Most recent fall risk assessment:    03/03/2023    9:41 AM  Fall Risk   Falls in the past year? 0  Number falls in past yr: 0  Injury with Fall? 0  Risk for fall due to : No Fall Risks  Follow up Falls prevention discussed     Most recent depression screenings:    03/03/2023    9:41 AM 04/17/2022    2:39 PM  PHQ 2/9 Scores  PHQ - 2 Score 0 0  PHQ- 9 Score  3    Dental: No current dental problems and Receives regular dental care  Patient Active Problem List   Diagnosis Date Noted   Physical exam, annual 03/03/2023   Contraception, device intrauterine 10/26/2022   History of preterm premature rupture of membranes (PPROM) 05/15/2022   Asthma 04/17/2022   Breast disorder 08/29/2021   History of miscarriage 08/29/2021   Nephrolithiasis 10/13/2016   Kidney cyst, acquired 10/13/2016   Rh negative state in antepartum period 07/21/2012   Past Medical History:  Diagnosis Date   Angio-edema    Asthma    Eczema    Encounter for Nexplanon removal 11/16/2015   History of kidney stones    History of kidney stones    Kidney infection    Miscarriage    Nexplanon insertion 11/02/2012   Inserted left arm 11/02/12   Patient underweight    Recurrent upper respiratory infection (URI)    Urticaria    Past Surgical History:  Procedure Laterality Date   DILATION AND CURETTAGE OF UTERUS N/A  08/06/2021   Procedure: SUCTION DILATATION AND CURETTAGE;  Surgeon: Myna Hidalgo, DO;  Location: [facility];  Service: Gynecology;  Laterality: N/A;  pt knows to be NPO, will arrive at 10:00 - no PAT   EAR TUBE REMOVAL     EXTRACORPOREAL SHOCK WAVE LITHOTRIPSY Left 10/30/2016   Procedure: LEFT EXTRACORPOREAL SHOCK WAVE LITHOTRIPSY (ESWL);  Surgeon: Crist Fat, MD;  Location: [facility];  Service: Urology;  Laterality: Left;   TYMPANOSTOMY TUBE PLACEMENT     WISDOM TOOTH EXTRACTION     Social History   Socioeconomic History   Marital status: Married    Spouse name: Zynia Borgman   Number of children: 3   Years of education: Not on file   Highest education level: Not on file  Occupational History   Not on file  Tobacco Use   Smoking status: Former    Current packs/day: 0.00    Average packs/day: 0.3 packs/day for 3.0 years (0.8 ttl pk-yrs)    Types: Cigarettes    Start date: 01/29/2013    Quit date: 01/30/2016    Years since quitting: 7.0   Smokeless tobacco: Never  Vaping Use   Vaping status: Never Used  Substance and Sexual Activity   Alcohol use: Not Currently  Comment: occ   Drug use: No   Sexual activity: Yes    Birth control/protection: I.U.D.  Other Topics Concern   Not on file  Social History Narrative   Not on file   Social Determinants of Health   Financial Resource Strain: Low Risk  (10/29/2021)   Overall Financial Resource Strain (CARDIA)    Difficulty of Paying Living Expenses: Not hard at all  Food Insecurity: No Food Insecurity (10/26/2022)   Hunger Vital Sign    Worried About Running Out of Food in the Last Year: Never true    Ran Out of Food in the Last Year: Never true  Transportation Needs: No Transportation Needs (10/26/2022)   PRAPARE - Administrator, Civil Service (Medical): No    Lack of Transportation (Non-Medical): No  Physical Activity: Sufficiently Active (10/29/2021)   Exercise Vital Sign    Days of Exercise per Week: 7 days     Minutes of Exercise per Session: 30 min  Stress: No Stress Concern Present (10/29/2021)   [employer] of Occupational Health - Occupational Stress Questionnaire    Feeling of Stress : Not at all  Social Connections: Moderately Integrated (10/29/2021)   Social Connection and Isolation Panel [NHANES]    Frequency of Communication with Friends and Family: More than three times a week    Frequency of Social Gatherings with Friends and Family: More than three times a week    Attends Religious Services: 1 to 4 times per year    Active Member of [employer] or Organizations: No    Attends Banker Meetings: Never    Marital Status: Married  Catering manager Violence: Not At Risk (10/26/2022)   Humiliation, Afraid, Rape, and Kick questionnaire    Fear of Current or Ex-Partner: No    Emotionally Abused: No    Physically Abused: No    Sexually Abused: No   Family History  Problem Relation Age of Onset   Allergic rhinitis Father    Asthma Mother    Allergic rhinitis Mother    Coronary artery disease Paternal Grandfather    Cancer Paternal Grandmother        breast   Diabetes Maternal Grandmother    Thyroid disease Maternal Grandmother    Heart disease Maternal Grandmother    Melanoma Maternal Grandfather    Allergic rhinitis Brother    Allergic rhinitis Brother    Angioedema Neg Hx    Atopy Neg Hx    Eczema Neg Hx    Immunodeficiency Neg Hx    Urticaria Neg Hx    Allergies  Allergen Reactions   Penicillins Hives   Sulfa Antibiotics Hives      Patient Care Team: Park Meo, FNP as PCP - General (Family Medicine)   Outpatient Medications Prior to Visit  Medication Sig   Prenatal Vit-Fe Fumarate-FA (PRENATAL VITAMIN PO) Take 1 tablet by mouth daily.   metroNIDAZOLE (FLAGYL) 500 MG tablet Take 1 tablet (500 mg total) by mouth 2 (two) times daily. (Patient not taking: Reported on 03/03/2023)   No facility-administered medications prior to visit.    Review of  Systems  Constitutional: Negative.   HENT: Negative.    Eyes: Negative.   Respiratory: Negative.    Cardiovascular: Negative.   Gastrointestinal: Negative.   Genitourinary: Negative.   Musculoskeletal: Negative.   Skin: Negative.   Neurological: Negative.   Endo/Heme/Allergies: Negative.   Psychiatric/Behavioral: Negative.    All other systems reviewed and are negative.  Objective:     BP 106/70 (BP Location: Left Arm, Patient Position: Sitting, Cuff Size: Normal)   Pulse 73   Temp 97.6 F (36.4 C)   Ht 5\' 3"  (1.6 m)   Wt 139 lb 12.8 oz (63.4 kg)   SpO2 95%   Breastfeeding Yes   BMI 24.76 kg/m  BP Readings from Last 3 Encounters:  03/03/23 106/70  02/16/23 102/70  02/11/23 112/74   Wt Readings from Last 3 Encounters:  03/03/23 139 lb 12.8 oz (63.4 kg)  02/16/23 139 lb 4.8 oz (63.2 kg)  12/10/22 135 lb (61.2 kg)      Physical Exam Vitals and nursing note reviewed.  Constitutional:      Appearance: Normal appearance. She is normal weight.  HENT:     Head: Normocephalic and atraumatic.     Right Ear: Tympanic membrane, ear canal and external ear normal.     Left Ear: Tympanic membrane, ear canal and external ear normal.     Nose: Nose normal.     Mouth/Throat:     Mouth: Mucous membranes are moist.     Pharynx: Oropharynx is clear.  Eyes:     Extraocular Movements: Extraocular movements intact.     Conjunctiva/sclera: Conjunctivae normal.     Pupils: Pupils are equal, round, and reactive to light.  Cardiovascular:     Rate and Rhythm: Normal rate and regular rhythm.     Pulses: Normal pulses.     Heart sounds: Normal heart sounds.  Pulmonary:     Effort: Pulmonary effort is normal.     Breath sounds: Normal breath sounds.  Abdominal:     General: Bowel sounds are normal.     Palpations: Abdomen is soft.  Musculoskeletal:        General: Normal range of motion.     Cervical back: Normal range of motion and neck supple.  Skin:    General:  Skin is warm and dry.     Capillary Refill: Capillary refill takes less than 2 seconds.  Neurological:     General: No focal deficit present.     Mental Status: She is alert and oriented to person, place, and time. Mental status is at baseline.  Psychiatric:        Mood and Affect: Mood normal.        Behavior: Behavior normal.        Thought Content: Thought content normal.        Judgment: Judgment normal.      No results found for any visits on 03/03/23.     Assessment & Plan:    Routine Health Maintenance and Physical Exam  Immunization History  Administered Date(s) Administered   DTP 10/21/1995, 12/16/1995, 02/16/1996, 11/15/1996, 10/01/1999   HIB (PRP-OMP) 10/21/1995, 12/16/1995, 02/16/1996, 11/15/1996   Hepatitis B 05-01-1996, 10/21/1995, 02/16/1996   Influenza,inj,Quad PF,6+ Mos 01/20/2017, 03/07/2019   MMR 08/16/1996, 10/01/1999   OPV 10/21/1995, 12/16/1995, 11/15/1996, 10/01/1999   PPD Test 12/14/2013   Rho (D) Immune Globulin 07/25/2012, 08/01/2021, 08/19/2022   Tdap 02/24/2013, 12/05/2019, 07/29/2022   Varicella 08/16/1996    Health Maintenance  Topic Date Due   COVID-19 Vaccine (1 - 2023-24 season) 03/19/2023 (Originally 01/04/2023)   INFLUENZA VACCINE  08/03/2023 (Originally 12/04/2022)   Cervical Cancer Screening (Pap smear)  10/25/2023   DTaP/Tdap/Td (9 - Td or Tdap) 07/28/2032   Hepatitis C Screening  Completed   HIV Screening  Completed   HPV VACCINES  Aged Out    Discussed health benefits  of physical activity, and encouraged her to engage in regular exercise appropriate for her age and condition.  Problem List Items Addressed This Visit     Physical exam, annual - Primary    Today your medical history was reviewed and routine physical exam with labs was performed. Recommend 150 minutes of moderate intensity exercise weekly and consuming a well-balanced diet as well as good sleep hygeine. Vaccine maintenance discussed. Appropriate health maintenance  items reviewed. Return to office in 1 year for annual physical exam.       Relevant Orders   CBC with Differential/Platelet   COMPLETE METABOLIC PANEL WITH GFR   Lipid panel   Return in about 1 year (around 03/02/2024) for annual physical with labs 1 week prior.     Park Meo, FNP

## 2023-03-03 NOTE — Assessment & Plan Note (Addendum)
Today your medical history was reviewed and routine physical exam with labs was performed. Recommend 150 minutes of moderate intensity exercise weekly and consuming a well-balanced diet as well as good sleep hygeine. Vaccine maintenance discussed. Appropriate health maintenance items reviewed. Return to office in 1 year for annual physical exam.

## 2023-03-04 LAB — COMPLETE METABOLIC PANEL WITH GFR
AG Ratio: 1.8 (calc) (ref 1.0–2.5)
ALT: 13 U/L (ref 6–29)
AST: 14 U/L (ref 10–30)
Albumin: 4.2 g/dL (ref 3.6–5.1)
Alkaline phosphatase (APISO): 112 U/L (ref 31–125)
BUN: 11 mg/dL (ref 7–25)
CO2: 28 mmol/L (ref 20–32)
Calcium: 9.4 mg/dL (ref 8.6–10.2)
Chloride: 105 mmol/L (ref 98–110)
Creat: 0.8 mg/dL (ref 0.50–0.96)
Globulin: 2.4 g/dL (ref 1.9–3.7)
Glucose, Bld: 86 mg/dL (ref 65–99)
Potassium: 4.2 mmol/L (ref 3.5–5.3)
Sodium: 141 mmol/L (ref 135–146)
Total Bilirubin: 0.4 mg/dL (ref 0.2–1.2)
Total Protein: 6.6 g/dL (ref 6.1–8.1)
eGFR: 104 mL/min/{1.73_m2} (ref >=60)

## 2023-03-04 LAB — CBC WITH DIFFERENTIAL/PLATELET
Absolute Lymphocytes: 2464 {cells}/uL (ref 850–3900)
Absolute Monocytes: 604 {cells}/uL (ref 200–950)
Basophils Absolute: 50 {cells}/uL (ref 0–200)
Basophils Relative: 0.7 %
Eosinophils Absolute: 128 {cells}/uL (ref 15–500)
Eosinophils Relative: 1.8 %
HCT: 39.2 % (ref 35.0–45.0)
Hemoglobin: 13.2 g/dL (ref 11.7–15.5)
MCH: 29.7 pg (ref 27.0–33.0)
MCHC: 33.7 g/dL (ref 32.0–36.0)
MCV: 88.3 fL (ref 80.0–100.0)
MPV: 10.3 fL (ref 7.5–12.5)
Monocytes Relative: 8.5 %
Neutro Abs: 3855 {cells}/uL (ref 1500–7800)
Neutrophils Relative %: 54.3 %
Platelets: 300 10*3/uL (ref 140–400)
RBC: 4.44 10*6/uL (ref 3.80–5.10)
RDW: 12.6 % (ref 11.0–15.0)
Total Lymphocyte: 34.7 %
WBC: 7.1 10*3/uL (ref 3.8–10.8)

## 2023-03-04 LAB — LIPID PANEL
Cholesterol: 172 mg/dL (ref <200)
HDL: 67 mg/dL (ref >=50)
LDL Cholesterol (Calc): 89 mg/dL
Non-HDL Cholesterol (Calc): 105 mg/dL (ref <130)
Total CHOL/HDL Ratio: 2.6 (calc) (ref <5.0)
Triglycerides: 70 mg/dL (ref <150)

## 2023-03-18 ENCOUNTER — Other Ambulatory Visit: Payer: Managed Care, Other (non HMO)

## 2023-03-18 ENCOUNTER — Other Ambulatory Visit: Payer: Self-pay

## 2023-03-18 DIAGNOSIS — A150 Tuberculosis of lung: Secondary | ICD-10-CM

## 2023-03-20 LAB — QUANTIFERON-TB GOLD PLUS
Mitogen-NIL: >10 [IU]/mL
NIL: 0.06 [IU]/mL
QuantiFERON-TB Gold Plus: NEGATIVE
TB1-NIL: 0.01 [IU]/mL
TB2-NIL: 0.01 [IU]/mL

## 2023-04-12 ENCOUNTER — Ambulatory Visit
Admission: EM | Admit: 2023-04-12 | Discharge: 2023-04-12 | Disposition: A | Discharge status: Home / Self Care | Payer: Managed Care, Other (non HMO) | Attending: Nurse Practitioner | Admitting: Nurse Practitioner

## 2023-04-12 DIAGNOSIS — J029 Acute pharyngitis, unspecified: Secondary | ICD-10-CM | POA: Diagnosis present

## 2023-04-12 LAB — POCT RAPID STREP A (OFFICE): Rapid Strep A Screen: NEGATIVE

## 2023-04-12 NOTE — ED Provider Notes (Signed)
[facility]    CSN: [id number] Arrival date & time: 04/12/23  1141      History   Chief Complaint Chief Complaint  Patient presents with   Sore Throat    HPI Kelly Watts is a 27 y.o. female.   The history is provided by the patient.   Patient presents for complaints of sore throat that started over the past 24 hours.  Patient denies fever, chills, headache, ear pain, ear drainage, nasal congestion, runny nose, cough, chest pain, abdominal pain, nausea, vomiting, diarrhea, or rash.  Patient denies any obvious known sick contacts, but states her daughter has the same or similar symptoms.  Past Medical History:  Diagnosis Date   Angio-edema    Asthma    Eczema    Encounter for Nexplanon removal 11/16/2015   History of kidney stones    History of kidney stones    Kidney infection    Miscarriage    Nexplanon insertion 11/02/2012   Inserted left arm 11/02/12   Patient underweight    Recurrent upper respiratory infection (URI)    Urticaria     Patient Active Problem List   Diagnosis Date Noted   Physical exam, annual 03/03/2023   Contraception, device intrauterine 10/26/2022   History of preterm premature rupture of membranes (PPROM) 05/15/2022   Asthma 04/17/2022   Breast disorder 08/29/2021   History of miscarriage 08/29/2021   Nephrolithiasis 10/13/2016   Kidney cyst, acquired 10/13/2016   Rh negative state in antepartum period 07/21/2012    Past Surgical History:  Procedure Laterality Date   DILATION AND CURETTAGE OF UTERUS N/A 08/06/2021   Procedure: SUCTION DILATATION AND CURETTAGE;  Surgeon: Myna Hidalgo, DO;  Location: [facility];  Service: Gynecology;  Laterality: N/A;  pt knows to be NPO, will arrive at 10:00 - no PAT   EAR TUBE REMOVAL     EXTRACORPOREAL SHOCK WAVE LITHOTRIPSY Left 10/30/2016   Procedure: LEFT EXTRACORPOREAL SHOCK WAVE LITHOTRIPSY (ESWL);  Surgeon: Crist Fat, MD;  Location: [facility];  Service: Urology;  Laterality:  Left;   TYMPANOSTOMY TUBE PLACEMENT     WISDOM TOOTH EXTRACTION      OB History     Gravida  6   Para  4   Term  3   Preterm  1   AB  2   Living  3      SAB  2   IAB      Ectopic      Multiple  0   Live Births  3            Home Medications    Prior to Admission medications   Medication Sig Start Date End Date Taking? Authorizing Provider  metroNIDAZOLE (FLAGYL) 500 MG tablet Take 1 tablet (500 mg total) by mouth 2 (two) times daily. Patient not taking: Reported on 03/03/2023 02/18/23   Cheral Marker, CNM  Prenatal Vit-Fe Fumarate-FA (PRENATAL VITAMIN PO) Take 1 tablet by mouth daily.    [provider]    Family History Family History  Problem Relation Age of Onset   Allergic rhinitis Father    Asthma Mother    Allergic rhinitis Mother    Coronary artery disease Paternal Grandfather    Cancer Paternal Grandmother        breast   Diabetes Maternal Grandmother    Thyroid disease Maternal Grandmother    Heart disease Maternal Grandmother    Melanoma Maternal Grandfather    Allergic rhinitis Brother  Allergic rhinitis Brother    Angioedema Neg Hx    Atopy Neg Hx    Eczema Neg Hx    Immunodeficiency Neg Hx    Urticaria Neg Hx     Social History Social History   Tobacco Use   Smoking status: Former    Current packs/day: 0.00    Average packs/day: 0.3 packs/day for 3.0 years (0.8 ttl pk-yrs)    Types: Cigarettes    Start date: 01/29/2013    Quit date: 01/30/2016    Years since quitting: 7.2   Smokeless tobacco: Never  Vaping Use   Vaping status: Never Used  Substance Use Topics   Alcohol use: Not Currently    Comment: occ   Drug use: No     Allergies   Penicillins and Sulfa antibiotics   Review of Systems Review of Systems Per HPI  Physical Exam Triage Vital Signs ED Triage Vitals [04/12/23 1219]  Encounter Vitals Group     BP 121/80     Systolic BP Percentile      Diastolic BP Percentile      Pulse Rate  (!) 117     Resp 18     Temp 98.4 F (36.9 C)     Temp src      SpO2 97 %     Weight      Height      Head Circumference      Peak Flow      Pain Score 3     Pain Loc      Pain Education      Exclude from Growth Chart    No data found.  Updated Vital Signs BP 121/80   Pulse (!) 117   Temp 98.4 F (36.9 C)   Resp 18   SpO2 97%   Breastfeeding Yes Comment: no period due to breastfeeding  Visual Acuity Right Eye Distance:   Left Eye Distance:   Bilateral Distance:    Right Eye Near:   Left Eye Near:    Bilateral Near:     Physical Exam Vitals and nursing note reviewed.  Constitutional:      Appearance: She is well-developed.  HENT:     Head: Normocephalic.     Right Ear: Tympanic membrane, ear canal and external ear normal.     Left Ear: Tympanic membrane, ear canal and external ear normal.     Nose: Nose normal.     Mouth/Throat:     Mouth: Mucous membranes are moist.     Pharynx: Posterior oropharyngeal erythema present.     Comments: Cobblestoning present to posterior oropharynx  Eyes:     Extraocular Movements: Extraocular movements intact.     Conjunctiva/sclera: Conjunctivae normal.     Pupils: Pupils are equal, round, and reactive to light.  Cardiovascular:     Rate and Rhythm: Regular rhythm. Tachycardia present.     Pulses: Normal pulses.     Heart sounds: Normal heart sounds.  Pulmonary:     Effort: Pulmonary effort is normal. No respiratory distress.     Breath sounds: Normal breath sounds. No stridor. No wheezing, rhonchi or rales.  Abdominal:     General: Bowel sounds are normal.     Palpations: Abdomen is soft.     Tenderness: There is no abdominal tenderness.  Musculoskeletal:     Cervical back: Normal range of motion.  Lymphadenopathy:     Cervical: No cervical adenopathy.  Skin:    General: Skin is warm and  dry.  Neurological:     General: No focal deficit present.     Mental Status: She is alert and oriented to person, place, and  time.  Psychiatric:        Mood and Affect: Mood normal.        Behavior: Behavior normal.      UC Treatments / Results  Labs (all labs ordered are listed, but only abnormal results are displayed) Labs Reviewed  CULTURE, GROUP A STREP [facility])  POCT RAPID STREP A (OFFICE)    EKG   Radiology No results found.  Procedures Procedures (including critical care time)  Medications Ordered in UC Medications - No data to display  Initial Impression / Assessment and Plan / UC Course  I have reviewed the triage vital signs and the nursing notes.  Pertinent labs & imaging results that were available during my care of the patient were reviewed by me and considered in my medical decision making (see chart for details).  Rapid strep test is negative, throat culture is pending.  Suspect viral etiology pending the result of the throat culture.  Patient is breast-feeding, recommended supportive care treatment to include Tylenol for pain or discomfort, warm salt water gargles, the use of honey for throat pain or discomfort, and a soft diet.  Discussed indications with the patient of when follow-up be indicated.  Patient was in agreement with this plan of care and verbalizes understanding.  All questions were answered.  Patient stable for discharge.  Final Clinical Impressions(s) / UC Diagnoses   Final diagnoses:  Sore throat     Discharge Instructions      Rapid strep test was negative, throat culture is pending.  You will be contacted if the pending test result is positive.  You will also have access to the results via MyChart.  Take medication as prescribed. Increase fluids and allow for plenty of rest. Recommend Tylenol as needed for pain, fever, or general discomfort. Recommend throat lozenges, Chloraseptic or honey to help with throat pain. Warm salt water gargles 3-4 times daily to help with throat pain or discomfort. Recommend a diet with soft foods to include soups, broths,  puddings, yogurt, Jell-O's, or popsicles until symptoms improve. Follow-up if symptoms do not improve.       ED Prescriptions   None    PDMP not reviewed this encounter.   Abran Cantor, NP 04/12/23 1311

## 2023-04-12 NOTE — Discharge Instructions (Signed)
Rapid strep test was negative, throat culture is pending.  You will be contacted if the pending test result is positive.  You will also have access to the results via MyChart.  Take medication as prescribed. Increase fluids and allow for plenty of rest. Recommend Tylenol as needed for pain, fever, or general discomfort. Recommend throat lozenges, Chloraseptic or honey to help with throat pain. Warm salt water gargles 3-4 times daily to help with throat pain or discomfort. Recommend a diet with soft foods to include soups, broths, puddings, yogurt, Jell-O's, or popsicles until symptoms improve. Follow-up if symptoms do not improve.

## 2023-04-12 NOTE — ED Triage Notes (Signed)
Pt reports she has a sore throat x 1 day

## 2023-04-14 LAB — CULTURE, GROUP A STREP (THRC)

## 2023-06-16 ENCOUNTER — Other Ambulatory Visit (HOSPITAL_COMMUNITY)
Admission: RE | Admit: 2023-06-16 | Discharge: 2023-06-16 | Disposition: A | Discharge status: Home / Self Care | Payer: Managed Care, Other (non HMO) | Source: Ambulatory Visit | Attending: Obstetrics & Gynecology | Admitting: Obstetrics & Gynecology

## 2023-06-16 ENCOUNTER — Other Ambulatory Visit (INDEPENDENT_AMBULATORY_CARE_PROVIDER_SITE_OTHER): Payer: Managed Care, Other (non HMO) | Admitting: *Deleted

## 2023-06-16 ENCOUNTER — Encounter: Payer: Self-pay | Admitting: Women's Health

## 2023-06-16 DIAGNOSIS — N898 Other specified noninflammatory disorders of vagina: Secondary | ICD-10-CM | POA: Diagnosis present

## 2023-06-16 NOTE — Progress Notes (Signed)
   NURSE VISIT- VAGINITIS  SUBJECTIVE:  Kelly Watts is a 28 y.o. [id number] GYN patientfemale here for a vaginal swab for vaginitis screening.  She reports the following symptoms: local irritation and vulvar itching for 1 days. Denies abnormal vaginal bleeding, significant pelvic pain, fever, or UTI symptoms.  OBJECTIVE:  Breastfeeding Yes   Appears well, in no apparent distress  ASSESSMENT: Vaginal swab for vaginitis screening  PLAN: Self-collected vaginal probe for Bacterial Vaginosis, Yeast sent to lab Treatment: to be determined once results are received Follow-up as needed if symptoms persist/worsen, or new symptoms develop  Jobe Marker  06/16/2023 3:07 PM

## 2023-06-18 ENCOUNTER — Other Ambulatory Visit: Payer: Self-pay | Admitting: Adult Health

## 2023-06-18 LAB — CERVICOVAGINAL ANCILLARY ONLY
Bacterial Vaginitis (gardnerella): POSITIVE — AB
Candida Glabrata: NEGATIVE
Candida Vaginitis: POSITIVE — AB
Comment: NEGATIVE
Comment: NEGATIVE
Comment: NEGATIVE

## 2023-06-18 MED ORDER — FLUCONAZOLE 150 MG PO TABS
ORAL_TABLET | ORAL | 1 refills | Status: DC
Start: 2023-06-18 — End: 2023-07-01

## 2023-06-18 MED ORDER — METRONIDAZOLE 0.75 % VA GEL
1.0000 | Freq: Every day | VAGINAL | 0 refills | Status: DC
Start: 2023-06-18 — End: 2023-07-01

## 2023-06-18 NOTE — Progress Notes (Signed)
+  BV and yeast on vaginal swab, will rx diflucan and metrogel, no sex while using gel.

## 2023-07-01 ENCOUNTER — Other Ambulatory Visit: Payer: Managed Care, Other (non HMO)

## 2023-07-01 ENCOUNTER — Other Ambulatory Visit (HOSPITAL_COMMUNITY)
Admission: RE | Admit: 2023-07-01 | Discharge: 2023-07-01 | Disposition: A | Discharge status: Home / Self Care | Payer: Managed Care, Other (non HMO) | Source: Ambulatory Visit | Attending: Obstetrics & Gynecology | Admitting: Obstetrics & Gynecology

## 2023-07-01 DIAGNOSIS — N898 Other specified noninflammatory disorders of vagina: Secondary | ICD-10-CM | POA: Insufficient documentation

## 2023-07-01 NOTE — Progress Notes (Signed)
   NURSE VISIT- VAGINITIS  SUBJECTIVE:  Kelly Watts is a 28 y.o. [id number] GYN patientfemale here for a vaginal swab for vaginitis screening.  She reports the following symptoms: local irritation and vulvar itching for 1 day. Denies abnormal vaginal bleeding, significant pelvic pain, fever, or UTI symptoms. Finished last course of medication as instructed. Wants IUD out as nothing else had changed.  OBJECTIVE:  There were no vitals taken for this visit.  Appears well, in no apparent distress  ASSESSMENT: Vaginal swab for vaginitis screening  PLAN: Self-collected vaginal probe for Bacterial Vaginosis, Yeast sent to lab Treatment: to be determined once results are received Follow-up: make appt for IUD removal if desired or if symptoms worsen  Jobe Marker  07/01/2023 11:45 AM

## 2023-07-02 LAB — CERVICOVAGINAL ANCILLARY ONLY
Bacterial Vaginitis (gardnerella): POSITIVE — AB
Candida Glabrata: NEGATIVE
Candida Vaginitis: NEGATIVE
Comment: NEGATIVE
Comment: NEGATIVE
Comment: NEGATIVE

## 2023-07-03 ENCOUNTER — Other Ambulatory Visit: Payer: Self-pay | Admitting: Adult Health

## 2023-07-03 MED ORDER — METRONIDAZOLE 0.75 % VA GEL
1.0000 | Freq: Every day | VAGINAL | 0 refills | Status: DC
Start: 2023-07-03 — End: 2023-08-03

## 2023-07-03 NOTE — Progress Notes (Signed)
Refilled metrogel 

## 2023-07-07 ENCOUNTER — Encounter: Payer: Self-pay | Admitting: Adult Health

## 2023-07-07 ENCOUNTER — Ambulatory Visit: Payer: Managed Care, Other (non HMO) | Admitting: Adult Health

## 2023-07-07 VITALS — BP 97/64 | HR 78 | Ht 63.0 in | Wt 139.5 lb

## 2023-07-07 DIAGNOSIS — Z30432 Encounter for removal of intrauterine contraceptive device: Secondary | ICD-10-CM | POA: Insufficient documentation

## 2023-07-07 DIAGNOSIS — Z3202 Encounter for pregnancy test, result negative: Secondary | ICD-10-CM | POA: Diagnosis not present

## 2023-07-07 DIAGNOSIS — Z30011 Encounter for initial prescription of contraceptive pills: Secondary | ICD-10-CM | POA: Diagnosis not present

## 2023-07-07 LAB — POCT URINE PREGNANCY: Preg Test, Ur: NEGATIVE

## 2023-07-07 MED ORDER — LO LOESTRIN FE 1 MG-10 MCG / 10 MCG PO TABS: 1.0000 | ORAL_TABLET | Freq: Every day | ORAL | 3 refills | Status: DC

## 2023-07-07 NOTE — Progress Notes (Addendum)
  Subjective:     Patient ID: Kelly Watts, female   DOB: 07-28-1995, 28 y.o.   MRN: [id number]  HPI Kelly Watts is a 28 year old white female, married, [id number] in for IUD removal, has had more BV.      Component Value Date/Time   DIAGPAP  10/24/2020 0856    - Negative for intraepithelial lesion or malignancy (NILM)   DIAGPAP  01/20/2017 0000    NEGATIVE FOR INTRAEPITHELIAL LESIONS OR MALIGNANCY.   HPVHIGH Negative 10/24/2020 0856   ADEQPAP  10/24/2020 0856    Satisfactory for evaluation; transformation zone component PRESENT.   ADEQPAP  01/20/2017 0000    Satisfactory for evaluation  endocervical/transformation zone component PRESENT.   PCP is Kurtis Bushman FNP   Review of Systems For IUD removal Has had more BV Reviewed past medical,surgical, social and family history. Reviewed medications and allergies.     Objective:   Physical Exam BP 97/64 (BP Location: Left Arm, Patient Position: Sitting, Cuff Size: Normal)   Pulse 78   Ht 5\' 3"  (1.6 m)   Wt 139 lb 8 oz (63.3 kg)   LMP 06/23/2023 (Approximate)   Breastfeeding Yes   BMI 24.71 kg/m  UPT is negative. Consent signed and time out called.  Skin warm and dry.Pelvic: external genitalia is normal in appearance no lesions, vagina: mucous discharge,urethra has no lesions or masses noted, cervix is bulbous,IUD strings at os, strings grasped with forceps and pt asked to cough and IUD easily removed, uterus: normal size, shape and contour, non tender, no masses felt, adnexa: no masses or tenderness noted. Bladder is non tender and no masses felt.   Upstream - 07/07/23 0925       Pregnancy Intention Screening   Does the patient want to become pregnant in the next year? No    Does the patient's partner want to become pregnant in the next year? No    Would the patient like to discuss contraceptive options today? Yes      Contraception Wrap Up   Current Method IUD or IUS    End Method Oral Contraceptive;Female Condom     Contraception Counseling Provided Yes    How was the end contraceptive method provided? Prescription   3 pk given to start           Examination chaperoned by Faith Rogue LPN    Assessment:     1. Pregnancy examination or test, negative result - POCT urine pregnancy  2. Encounter for IUD removal (Primary) IUD easily removed  3. Encounter for initial prescription of contraceptive pills Denies MI,stroke,breast cancer,DVT, or migraine with aura She wants pills, will try lo loestrin Gave 3 packs start today and use condoms for 1 pack, rx sent Meds ordered this encounter  Medications   Norethindrone-Ethinyl Estradiol-Fe Biphas (LO LOESTRIN FE) 1 MG-10 MCG / 10 MCG tablet    Sig: Take 1 tablet by mouth daily. Take 1 daily by mouth    Dispense:  84 tablet    Refill:  3    BIN [id number], PCN [id number], GRP [id number],QV [id number]    Supervising Provider:   Duane Lope H [[id number]]   Follow up in 3 months for ROS  If has decrease in milk, let me know     Plan:     Follow up in 3 months for ROS

## 2023-07-20 ENCOUNTER — Ambulatory Visit: Payer: Managed Care, Other (non HMO) | Admitting: Urology

## 2023-08-03 ENCOUNTER — Other Ambulatory Visit: Payer: Self-pay | Admitting: Family Medicine

## 2023-08-03 ENCOUNTER — Ambulatory Visit: Admitting: Family Medicine

## 2023-08-03 ENCOUNTER — Encounter: Payer: Self-pay | Admitting: Family Medicine

## 2023-08-03 VITALS — BP 118/72 | HR 80 | Temp 98.5°F | Ht 63.0 in | Wt 141.0 lb

## 2023-08-03 DIAGNOSIS — R42 Dizziness and giddiness: Secondary | ICD-10-CM | POA: Diagnosis not present

## 2023-08-03 MED ORDER — EPINEPHRINE 0.3 MG/0.3ML IJ SOAJ: 0.3000 mg | INTRAMUSCULAR | 1 refills | Status: DC | PRN

## 2023-08-03 NOTE — Assessment & Plan Note (Signed)
 EKG NSR. Will obtain CBC, CMP. Negative Dix Hallpike and orthostatic BPs. No red flags on exam. Encouraged to stay well hydrated while breastfeeding and consume iron rich foods.

## 2023-08-03 NOTE — Progress Notes (Signed)
 Subjective:  HPI: Kelly Watts is a 28 y.o. female presenting on 08/03/2023 for Dizziness (Pt states she has cut out red meat due to being allergic to it. She has been feeling dizzy and thinks her iron might be low. )   Dizziness   Patient is in today for dizziness since stopping eating red meat 2 months ago. This occurs daily. It is not associated with other symptoms and she cannot identify any precipitating or relieving factors. Described as lightheadedness with occasional spinning. It is not positional or worse with standing. No presyncope. Denies SOB, chest pain, vision changes, imbalance. She is having light menstrual cycles described as spotting due to continuing to nurse her 64 month old. No history of IDA or anemia. Recently eliminated red meat from her diet due to alpha gal.   Review of Systems  Neurological:  Positive for dizziness.  All other systems reviewed and are negative.   Relevant past medical history reviewed and updated as indicated.   Past Medical History:  Diagnosis Date   Angio-edema    Asthma    Eczema    Encounter for Nexplanon removal 11/16/2015   History of kidney stones    History of kidney stones    Kidney infection    Miscarriage    Nexplanon insertion 11/02/2012   Inserted left arm 11/02/12   Patient underweight    Recurrent upper respiratory infection (URI)    Urticaria      Past Surgical History:  Procedure Laterality Date   DILATION AND CURETTAGE OF UTERUS N/A 08/06/2021   Procedure: SUCTION DILATATION AND CURETTAGE;  Surgeon: Myna Hidalgo, DO;  Location: [facility];  Service: Gynecology;  Laterality: N/A;  pt knows to be NPO, will arrive at 10:00 - no PAT   EAR TUBE REMOVAL     EXTRACORPOREAL SHOCK WAVE LITHOTRIPSY Left 10/30/2016   Procedure: LEFT EXTRACORPOREAL SHOCK WAVE LITHOTRIPSY (ESWL);  Surgeon: Crist Fat, MD;  Location: [facility];  Service: Urology;  Laterality: Left;   TYMPANOSTOMY TUBE PLACEMENT     WISDOM TOOTH  EXTRACTION      Allergies and medications reviewed and updated.   Current Outpatient Medications:    EPINEPHrine 0.3 mg/0.3 mL IJ SOAJ injection, Inject 0.3 mg into the muscle as needed for anaphylaxis., Disp: 2 each, Rfl: 1   Norethindrone-Ethinyl Estradiol-Fe Biphas (LO LOESTRIN FE) 1 MG-10 MCG / 10 MCG tablet, Take 1 tablet by mouth daily. Take 1 daily by mouth, Disp: 84 tablet, Rfl: 3   Prenatal Vit-Fe Fumarate-FA (PRENATAL VITAMIN PO), Take 1 tablet by mouth daily., Disp: , Rfl:   Allergies  Allergen Reactions   Penicillins Hives   Sulfa Antibiotics Hives    Objective:   BP 118/72   Pulse 80   Temp 98.5 F (36.9 C)   Ht 5\' 3"  (1.6 m)   Wt 141 lb (64 kg)   LMP 08/03/2023 (Exact Date)   SpO2 99%   Breastfeeding Yes   BMI 24.98 kg/m      08/03/2023   11:26 AM 07/07/2023    9:21 AM 04/12/2023   12:19 PM  Vitals with BMI  Height 5\' 3"  5\' 3"    Weight 141 lbs 139 lbs 8 oz   BMI 24.98 24.72   Systolic 118 97 121  Diastolic 72 64 80  Pulse 80 78 117     Physical Exam Vitals and nursing note reviewed.  Constitutional:      Appearance: Normal appearance. She is normal weight.  HENT:  Head: Normocephalic and atraumatic.  Cardiovascular:     Rate and Rhythm: Normal rate and regular rhythm.     Pulses: Normal pulses.     Heart sounds: Normal heart sounds.  Pulmonary:     Effort: Pulmonary effort is normal.     Breath sounds: Normal breath sounds.  Skin:    General: Skin is warm and dry.  Neurological:     General: No focal deficit present.     Mental Status: She is alert and oriented to person, place, and time. Mental status is at baseline.  Psychiatric:        Mood and Affect: Mood normal.        Behavior: Behavior normal.        Thought Content: Thought content normal.        Judgment: Judgment normal.     Assessment & Plan:  Intermittent lightheadedness Assessment & Plan: EKG NSR. Will obtain CBC, CMP. Negative Dix Hallpike and orthostatic BPs. No  red flags on exam. Encouraged to stay well hydrated while breastfeeding and consume iron rich foods.   Dizziness -     CBC with Differential/Platelet -     COMPLETE METABOLIC PANEL WITHOUT GFR -     EKG 12-Lead  Other orders -     EPINEPHrine; Inject 0.3 mg into the muscle as needed for anaphylaxis.  Dispense: 2 each; Refill: 1     Follow up plan: Return if symptoms worsen or fail to improve.  Park Meo, FNP

## 2023-08-04 ENCOUNTER — Other Ambulatory Visit: Payer: Self-pay | Admitting: Family Medicine

## 2023-08-04 ENCOUNTER — Encounter: Payer: Self-pay | Admitting: Family Medicine

## 2023-08-04 LAB — CBC WITH DIFFERENTIAL/PLATELET
Absolute Lymphocytes: 2738 {cells}/uL (ref 850–3900)
Absolute Monocytes: 584 {cells}/uL (ref 200–950)
Basophils Absolute: 29 {cells}/uL (ref 0–200)
Basophils Relative: 0.4 %
Eosinophils Absolute: 80 {cells}/uL (ref 15–500)
Eosinophils Relative: 1.1 %
HCT: 40.3 % (ref 35.0–45.0)
Hemoglobin: 13.5 g/dL (ref 11.7–15.5)
MCH: 29.7 pg (ref 27.0–33.0)
MCHC: 33.5 g/dL (ref 32.0–36.0)
MCV: 88.6 fL (ref 80.0–100.0)
MPV: 10.3 fL (ref 7.5–12.5)
Monocytes Relative: 8 %
Neutro Abs: 3869 {cells}/uL (ref 1500–7800)
Neutrophils Relative %: 53 %
Platelets: 328 10*3/uL (ref 140–400)
RBC: 4.55 10*6/uL (ref 3.80–5.10)
RDW: 12 % (ref 11.0–15.0)
Total Lymphocyte: 37.5 %
WBC: 7.3 10*3/uL (ref 3.8–10.8)

## 2023-08-04 LAB — COMPLETE METABOLIC PANEL WITHOUT GFR
AG Ratio: 1.5 (calc) (ref 1.0–2.5)
ALT: 9 U/L (ref 6–29)
AST: 12 U/L (ref 10–30)
Albumin: 4.3 g/dL (ref 3.6–5.1)
Alkaline phosphatase (APISO): 83 U/L (ref 31–125)
BUN: 14 mg/dL (ref 7–25)
CO2: 27 mmol/L (ref 20–32)
Calcium: 8.9 mg/dL (ref 8.6–10.2)
Chloride: 104 mmol/L (ref 98–110)
Creat: 0.65 mg/dL (ref 0.50–0.96)
Globulin: 2.9 g/dL (ref 1.9–3.7)
Glucose, Bld: 76 mg/dL (ref 65–99)
Potassium: 3.9 mmol/L (ref 3.5–5.3)
Sodium: 140 mmol/L (ref 135–146)
Total Bilirubin: 0.4 mg/dL (ref 0.2–1.2)
Total Protein: 7.2 g/dL (ref 6.1–8.1)

## 2023-08-05 ENCOUNTER — Other Ambulatory Visit: Payer: Self-pay | Admitting: Family Medicine

## 2023-08-05 MED ORDER — EPINEPHRINE 0.3 MG/0.3ML IJ SOAJ: 0.3000 mg | INTRAMUSCULAR | 1 refills | Status: AC | PRN

## 2023-09-15 ENCOUNTER — Ambulatory Visit: Admitting: Adult Health

## 2023-09-17 ENCOUNTER — Other Ambulatory Visit: Payer: Self-pay | Admitting: Adult Health

## 2023-09-17 MED ORDER — NORETHIN ACE-ETH ESTRAD-FE 1-20 MG-MCG(24) PO TABS
1.0000 | ORAL_TABLET | Freq: Every day | ORAL | 11 refills | Status: DC
Start: 2023-09-17 — End: 2023-10-07

## 2023-09-17 NOTE — Progress Notes (Signed)
 Rx Loestrin  24 fe lo Loestrin  not covered

## 2023-09-22 ENCOUNTER — Ambulatory Visit: Admitting: Family Medicine

## 2023-09-22 ENCOUNTER — Encounter: Payer: Self-pay | Admitting: Family Medicine

## 2023-09-22 VITALS — BP 112/60 | HR 116 | Temp 98.6°F | Ht 63.0 in | Wt 141.2 lb

## 2023-09-22 DIAGNOSIS — R6889 Other general symptoms and signs: Secondary | ICD-10-CM

## 2023-09-22 DIAGNOSIS — J029 Acute pharyngitis, unspecified: Secondary | ICD-10-CM | POA: Diagnosis not present

## 2023-09-22 LAB — INFLUENZA A AND B AG, IMMUNOASSAY
INFLUENZA A ANTIGEN: NOT DETECTED
INFLUENZA B ANTIGEN: NOT DETECTED

## 2023-09-22 NOTE — Progress Notes (Signed)
 Patient Office Visit  Assessment & Plan:   Sore throat -     STREP GROUP A AG, W/REFLEX TO CULT -     SARS-CoV-2 RNA, Influenza A/B, and RSV RNA, Qualitative NAAT  Flu-like symptoms -     Influenza A and B Ag, Immunoassay -     SARS-CoV-2 RNA, Influenza A/B, and RSV RNA, Qualitative NAAT  Other orders -     Culture, Group A Strep   Assessment and Plan    Acute pharyngitis Likely viral etiology due to absence of fever and exposure to a sick child. - Await strep and flu test results.  Body aches Likely viral origin concurrent with sore throat and exposure to illness. - Recommend Advil  for symptom relief.      Strep negative, negative flu test Can try OTC Claritin.  Return if symptoms worsen or fail to improve.   Subjective:     Patient ID: Kelly Watts, female    DOB: 11/13/1995  Age: 28 y.o. MRN: [id number]  Chief Complaint  Patient presents with   Sore Throat    Started this morning around 9am. Scratchy feeling, hurts to swallow.    Generalized Body Aches    Started yesterday.    Sore Throat    Discussed the use of AI scribe software for clinical note transcription with the patient, who gave verbal consent to proceed.  History of Present Illness   Kelly Watts is a 28 year old female who presents with sore throat and body aches.  She began experiencing body aches yesterday, followed by the onset of a sore throat this morning. She attributes some of her symptoms to her ten-month-old daughter being unwell and sleeping in her bed. Her husband and two other daughters are not currently sick.  She experiences chills but does not feel feverish. No cough, wheezing, nausea, vomiting, or headaches. Her appetite remains good. She took Advil  last night, which helped alleviate her symptoms, but has not taken any medication today. She denies taking any over-the-counter medications like Sudafed.  She has a history of chronic ear infections as a child and had  tympanostomy tubes placed in both ears. She reports that her sinuses sometimes cause facial pain, but she is not experiencing that currently.  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 She is a former smoker.      Physical Exam HEENT: Right tympanic membrane appears worse than left. Throat swollen, no white spots, drainage present. Results Assessment & Plan Acute pharyngitis Likely viral etiology due to absence of fever and exposure to a sick child. - Await strep and flu test results. Negative result- pt can take OTC Claritin to help with postnasal drip and congestion (pt is currently breastfeeding)  Body aches Likely viral origin concurrent with sore throat and exposure to illness. - Recommend Advil  for symptom relief. Will check for COVID/RSV (negative strep and flu)    The ASCVD Risk score (Arnett DK, et al., 2019) failed to calculate for the following reasons:   The 2019 ASCVD risk score is only valid for ages 52 to 69  Past Medical History:  Diagnosis Date   Angio-edema    Asthma    Eczema    Encounter for Nexplanon  removal 11/16/2015   History of kidney stones    History of kidney stones    Kidney infection    Miscarriage    Nexplanon  insertion 11/02/2012   Inserted left arm 11/02/12   Patient underweight  Recurrent upper respiratory infection (URI)    Urticaria    Past Surgical History:  Procedure Laterality Date   DILATION AND CURETTAGE OF UTERUS N/A 08/06/2021   Procedure: SUCTION DILATATION AND CURETTAGE;  Surgeon: Ozan, Jennifer, DO;  Location: [facility];  Service: Gynecology;  Laterality: N/A;  pt knows to be NPO, will arrive at 10:00 - no PAT   EAR TUBE REMOVAL     EXTRACORPOREAL SHOCK WAVE LITHOTRIPSY Left 10/30/2016   Procedure: LEFT EXTRACORPOREAL SHOCK WAVE LITHOTRIPSY (ESWL);  Surgeon: Andrez Banker, MD;  Location: [facility];  Service: Urology;  Laterality: Left;   TYMPANOSTOMY TUBE PLACEMENT     WISDOM TOOTH EXTRACTION     Social History    Tobacco Use   Smoking status: Former    Current packs/day: 0.00    Average packs/day: 0.3 packs/day for 3.0 years (0.8 ttl pk-yrs)    Types: Cigarettes    Start date: 01/29/2013    Quit date: 01/30/2016    Years since quitting: 7.6   Smokeless tobacco: Never  Vaping Use   Vaping status: Never Used  Substance Use Topics   Alcohol use: Not Currently    Comment: occ   Drug use: No   Family History  Problem Relation Age of Onset   Coronary artery disease Paternal Grandfather    Cancer Paternal Grandmother        breast   Diabetes Maternal Grandmother    Thyroid  disease Maternal Grandmother    Heart disease Maternal Grandmother    Melanoma Maternal Grandfather    Allergic rhinitis Father    Asthma Mother    Allergic rhinitis Mother    Allergic rhinitis Brother    Allergic rhinitis Brother    Angioedema Neg Hx    Atopy Neg Hx    Eczema Neg Hx    Immunodeficiency Neg Hx    Urticaria Neg Hx    Allergies  Allergen Reactions   Penicillins Hives   Sulfa Antibiotics Hives    ROS    Objective:    BP 112/60   Pulse (!) 116   Temp 98.6 F (37 C)   Ht 5\' 3"  (1.6 m)   Wt 141 lb 4 oz (64.1 kg)   SpO2 98%   BMI 25.02 kg/m  BP Readings from Last 3 Encounters:  09/22/23 112/60  08/03/23 118/72  07/07/23 97/64   Wt Readings from Last 3 Encounters:  09/22/23 141 lb 4 oz (64.1 kg)  08/03/23 141 lb (64 kg)  07/07/23 139 lb 8 oz (63.3 kg)    Physical Exam Vitals and nursing note reviewed.  Constitutional:      Appearance: Normal appearance.  HENT:     Head: Normocephalic.     Right Ear: Tympanic membrane, ear canal and external ear normal. Tympanic membrane is not erythematous.     Left Ear: Tympanic membrane, ear canal and external ear normal. Tympanic membrane is not erythematous.     Nose: Congestion present.     Right Sinus: No maxillary sinus tenderness.     Left Sinus: No maxillary sinus tenderness.     Mouth/Throat:     Pharynx: Postnasal drip present. No  oropharyngeal exudate or posterior oropharyngeal erythema.     Comments: Postnasal drip Eyes:     Extraocular Movements: Extraocular movements intact.     Pupils: Pupils are equal, round, and reactive to light.  Cardiovascular:     Rate and Rhythm: Regular rhythm. Tachycardia present.     Heart sounds: Normal heart  sounds.  Pulmonary:     Effort: Pulmonary effort is normal.     Breath sounds: Normal breath sounds. No wheezing or rhonchi.  Neurological:     General: No focal deficit present.     Mental Status: She is alert and oriented to person, place, and time.      Results for orders placed or performed in visit on 09/22/23  STREP GROUP A AG, W/REFLEX TO CULT   Specimen: Throat  Result Value Ref Range   Streptococcus Group A AG NOT DETECTED NOT DETECTED  Influenza A and B Ag, Immunoassay  Result Value Ref Range   Source: NASAL    INFLUENZA A ANTIGEN NOT DETECTED NOT DETECTED   INFLUENZA B ANTIGEN NOT DETECTED NOT DETECTED

## 2023-09-24 LAB — CULTURE, GROUP A STREP
Micro Number: 16478294
SPECIMEN QUALITY:: ADEQUATE

## 2023-09-24 LAB — SARS-COV-2 RNA, INFLUENZA A/B, AND RSV RNA, QUALITATIVE NAAT
INFLUENZA A RNA: NOT DETECTED
INFLUENZA B RNA: NOT DETECTED
RSV RNA: NOT DETECTED
SARS COV2 RNA: NOT DETECTED

## 2023-09-24 LAB — STREP GROUP A AG, W/REFLEX TO CULT: Streptococcus Group A AG: NOT DETECTED

## 2023-09-25 ENCOUNTER — Ambulatory Visit: Payer: Self-pay | Admitting: Family Medicine

## 2023-10-07 ENCOUNTER — Encounter: Payer: Self-pay | Admitting: Adult Health

## 2023-10-07 ENCOUNTER — Ambulatory Visit: Admitting: Adult Health

## 2023-10-07 VITALS — BP 105/70 | HR 72 | Ht 63.0 in | Wt 140.0 lb

## 2023-10-07 DIAGNOSIS — Z3041 Encounter for surveillance of contraceptive pills: Secondary | ICD-10-CM | POA: Diagnosis not present

## 2023-10-07 NOTE — Progress Notes (Signed)
  Subjective:     Patient ID: Kelly Watts, female   DOB: 1995-09-27, 28 y.o.   MRN: [id number]  HPI Kelly Watts is a 28 year old white female, married, [id number] back in follow up after having IUD removed and started on BCP, and she likes the pill much better.     Component Value Date/Time   DIAGPAP  10/24/2020 0856    - Negative for intraepithelial lesion or malignancy (NILM)   DIAGPAP  01/20/2017 0000    NEGATIVE FOR INTRAEPITHELIAL LESIONS OR MALIGNANCY.   HPVHIGH Negative 10/24/2020 0856   ADEQPAP  10/24/2020 0856    Satisfactory for evaluation; transformation zone component PRESENT.   ADEQPAP  01/20/2017 0000    Satisfactory for evaluation  endocervical/transformation zone component PRESENT.    PCP is Yolanda Hence FNP Review of Systems Periods are closer together, like about 2 weeks from end to beginning She likes the pill much better than IUD No change in breast milk  Reviewed past medical,surgical, social and family history. Reviewed medications and allergies.     Objective:   Physical Exam BP 105/70 (BP Location: Left Arm, Patient Position: Sitting, Cuff Size: Normal)   Pulse 72   Ht 5\' 3"  (1.6 m)   Wt 140 lb (63.5 kg)   Breastfeeding Yes   BMI 24.80 kg/m   Skin warm and dry.  Lungs: clear to ausculation bilaterally. Cardiovascular: regular rate and rhythm.      Upstream - 10/07/23 1004       Pregnancy Intention Screening   Does the patient want to become pregnant in the next year? No    Does the patient's partner want to become pregnant in the next year? No    Would the patient like to discuss contraceptive options today? No      Contraception Wrap Up   Current Method Oral Contraceptive    End Method Oral Contraceptive    Contraception Counseling Provided Yes             Assessment:     1. Encounter for surveillance of contraceptive pills (Primary) Has refills on BCP, Loestrin  24 fe, but may have been given Hailey     Plan:     Follow up in 3  months for pap and physical

## 2023-12-18 ENCOUNTER — Ambulatory Visit: Admitting: Family Medicine

## 2023-12-18 ENCOUNTER — Encounter: Payer: Self-pay | Admitting: Family Medicine

## 2023-12-18 VITALS — BP 108/64 | HR 88 | Temp 98.3°F | Ht 63.0 in | Wt 138.6 lb

## 2023-12-18 DIAGNOSIS — N6012 Diffuse cystic mastopathy of left breast: Secondary | ICD-10-CM | POA: Diagnosis not present

## 2023-12-18 DIAGNOSIS — N644 Mastodynia: Secondary | ICD-10-CM

## 2023-12-18 DIAGNOSIS — N6011 Diffuse cystic mastopathy of right breast: Secondary | ICD-10-CM | POA: Diagnosis not present

## 2023-12-18 NOTE — Progress Notes (Signed)
 Patient Office Visit  Assessment & Plan:  Breast pain, left  Fibrocystic breast changes, bilateral   Assessment and Plan    Fibrocystic breast changes with left greater than right breast pain following recent cessation of breastfeeding Intermittent sharp left breast pain post-breastfeeding cessation. Fibrocystic changes noted. No immediate malignancy concern. Imaging deferred due to lactation effects. - Use Voltaren gel for breast discomfort as needed. - Monitor symptoms and report if no improvement. - Follow up with OBGYN on September 4th for further evaluation. - Consider imaging if symptoms persist or worsen.          No follow-ups on file.   Subjective:    Patient ID: Kelly Watts, female    DOB: 06-Nov-1995  Age: 28 y.o. MRN: [id number]  Chief Complaint  Patient presents with   Breast Pain    Pt recently stopped breastfeeding. She c/o of L breast pain. She states she does still have a milk supply.     HPI Discussed the use of AI scribe software for clinical note transcription with the patient, who gave verbal consent to proceed.  History of Present Illness        Kelly Watts is a 28 year old female who presents with left breast pain after stopping breastfeeding. She has noticed that the right breast feels lumpy too.   She experiences left breast pain described as a 'shocking sensation' that occurs intermittently, lasting from a second to five seconds. The pain began after she stopped breastfeeding at the beginning of August. Initially, the pain started in the nipple and then moved into the breast tissue. No bleeding or engorgement is noted, but she is still producing a very small amount of milk when expressing.  She stopped breastfeeding gradually, with the last nursing session on August 1st. Her child, now a year old, primarily consumes breast milk from a stored supply of over 700 ounces in the freezer. She is not currently breastfeeding directly.  She is  on birth control and does not take any other medications. She has not used any pain relief measures such as Tylenol  or Advil  for the breast pain.  Family history is significant for her paternal grandmother having breast cancer, which the patient recalls was in her milk duct. Her mother recently had a normal mammogram.  No issues with vision, double vision, or loss of vision. No spontaneous discharge from the right breast and she is unsure if there is any milk production from the right side. 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 Physical Exam BREAST: Breasts are fibrocystic with tenderness and lumpiness, but no palpable mass. Results Assessment & Plan Fibrocystic breast changes with left greater than right breast pain following recent cessation of breastfeeding Intermittent sharp left breast pain post-breastfeeding cessation. Fibrocystic changes noted. No immediate malignancy concern. Imaging deferred due to lactation effects. - Use Voltaren gel for breast discomfort as needed. - Monitor symptoms and report if no improvement. - Follow up with OBGYN on September 4th for further evaluation. - Consider imaging if symptoms persist or worsen.    The ASCVD Risk score (Arnett DK, et al., 2019) failed to calculate for the following reasons:   The 2019 ASCVD risk score is only valid for ages 71 to 51  Past Medical History:  Diagnosis Date   Angio-edema    Asthma    Eczema    Encounter for Nexplanon  removal 11/16/2015   History of kidney stones    History of kidney stones  Kidney infection    Miscarriage    Nexplanon  insertion 11/02/2012   Inserted left arm 11/02/12   Patient underweight    Recurrent upper respiratory infection (URI)    Urticaria    Past Surgical History:  Procedure Laterality Date   DILATION AND CURETTAGE OF UTERUS N/A 08/06/2021   Procedure: SUCTION DILATATION AND CURETTAGE;  Surgeon: Ozan, Jennifer, DO;  Location: [facility];  Service: Gynecology;  Laterality:  N/A;  pt knows to be NPO, will arrive at 10:00 - no PAT   EAR TUBE REMOVAL     EXTRACORPOREAL SHOCK WAVE LITHOTRIPSY Left 10/30/2016   Procedure: LEFT EXTRACORPOREAL SHOCK WAVE LITHOTRIPSY (ESWL);  Surgeon: Cam Morene ORN, MD;  Location: [facility];  Service: Urology;  Laterality: Left;   TYMPANOSTOMY TUBE PLACEMENT     WISDOM TOOTH EXTRACTION     Social History   Tobacco Use   Smoking status: Former    Current packs/day: 0.00    Average packs/day: 0.3 packs/day for 3.0 years (0.8 ttl pk-yrs)    Types: Cigarettes    Start date: 01/29/2013    Quit date: 01/30/2016    Years since quitting: 7.8   Smokeless tobacco: Never  Vaping Use   Vaping status: Never Used  Substance Use Topics   Alcohol use: Not Currently    Comment: occ   Drug use: No   Family History  Problem Relation Age of Onset   Asthma Mother    Allergic rhinitis Mother    Allergic rhinitis Father    Allergic rhinitis Brother    Allergic rhinitis Brother    Diabetes Maternal Grandmother    Thyroid  disease Maternal Grandmother    Heart disease Maternal Grandmother    Melanoma Maternal Grandfather    Breast cancer Paternal Grandmother        breast   Coronary artery disease Paternal Grandfather    Angioedema Neg Hx    Atopy Neg Hx    Eczema Neg Hx    Immunodeficiency Neg Hx    Urticaria Neg Hx    Allergies  Allergen Reactions   Penicillins Hives   Sulfa Antibiotics Hives    ROS    Objective:    BP 108/64   Pulse 88   Temp 98.3 F (36.8 C)   Ht 5' 3 (1.6 m)   Wt 138 lb 9 oz (62.9 kg)   LMP 12/11/2023 (Exact Date)   SpO2 99%   Breastfeeding No Comment: Recently stopped breastfeeding around the first of August.  BMI 24.55 kg/m  BP Readings from Last 3 Encounters:  12/18/23 108/64  10/07/23 105/70  09/22/23 112/60   Wt Readings from Last 3 Encounters:  12/18/23 138 lb 9 oz (62.9 kg)  10/07/23 140 lb (63.5 kg)  09/22/23 141 lb 4 oz (64.1 kg)    Physical Exam Vitals and nursing note  reviewed.  Constitutional:      Appearance: Normal appearance.  HENT:     Head: Normocephalic.     Right Ear: Tympanic membrane, ear canal and external ear normal.     Left Ear: Tympanic membrane, ear canal and external ear normal.  Eyes:     Extraocular Movements: Extraocular movements intact.     Pupils: Pupils are equal, round, and reactive to light.  Cardiovascular:     Rate and Rhythm: Normal rate and regular rhythm.     Heart sounds: Normal heart sounds.  Pulmonary:     Effort: Pulmonary effort is normal.     Breath sounds: Normal  breath sounds.  Chest:  Breasts:    Right: Tenderness present. No swelling, mass or nipple discharge.     Left: No swelling, mass, nipple discharge or tenderness.     Comments: Breasts-fibrocystic changes noted bilaterally right breast is worse.  Patient does have some tenderness at approximately 10:00 o'clock on the right breast.  No nipple dimpling nipple discharge.  No palpable masses noted. Musculoskeletal:     Right lower leg: No edema.     Left lower leg: No edema.  Neurological:     General: No focal deficit present.     Mental Status: She is alert and oriented to person, place, and time.  Psychiatric:        Behavior: Behavior normal.      No results found for any visits on 12/18/23.

## 2024-01-07 ENCOUNTER — Encounter: Payer: Self-pay | Admitting: Adult Health

## 2024-01-07 ENCOUNTER — Other Ambulatory Visit (HOSPITAL_COMMUNITY)
Admission: RE | Admit: 2024-01-07 | Discharge: 2024-01-07 | Disposition: A | Discharge status: Home / Self Care | Source: Ambulatory Visit | Attending: Adult Health | Admitting: Adult Health

## 2024-01-07 ENCOUNTER — Ambulatory Visit: Admitting: Adult Health

## 2024-01-07 VITALS — BP 110/74 | HR 99 | Ht 63.0 in | Wt 141.5 lb

## 2024-01-07 DIAGNOSIS — K429 Umbilical hernia without obstruction or gangrene: Secondary | ICD-10-CM

## 2024-01-07 DIAGNOSIS — Z01419 Encounter for gynecological examination (general) (routine) without abnormal findings: Secondary | ICD-10-CM | POA: Insufficient documentation

## 2024-01-07 DIAGNOSIS — Z3202 Encounter for pregnancy test, result negative: Secondary | ICD-10-CM | POA: Diagnosis not present

## 2024-01-07 DIAGNOSIS — N6311 Unspecified lump in the right breast, upper outer quadrant: Secondary | ICD-10-CM

## 2024-01-07 DIAGNOSIS — Z3041 Encounter for surveillance of contraceptive pills: Secondary | ICD-10-CM

## 2024-01-07 LAB — POCT URINE PREGNANCY: Preg Test, Ur: NEGATIVE

## 2024-01-07 MED ORDER — HAILEY 24 FE 1-20 MG-MCG(24) PO TABS: 1.0000 | ORAL_TABLET | Freq: Every day | ORAL | 4 refills | Status: DC

## 2024-01-07 NOTE — Progress Notes (Signed)
 Patient ID: Charmaine JINNY Dustman, female   DOB: 04-24-1996, 28 y.o.   MRN: [id number] History of Present Illness: Sanaii is a 28 year old white female, married, [id number] in for a well woman gyn exam and pap. She has noticed area right breast that is tender, was seen at PCP, in August about it, felt to be fibroglandular tissue, has stopped breast feeding end of July. Period is 3 days late, had been bleeding every 2 weeks before that.  PCP is DELENA Barrio NP    Current Medications, Allergies, Past Medical History, Past Surgical History, Family History and Social History were reviewed in [employer] record.     Review of Systems: Patient denies any headaches, hearing loss, fatigue, blurred vision, shortness of breath, chest pain, abdominal pain, problems with bowel movements, urination, or intercourse. No joint pain or mood swings.  See HPI for positives   Physical Exam:BP 110/74 (BP Location: Right Arm, Patient Position: Sitting, Cuff Size: Normal)   Pulse 99   Ht 5' 3 (1.6 m)   Wt 141 lb 8 oz (64.2 kg)   LMP 12/11/2023 (Exact Date)   Breastfeeding No   BMI 25.07 kg/m  UPT is negative  General:  Well developed, well nourished, no acute distress Skin:  Warm and dry Neck:  Midline trachea, normal thyroid , good ROM, no lymphadenopathy Lungs; Clear to auscultation bilaterally Breast:  No dominant palpable mass, retraction, or nipple discharge, on the left, on the right no retraction or nipple discharge, there is a 5 mm tender mass at about 11 0'clock, 2 FB from areola in area of fibroglandular tissue. Cardiovascular: Regular rate and rhythm Abdomen:  Soft, non tender, no hepatosplenomegaly, has small umbilical hernia, non tender Pelvic:  External genitalia is normal in appearance, no lesions.  The vagina is normal in appearance. Urethra has no lesions or masses. The cervix is bulbous,pap with HR HPV genotyping performed.  Uterus is felt to be normal size, shape, and contour.   No adnexal masses or tenderness noted.Bladder is non tender, no masses felt. Extremities/musculoskeletal:  No swelling or varicosities noted, no clubbing or cyanosis Psych:  No mood changes, alert and cooperative,seems happy AA is 2 Fall risk is low    01/07/2024    9:41 AM 12/18/2023   10:53 AM 08/03/2023   11:30 AM  Depression screen PHQ 2/9  Decreased Interest 0 0 0  Down, Depressed, Hopeless 0 0 0  PHQ - 2 Score 0 0 0  Altered sleeping 0 0 0  Tired, decreased energy 0 0 0  Change in appetite 0 0 0  Feeling bad or failure about yourself  0 0 0  Trouble concentrating 0 0 0  Moving slowly or fidgety/restless 0 0 0  Suicidal thoughts 0 0 0  PHQ-9 Score 0 0 0  Difficult doing work/chores  Not difficult at all Not difficult at all       01/07/2024    9:42 AM 12/18/2023   10:53 AM 08/03/2023   11:31 AM 04/17/2022    2:39 PM  GAD 7 : Generalized Anxiety Score  Nervous, Anxious, on Edge 0 0 0 0  Control/stop worrying 0 0 0 0  Worry too much - different things 0 0 0 0  Trouble relaxing 0 0 0 0  Restless 0 0 0 0  Easily annoyed or irritable 0 0 0 0  Afraid - awful might happen 0 0 0 0  Total GAD 7 Score 0 0 0 0  Anxiety  Difficulty  Not difficult at all Not difficult at all     Upstream - 01/07/24 0948       Pregnancy Intention Screening   Does the patient want to become pregnant in the next year? No    Does the patient's partner want to become pregnant in the next year? No    Would the patient like to discuss contraceptive options today? No      Contraception Wrap Up   Current Method Oral Contraceptive    End Method Oral Contraceptive    Contraception Counseling Provided Yes           Examination chaperoned by Clarita Salt LPN  Impression and plan: 1. Negative pregnancy test - POCT urine pregnancy  2. Encounter for gynecological examination with Papanicolaou smear of cervix (Primary) Pap sent Pap in 3 years if negative Physical in 1 year  - Cytology - PAP( [facility])  3. Mass of upper outer quadrant of right breast +5 mm tender mass at about 11 0'clock, 2 FB from areola in area of fibroglandular tissue. Will get right breast US  at [facility] 01/19/24 at 11:40 am to assess - US  LIMITED ULTRASOUND INCLUDING AXILLA RIGHT BREAST; Future  4. Encounter for surveillance of contraceptive pills Happy with pills, will refill  Meds ordered this encounter  Medications   Norethindrone  Acetate-Ethinyl Estrad-FE (HAILEY  24 FE) 1-20 MG-MCG(24) tablet    Sig: Take 1 tablet by mouth daily.    Dispense:  84 tablet    Refill:  4    Supervising Provider:   JAYNE, LUTHER H [[id number]]     5. Umbilical hernia without obstruction and without gangrene It is small and non tender, if ever has pain or it feels hard, go to ER Review handout

## 2024-01-11 ENCOUNTER — Ambulatory Visit: Payer: Self-pay | Admitting: Adult Health

## 2024-01-11 LAB — CYTOLOGY - PAP
Comment: NEGATIVE
Diagnosis: NEGATIVE
High risk HPV: NEGATIVE

## 2024-01-14 ENCOUNTER — Other Ambulatory Visit: Payer: Self-pay | Admitting: Adult Health

## 2024-01-14 DIAGNOSIS — R102 Pelvic and perineal pain: Secondary | ICD-10-CM

## 2024-01-14 NOTE — Progress Notes (Signed)
 Still have pain will get US  9/12/at 1:30 pm at [facility]

## 2024-01-15 ENCOUNTER — Ambulatory Visit (HOSPITAL_COMMUNITY)
Admission: RE | Admit: 2024-01-15 | Discharge: 2024-01-15 | Disposition: A | Discharge status: Home / Self Care | Source: Ambulatory Visit | Attending: Adult Health | Admitting: Adult Health

## 2024-01-15 DIAGNOSIS — R102 Pelvic and perineal pain: Secondary | ICD-10-CM | POA: Diagnosis present

## 2024-01-19 ENCOUNTER — Ambulatory Visit (HOSPITAL_COMMUNITY)
Admission: RE | Admit: 2024-01-19 | Discharge: 2024-01-19 | Disposition: A | Discharge status: Home / Self Care | Source: Ambulatory Visit | Attending: Adult Health | Admitting: Adult Health

## 2024-01-19 ENCOUNTER — Ambulatory Visit: Payer: Self-pay | Admitting: Adult Health

## 2024-01-19 DIAGNOSIS — N6311 Unspecified lump in the right breast, upper outer quadrant: Secondary | ICD-10-CM | POA: Insufficient documentation

## 2024-02-23 ENCOUNTER — Ambulatory Visit

## 2024-02-23 VITALS — BP 110/69 | HR 76

## 2024-02-23 DIAGNOSIS — N926 Irregular menstruation, unspecified: Secondary | ICD-10-CM | POA: Diagnosis not present

## 2024-02-23 DIAGNOSIS — Z3201 Encounter for pregnancy test, result positive: Secondary | ICD-10-CM | POA: Diagnosis not present

## 2024-02-23 LAB — POCT URINE PREGNANCY: Preg Test, Ur: POSITIVE — AB

## 2024-02-23 NOTE — Progress Notes (Signed)
   NURSE VISIT- PREGNANCY CONFIRMATION   SUBJECTIVE:  Kelly Watts is a 28 y.o. [id number] female at [redacted]w[redacted]d by certain LMP of Patient's last menstrual period was 01/22/2024. Here for pregnancy confirmation.  Home pregnancy test: positive x 1  She reports no complaints.  She is taking prenatal vitamins.    OBJECTIVE:  BP 110/69 (BP Location: Left Arm, Patient Position: Sitting, Cuff Size: Normal)   Pulse 76   LMP 01/22/2024   Breastfeeding No   Appears well, in no apparent distress  Results for orders placed or performed in visit on 02/23/24 (from the past 24 hours)  POCT urine pregnancy   Collection Time: 02/23/24 10:07 AM  Result Value Ref Range   Preg Test, Ur Positive (A) Negative    ASSESSMENT: Positive pregnancy test, [redacted]w[redacted]d by LMP    PLAN: Schedule for dating ultrasound in 3 weeks Prenatal vitamins: continue   Nausea medicines: not currently needed   OB packet given: No, patient has one from previous pregnancy  Kelly Watts  02/23/2024 10:08 AM

## 2024-03-08 ENCOUNTER — Encounter: Payer: Self-pay | Admitting: Family Medicine

## 2024-03-09 ENCOUNTER — Other Ambulatory Visit

## 2024-03-09 DIAGNOSIS — Z111 Encounter for screening for respiratory tuberculosis: Secondary | ICD-10-CM

## 2024-03-10 ENCOUNTER — Other Ambulatory Visit: Payer: Self-pay | Admitting: Women's Health

## 2024-03-10 MED ORDER — DOXYLAMINE-PYRIDOXINE 10-10 MG PO TBEC
DELAYED_RELEASE_TABLET | ORAL | 6 refills | Status: AC
Start: 2024-03-10 — End: ?

## 2024-03-11 LAB — QUANTIFERON-TB GOLD PLUS
Mitogen-NIL: 7.39 [IU]/mL
NIL: 0.01 [IU]/mL
QuantiFERON-TB Gold Plus: NEGATIVE
TB1-NIL: 0 [IU]/mL
TB2-NIL: 0 [IU]/mL

## 2024-03-14 ENCOUNTER — Other Ambulatory Visit: Payer: Self-pay | Admitting: Obstetrics & Gynecology

## 2024-03-14 DIAGNOSIS — O09299 Supervision of pregnancy with other poor reproductive or obstetric history, unspecified trimester: Secondary | ICD-10-CM

## 2024-03-14 DIAGNOSIS — O09899 Supervision of other high risk pregnancies, unspecified trimester: Secondary | ICD-10-CM

## 2024-03-14 DIAGNOSIS — O3680X0 Pregnancy with inconclusive fetal viability, not applicable or unspecified: Secondary | ICD-10-CM

## 2024-03-15 ENCOUNTER — Ambulatory Visit (INDEPENDENT_AMBULATORY_CARE_PROVIDER_SITE_OTHER)

## 2024-03-15 DIAGNOSIS — O3680X0 Pregnancy with inconclusive fetal viability, not applicable or unspecified: Secondary | ICD-10-CM

## 2024-03-15 DIAGNOSIS — Z3A01 Less than 8 weeks gestation of pregnancy: Secondary | ICD-10-CM

## 2024-03-15 DIAGNOSIS — O09299 Supervision of pregnancy with other poor reproductive or obstetric history, unspecified trimester: Secondary | ICD-10-CM

## 2024-03-15 DIAGNOSIS — O09899 Supervision of other high risk pregnancies, unspecified trimester: Secondary | ICD-10-CM

## 2024-03-15 NOTE — Progress Notes (Signed)
 US  7+4 wks,single IUP with yolk sac,FHR 152 bpm,CRL 10.55 mm,normal ovaries

## 2024-03-23 ENCOUNTER — Encounter: Payer: Self-pay | Admitting: Women's Health

## 2024-04-18 ENCOUNTER — Ambulatory Visit: Admitting: Women's Health

## 2024-04-18 ENCOUNTER — Encounter: Admitting: *Deleted

## 2024-04-18 ENCOUNTER — Other Ambulatory Visit (HOSPITAL_COMMUNITY)
Admission: RE | Admit: 2024-04-18 | Discharge: 2024-04-18 | Disposition: A | Discharge status: Home / Self Care | Source: Ambulatory Visit

## 2024-04-18 ENCOUNTER — Encounter: Payer: Self-pay | Admitting: Women's Health

## 2024-04-18 VITALS — BP 117/76 | HR 91 | Wt 137.0 lb

## 2024-04-18 DIAGNOSIS — Z8759 Personal history of other complications of pregnancy, childbirth and the puerperium: Secondary | ICD-10-CM | POA: Diagnosis not present

## 2024-04-18 DIAGNOSIS — Z3481 Encounter for supervision of other normal pregnancy, first trimester: Secondary | ICD-10-CM

## 2024-04-18 DIAGNOSIS — Z3A12 12 weeks gestation of pregnancy: Secondary | ICD-10-CM | POA: Diagnosis present

## 2024-04-18 DIAGNOSIS — Z1379 Encounter for other screening for genetic and chromosomal anomalies: Secondary | ICD-10-CM | POA: Diagnosis not present

## 2024-04-18 DIAGNOSIS — Z3480 Encounter for supervision of other normal pregnancy, unspecified trimester: Secondary | ICD-10-CM | POA: Insufficient documentation

## 2024-04-18 DIAGNOSIS — O219 Vomiting of pregnancy, unspecified: Secondary | ICD-10-CM | POA: Diagnosis not present

## 2024-04-18 DIAGNOSIS — Z113 Encounter for screening for infections with a predominantly sexual mode of transmission: Secondary | ICD-10-CM | POA: Insufficient documentation

## 2024-04-18 DIAGNOSIS — O09211 Supervision of pregnancy with history of pre-term labor, first trimester: Secondary | ICD-10-CM | POA: Diagnosis not present

## 2024-04-18 DIAGNOSIS — Z1332 Encounter for screening for maternal depression: Secondary | ICD-10-CM | POA: Diagnosis not present

## 2024-04-18 NOTE — Progress Notes (Signed)
 INITIAL OBSTETRICAL VISIT Patient name: Kelly Watts MRN [id number]  Date of birth: [birth year] Chief Complaint:   Initial Prenatal Visit  History of Present Illness:   Kelly Watts is a 28 y.o. [id number] Caucasian female at [redacted]w[redacted]d by LMP c/w u/s at 7 weeks with an Estimated Date of Delivery: 10/28/24 being seen today for her initial obstetrical visit.   Patient's last menstrual period was 01/22/2024. Her obstetrical history is significant for 22wk PPROM/cord prolapse/IUFD, then term uncomplicated SVB x 3, SAB x 2.   Today she reports n/v, meds help.  Last pap 01/07/24. Results were: NILM w/ HRHPV negative     04/18/2024    2:18 PM 01/07/2024    9:41 AM 12/18/2023   10:53 AM 08/03/2023   11:30 AM 03/03/2023    9:41 AM  Depression screen PHQ 2/9  Decreased Interest 0 0 0 0 0  Down, Depressed, Hopeless 0 0 0 0 0  PHQ - 2 Score 0 0 0 0 0  Altered sleeping 0 0 0 0   Tired, decreased energy 0 0 0 0   Change in appetite 0 0 0 0   Feeling bad or failure about yourself  0 0 0 0   Trouble concentrating 0 0 0 0   Moving slowly or fidgety/restless 0 0 0 0   Suicidal thoughts 0 0 0 0   PHQ-9 Score 0 0  0  0    Difficult doing work/chores   Not difficult at all Not difficult at all      Data saved with a previous flowsheet row definition        04/18/2024    2:19 PM 01/07/2024    9:42 AM 12/18/2023   10:53 AM 08/03/2023   11:31 AM  GAD 7 : Generalized Anxiety Score  Nervous, Anxious, on Edge 0 0 0 0  Control/stop worrying 0 0 0 0  Worry too much - different things 0 0 0 0  Trouble relaxing 0 0 0 0  Restless 0 0 0 0  Easily annoyed or irritable 0 0 0 0  Afraid - awful might happen 0 0 0 0  Total GAD 7 Score 0 0 0 0  Anxiety Difficulty   Not difficult at all Not difficult at all     Review of Systems:   Pertinent items are noted in HPI Denies cramping/contractions, leakage of fluid, vaginal bleeding, abnormal vaginal discharge w/ itching/odor/irritation, headaches, visual  changes, shortness of breath, chest pain, abdominal pain, severe nausea/vomiting, or problems with urination or bowel movements unless otherwise stated above.  Pertinent History Reviewed:  Reviewed past medical,surgical, social, obstetrical and family history.  Reviewed problem list, medications and allergies. OB History  Gravida Para Term Preterm AB Living  7 4 3 1 2 3   SAB IAB Ectopic Multiple Live Births  2   0 3    # Outcome Date GA Lbr Len/2nd Weight Sex Type Anes PTL Lv  7 Current           6 Term 10/26/22 [redacted]w[redacted]d / 00:23 6 lb 10 oz (3.005 kg) F Vag-Spont EPI  LIV  5 SAB 07/2021          4 Term 03/07/20 [redacted]w[redacted]d 03:50 / 00:06 5 lb 14.9 oz (2.69 kg) F Vag-Spont EPI N LIV  3 SAB 2020          2 Term 08/11/17 [redacted]w[redacted]d 15:19 / 01:00 7 lb 12.5 oz (3.53 kg) F Vag-Spont EPI N LIV  1 Preterm 07/25/12 [redacted]w[redacted]d 07:35 / 00:04 15 oz (0.425 kg) F Vag-Spont None  FD   Physical Assessment:   Vitals:   04/18/24 1403  BP: 117/76  Pulse: 91  Weight: 137 lb (62.1 kg)  Body mass index is 24.27 kg/m.       Physical Examination:  General appearance - well appearing, and in no distress  Mental status - alert, oriented to person, place, and time  Psych:  She has a normal mood and affect  Skin - warm and dry, normal color, no suspicious lesions noted  Chest - effort normal, all lung fields clear to auscultation bilaterally  Heart - normal rate and regular rhythm  Abdomen - soft, nontender  Extremities:  No swelling or varicosities noted  Pelvic - VULVA: normal appearing vulva with no masses, tenderness or lesions  VAGINA: normal appearing vagina with normal color and discharge, no lesions  CERVIX: normal appearing cervix without discharge or lesions, no CMT  Thin prep pap is not done   Chaperone: N/A  TODAY'S informal TA u/s: +FCA and active fetus  No results found for this or any previous visit (from the past 24 hours).  Assessment & Plan:  1) Low-Risk Pregnancy [id number] at [redacted]w[redacted]d with an Estimated  Date of Delivery: 10/28/24   2) Initial OB visit  3) H/O 22wk PPROM/prolapsed cord/IUFD> declines prometrium   4) N/V> meds help  Meds: No orders of the defined types were placed in this encounter.   Initial labs obtained Continue prenatal vitamins Reviewed n/v relief measures and warning s/s to report Reviewed recommended weight gain based on pre-gravid BMI Encouraged well-balanced diet Genetic & carrier screening discussed: requests Panorama and AFP, declines Horizon  Ultrasound discussed; fetal survey: requested CCNC completed> form faxed if has or is planning to apply for medicaid The nature of [facility] [employer] with multiple MDs and other Advanced Practice Providers was explained to patient; also emphasized that fellows, residents, and students are part of our team.  Follow-up: Return in about 4 weeks (around 05/16/2024) for LROB, AFP, CNM, in person; then @ 20w for anatomy u/s and LROB w/ CNM.   Orders Placed This Encounter  Procedures   Urine Culture   CBC/D/Plt+RPR+Rh+ABO+RubIgG...   Hemoglobin A1c   PANORAMA PRENATAL TEST    Suzen JONELLE Fetters CNM, [facility] 04/18/2024 2:38 PM

## 2024-04-18 NOTE — Patient Instructions (Signed)
Toni Amend, thank you for choosing our office today! We appreciate the opportunity to meet your healthcare needs. You may receive a short survey by mail, e-mail, or through [employer]. If you are happy with your care we would appreciate if you could take just a few minutes to complete the survey questions. We read all of your comments and take your feedback very seriously. Thank you again for choosing our office.  [facility] [employer] [facility] Team at [facility]  [facility] at [facility] ([address] [place], [place] [address]) Entrance C, located off of [address] Free 24/7 valet parking   Nausea & Vomiting Have saltine crackers or pretzels by your bed and eat a few bites before you raise your head out of bed in the morning Eat small frequent meals throughout the day instead of large meals Drink plenty of fluids throughout the day to stay hydrated, just don't drink a lot of fluids with your meals.  This can make your stomach fill up faster making you feel sick Do not brush your teeth right after you eat Products with real ginger are good for nausea, like ginger ale and ginger hard candy Make sure it says made with real ginger! Sucking on sour candy like lemon heads is also good for nausea If your prenatal vitamins make you nauseated, take them at night so you will sleep through the nausea Sea Bands If you feel like you need medicine for the nausea & vomiting please let us know If you are unable to keep any fluids or food down please let us know   Constipation Drink plenty of fluid, preferably water, throughout the day Eat foods high in fiber such as fruits, vegetables, and grains Exercise, such as walking, is a good way to keep your bowels regular Drink warm fluids, especially warm prune juice, or decaf coffee Eat a 1/2 cup of real oatmeal (not instant), 1/2 cup applesauce, and 1/2-1 cup warm prune juice every day If needed, you may take Colace (docusate sodium) stool  softener once or twice a day to help keep the stool soft.  If you still are having problems with constipation, you may take Miralax once daily as needed to help keep your bowels regular.   Home Blood Pressure Monitoring for Patients   Your provider has recommended that you check your blood pressure (BP) at least once a week at home. If you do not have a blood pressure cuff at home, one will be provided for you. Contact your provider if you have not received your monitor within 1 week.   Helpful Tips for Accurate Home Blood Pressure Checks  Don't smoke, exercise, or drink caffeine 30 minutes before checking your BP Use the restroom before checking your BP (a full bladder can raise your pressure) Relax in a comfortable upright chair Feet on the ground Left arm resting comfortably on a flat surface at the level of your heart Legs uncrossed Back supported Sit quietly and don't talk Place the cuff on your bare arm Adjust snuggly, so that only two fingertips can fit between your skin and the top of the cuff Check 2 readings separated by at least one minute Keep a log of your BP readings For a visual, please reference this diagram: http://ccnc.care/bpdiagram  Provider Name: [facility]     Phone: [phone]  Zone 1: ALL CLEAR  Continue to monitor your symptoms:  BP reading is less than 140 (top number) or less than 90 (bottom  number)  No right upper stomach pain No headaches or seeing spots No feeling nauseated or throwing up No swelling in face and hands  Zone 2: CAUTION Call your doctor's office for any of the following:  BP reading is greater than 140 (top number) or greater than 90 (bottom number)  Stomach pain under your ribs in the middle or right side Headaches or seeing spots Feeling nauseated or throwing up Swelling in face and hands  Zone 3: EMERGENCY  Seek immediate medical care if you have any of the following:  BP reading is greater than160 (top number) or  greater than 110 (bottom number) Severe headaches not improving with Tylenol Serious difficulty catching your breath Any worsening symptoms from Zone 2    First Trimester of Pregnancy The first trimester of pregnancy is from week 1 until the end of week 12 (months 1 through 3). A week after a sperm fertilizes an egg, the egg will implant on the wall of the uterus. This embryo will begin to develop into a baby. Genes from you and your partner are forming the baby. The female genes determine whether the baby is a boy or a girl. At 6-8 weeks, the eyes and face are formed, and the heartbeat can be seen on ultrasound. At the end of 12 weeks, all the baby's organs are formed.  Now that you are pregnant, you will want to do everything you can to have a healthy baby. Two of the most important things are to get good prenatal care and to follow your health care provider's instructions. Prenatal care is all the medical care you receive before the baby's birth. This care will help prevent, find, and treat any problems during the pregnancy and childbirth. BODY CHANGES Your body goes through many changes during pregnancy. The changes vary from woman to woman.  You may gain or lose a couple of pounds at first. You may feel sick to your stomach (nauseous) and throw up (vomit). If the vomiting is uncontrollable, call your health care provider. You may tire easily. You may develop headaches that can be relieved by medicines approved by your health care provider. You may urinate more often. Painful urination may mean you have a bladder infection. You may develop heartburn as a result of your pregnancy. You may develop constipation because certain hormones are causing the muscles that push waste through your intestines to slow down. You may develop hemorrhoids or swollen, bulging veins (varicose veins). Your breasts may begin to grow larger and become tender. Your nipples may stick out more, and the tissue that  surrounds them (areola) may become darker. Your gums may bleed and may be sensitive to brushing and flossing. Dark spots or blotches (chloasma, mask of pregnancy) may develop on your face. This will likely fade after the baby is born. Your menstrual periods will stop. You may have a loss of appetite. You may develop cravings for certain kinds of food. You may have changes in your emotions from day to day, such as being excited to be pregnant or being concerned that something may go wrong with the pregnancy and baby. You may have more vivid and strange dreams. You may have changes in your hair. These can include thickening of your hair, rapid growth, and changes in texture. Some women also have hair loss during or after pregnancy, or hair that feels dry or thin. Your hair will most likely return to normal after your baby is born. WHAT TO EXPECT AT YOUR PRENATAL  VISITS During a routine prenatal visit: You will be weighed to make sure you and the baby are growing normally. Your blood pressure will be taken. Your abdomen will be measured to track your baby's growth. The fetal heartbeat will be listened to starting around week 10 or 12 of your pregnancy. Test results from any previous visits will be discussed. Your health care provider may ask you: How you are feeling. If you are feeling the baby move. If you have had any abnormal symptoms, such as leaking fluid, bleeding, severe headaches, or abdominal cramping. If you have any questions. Other tests that may be performed during your first trimester include: Blood tests to find your blood type and to check for the presence of any previous infections. They will also be used to check for low iron levels (anemia) and Rh antibodies. Later in the pregnancy, blood tests for diabetes will be done along with other tests if problems develop. Urine tests to check for infections, diabetes, or protein in the urine. An ultrasound to confirm the proper growth  and development of the baby. An amniocentesis to check for possible genetic problems. Fetal screens for spina bifida and Down syndrome. You may need other tests to make sure you and the baby are doing well. HOME CARE INSTRUCTIONS  Medicines Follow your health care provider's instructions regarding medicine use. Specific medicines may be either safe or unsafe to take during pregnancy. Take your prenatal vitamins as directed. If you develop constipation, try taking a stool softener if your health care provider approves. Diet Eat regular, well-balanced meals. Choose a variety of foods, such as meat or vegetable-based protein, fish, milk and low-fat dairy products, vegetables, fruits, and whole grain breads and cereals. Your health care provider will help you determine the amount of weight gain that is right for you. Avoid raw meat and uncooked cheese. These carry germs that can cause birth defects in the baby. Eating four or five small meals rather than three large meals a day may help relieve nausea and vomiting. If you start to feel nauseous, eating a few soda crackers can be helpful. Drinking liquids between meals instead of during meals also seems to help nausea and vomiting. If you develop constipation, eat more high-fiber foods, such as fresh vegetables or fruit and whole grains. Drink enough fluids to keep your urine clear or pale yellow. Activity and Exercise Exercise only as directed by your health care provider. Exercising will help you: Control your weight. Stay in shape. Be prepared for labor and delivery. Experiencing pain or cramping in the lower abdomen or low back is a good sign that you should stop exercising. Check with your health care provider before continuing normal exercises. Try to avoid standing for long periods of time. Move your legs often if you must stand in one place for a long time. Avoid heavy lifting. Wear low-heeled shoes, and practice good posture. You may  continue to have sex unless your health care provider directs you otherwise. Relief of Pain or Discomfort Wear a good support bra for breast tenderness.   Take warm sitz baths to soothe any pain or discomfort caused by hemorrhoids. Use hemorrhoid cream if your health care provider approves.   Rest with your legs elevated if you have leg cramps or low back pain. If you develop varicose veins in your legs, wear support hose. Elevate your feet for 15 minutes, 3-4 times a day. Limit salt in your diet. Prenatal Care Schedule your prenatal visits by the  twelfth week of pregnancy. They are usually scheduled monthly at first, then more often in the last 2 months before delivery. Write down your questions. Take them to your prenatal visits. Keep all your prenatal visits as directed by your health care provider. Safety Wear your seat belt at all times when driving. Make a list of emergency phone numbers, including numbers for family, friends, the hospital, and police and fire departments. General Tips Ask your health care provider for a referral to a local prenatal education class. Begin classes no later than at the beginning of month 6 of your pregnancy. Ask for help if you have counseling or nutritional needs during pregnancy. Your health care provider can offer advice or refer you to specialists for help with various needs. Do not use hot tubs, steam rooms, or saunas. Do not douche or use tampons or scented sanitary pads. Do not cross your legs for long periods of time. Avoid cat litter boxes and soil used by cats. These carry germs that can cause birth defects in the baby and possibly loss of the fetus by miscarriage or stillbirth. Avoid all smoking, herbs, alcohol, and medicines not prescribed by your health care provider. Chemicals in these affect the formation and growth of the baby. Schedule a dentist appointment. At home, brush your teeth with a soft toothbrush and be gentle when you floss. SEEK  MEDICAL CARE IF:  You have dizziness. You have mild pelvic cramps, pelvic pressure, or nagging pain in the abdominal area. You have persistent nausea, vomiting, or diarrhea. You have a bad smelling vaginal discharge. You have pain with urination. You notice increased swelling in your face, hands, legs, or ankles. SEEK IMMEDIATE MEDICAL CARE IF:  You have a fever. You are leaking fluid from your vagina. You have spotting or bleeding from your vagina. You have severe abdominal cramping or pain. You have rapid weight gain or loss. You vomit blood or material that looks like coffee grounds. You are exposed to [place] measles and have never had them. You are exposed to fifth disease or chickenpox. You develop a severe headache. You have shortness of breath. You have any kind of trauma, such as from a fall or a car accident. Document Released: 04/15/2001 Document Revised: 09/05/2013 Document Reviewed: 03/01/2013 [facility] Patient Information 2015 [place], [place]. This information is not intended to replace advice given to you by your health care provider. Make sure you discuss any questions you have with your health care provider.

## 2024-04-19 LAB — CBC/D/PLT+RPR+RH+ABO+RUBIGG...
Antibody Screen: NEGATIVE
Basophils Absolute: 0 x10E3/uL (ref 0.0–0.2)
Basos: 0 %
EOS (ABSOLUTE): 0 x10E3/uL (ref 0.0–0.4)
Eos: 0 %
HCV Ab: NONREACTIVE
HIV Screen 4th Generation wRfx: NONREACTIVE
Hematocrit: 41.4 % (ref 34.0–46.6)
Hemoglobin: 14.2 g/dL (ref 11.1–15.9)
Hepatitis B Surface Ag: NEGATIVE
Immature Grans (Abs): 0 x10E3/uL (ref 0.0–0.1)
Immature Granulocytes: 0 %
Lymphocytes Absolute: 2.4 x10E3/uL (ref 0.7–3.1)
Lymphs: 26 %
MCH: 31.2 pg (ref 26.6–33.0)
MCHC: 34.3 g/dL (ref 31.5–35.7)
MCV: 91 fL (ref 79–97)
Monocytes Absolute: 0.6 x10E3/uL (ref 0.1–0.9)
Monocytes: 7 %
Neutrophils Absolute: 6.2 x10E3/uL (ref 1.4–7.0)
Neutrophils: 67 %
Platelets: 277 x10E3/uL (ref 150–450)
RBC: 4.55 x10E6/uL (ref 3.77–5.28)
RDW: 12 % (ref 11.7–15.4)
RPR Ser Ql: NONREACTIVE
Rh Factor: NEGATIVE
Rubella Antibodies, IGG: 1.83 {index} (ref >0.99)
WBC: 9.3 x10E3/uL (ref 3.4–10.8)

## 2024-04-19 LAB — HEMOGLOBIN A1C
Est. average glucose Bld gHb Est-mCnc: 97 mg/dL
Hgb A1c MFr Bld: 5 % (ref 4.8–5.6)

## 2024-04-19 LAB — HCV INTERPRETATION

## 2024-04-20 LAB — CERVICOVAGINAL ANCILLARY ONLY
Chlamydia: NEGATIVE
Comment: NEGATIVE
Comment: NORMAL
Neisseria Gonorrhea: NEGATIVE

## 2024-04-20 LAB — URINE CULTURE: Organism ID, Bacteria: NO GROWTH

## 2024-04-24 LAB — PANORAMA PRENATAL TEST FULL PANEL:PANORAMA TEST PLUS 5 ADDITIONAL MICRODELETIONS: FETAL FRACTION: 12.2

## 2024-04-25 ENCOUNTER — Ambulatory Visit: Payer: Self-pay | Admitting: Women's Health

## 2024-04-25 DIAGNOSIS — Z3480 Encounter for supervision of other normal pregnancy, unspecified trimester: Secondary | ICD-10-CM

## 2024-05-16 ENCOUNTER — Ambulatory Visit: Admitting: Women's Health

## 2024-05-16 ENCOUNTER — Encounter: Payer: Self-pay | Admitting: Women's Health

## 2024-05-16 VITALS — BP 99/66 | HR 88 | Wt 138.0 lb

## 2024-05-16 DIAGNOSIS — O09212 Supervision of pregnancy with history of pre-term labor, second trimester: Secondary | ICD-10-CM | POA: Diagnosis not present

## 2024-05-16 DIAGNOSIS — Z363 Encounter for antenatal screening for malformations: Secondary | ICD-10-CM

## 2024-05-16 DIAGNOSIS — Z8759 Personal history of other complications of pregnancy, childbirth and the puerperium: Secondary | ICD-10-CM

## 2024-05-16 DIAGNOSIS — Z3A16 16 weeks gestation of pregnancy: Secondary | ICD-10-CM

## 2024-05-16 DIAGNOSIS — Z3482 Encounter for supervision of other normal pregnancy, second trimester: Secondary | ICD-10-CM

## 2024-05-16 DIAGNOSIS — Z3480 Encounter for supervision of other normal pregnancy, unspecified trimester: Secondary | ICD-10-CM

## 2024-05-16 NOTE — Progress Notes (Signed)
 "   LOW-RISK PREGNANCY VISIT Patient name: Kelly Watts MRN [id number]  Date of birth: 1995-11-11 Chief Complaint:   Routine Prenatal Visit  History of Present Illness:   Kelly Watts is a 29 y.o. H2E6876 female at [redacted]w[redacted]d with an Estimated Date of Delivery: 10/28/24 being seen today for ongoing management of a low-risk pregnancy.   Today she reports no complaints. Contractions: Not present. Vag. Bleeding: None.  Movement: Present. denies leaking of fluid.     04/18/2024    2:18 PM 01/07/2024    9:41 AM 12/18/2023   10:53 AM 08/03/2023   11:30 AM 03/03/2023    9:41 AM  Depression screen PHQ 2/9  Decreased Interest 0 0 0 0 0  Down, Depressed, Hopeless 0 0 0 0 0  PHQ - 2 Score 0 0 0 0 0  Altered sleeping 0 0 0 0   Tired, decreased energy 0 0 0 0   Change in appetite 0 0 0 0   Feeling bad or failure about yourself  0 0 0 0   Trouble concentrating 0 0 0 0   Moving slowly or fidgety/restless 0 0 0 0   Suicidal thoughts 0 0 0 0   PHQ-9 Score 0 0  0  0    Difficult doing work/chores   Not difficult at all Not difficult at all      Data saved with a previous flowsheet row definition        04/18/2024    2:19 PM 01/07/2024    9:42 AM 12/18/2023   10:53 AM 08/03/2023   11:31 AM  GAD 7 : Generalized Anxiety Score  Nervous, Anxious, on Edge 0 0 0 0  Control/stop worrying 0 0 0 0  Worry too much - different things 0 0 0 0  Trouble relaxing 0 0 0 0  Restless 0 0 0 0  Easily annoyed or irritable 0 0 0 0  Afraid - awful might happen 0 0 0 0  Total GAD 7 Score 0 0 0 0  Anxiety Difficulty   Not difficult at all Not difficult at all      Review of Systems:   Pertinent items are noted in HPI Denies abnormal vaginal discharge w/ itching/odor/irritation, headaches, visual changes, shortness of breath, chest pain, abdominal pain, severe nausea/vomiting, or problems with urination or bowel movements unless otherwise stated above. Pertinent History Reviewed:  Reviewed past  medical,surgical, social, obstetrical and family history.  Reviewed problem list, medications and allergies. Physical Assessment:   Vitals:   05/16/24 1438  BP: 99/66  Pulse: 88  Weight: 138 lb (62.6 kg)  Body mass index is 24.45 kg/m.        Physical Examination:   General appearance: Well appearing, and in no distress  Mental status: Alert, oriented to person, place, and time  Skin: Warm & dry  Cardiovascular: Normal heart rate noted  Respiratory: Normal respiratory effort, no distress  Abdomen: Soft, gravid, nontender  Pelvic: Cervical exam deferred         Extremities: Edema: None  Fetal Status: Fetal Heart Rate (bpm): 153   Movement: Present    Chaperone: N/A No results found for this or any previous visit (from the past 24 hours).  Assessment & Plan:  1) Low-risk pregnancy [id number] at [redacted]w[redacted]d with an Estimated Date of Delivery: 10/28/24   2) H/O 22w PPROM/IUFD, prolapsed cord   Meds: No orders of the defined types were placed in this encounter.  Labs/procedures today: AFP  Plan:  Continue routine obstetrical care  Next visit: prefers will be in person for u/s    Reviewed: Preterm labor symptoms and general obstetric precautions including but not limited to vaginal bleeding, contractions, leaking of fluid and fetal movement were reviewed in detail with the patient.  All questions were answered. Does have home bp cuff. Office bp cuff given: not applicable. Check bp weekly, let us  know if consistently >140 and/or >90.  Follow-up: Return for As scheduled.  Future Appointments  Date Time Provider Department Center  06/13/2024  9:15 AM [facility] US  [facility] None  06/13/2024 10:10 AM Kizzie Suzen SAUNDERS, CNM [facility]    Orders Placed This Encounter  Procedures   US  OB Comp + 14 Wk   AFP, Serum, Open Spina Bifida   Suzen SAUNDERS Kizzie CNM, [facility] 05/16/2024 2:59 PM  "

## 2024-05-16 NOTE — Patient Instructions (Signed)
Kelly Watts, thank you for choosing our office today! We appreciate the opportunity to meet your healthcare needs. You may receive a short survey by mail, e-mail, or through MyChart. If you are happy with your care we would appreciate if you could take just a few minutes to complete the survey questions. We read all of your comments and take your feedback very seriously. Thank you again for choosing our office.  [facility] Team at [facility] ([address] [place], [place] [address]) Entrance C, located off of [address] Free 24/7 valet parking  Go to [email or web address] to register for FREE online childbirth classes  Call the office ([phone]) or go to [facility] if: You begin to severe cramping Your water breaks.  Sometimes it is a big gush of fluid, sometimes it is just a trickle that keeps getting your panties wet or running down your legs You have vaginal bleeding.  It is normal to have a small amount of spotting if your cervix was checked.   [place] [facility]/Family Doctors [facility] ([facility]): [address], [phone]           [facility]: [address], [phone]                [facility] ([facility]): [address], [phone] (call to ask if accepting patients) [facility]: [address], [place], [phone]    [place] [facility]/Family Doctors [facility] ([facility]): [address], [phone] [facility]: [address], [phone] [facility]: [address], [phone]  [place] [facility]  [facility] ([facility]): [phone] [facility]: [address], [phone]   [place] [facility]: [address], [phone]  [place] [facility]  [facility]: [address], [phone]  Home Blood Pressure Monitoring for Patients   Your provider has recommended that you check your blood pressure (BP) at least once a week at home. If you do not have a blood pressure cuff at home, one will be provided for you. Contact your provider if you have not received your monitor within 1 week.   Helpful Tips for Accurate Home Blood Pressure Checks  Don't smoke, exercise, or drink caffeine 30 minutes before checking your BP Use the restroom before checking your BP (a full bladder can raise your pressure) Relax in a comfortable upright chair Feet on the ground Left arm resting comfortably on a flat surface at the level of your heart Legs uncrossed Back supported Sit quietly and don't talk Place the cuff on your bare arm Adjust snuggly, so that only two fingertips can fit between your skin and the top of the cuff Check 2 readings separated by at least one minute Keep a log of your BP readings For a visual, please reference this diagram: http://[facility]nc.care/bpdiagram  Provider Name: [facility]     Phone: [phone]  Zone 1: ALL CLEAR  Continue to monitor your symptoms:  BP reading is less than 140 (top number) or less than 90 (bottom number)  No right upper stomach pain No headaches or seeing spots No feeling nauseated or throwing up No swelling in face and hands  Zone 2: CAUTION Call your doctor's office for any of the following:  BP reading is greater than 140 (top number) or greater than   90 (bottom number)  Stomach pain under your ribs in the middle or right side Headaches or seeing spots Feeling nauseated or throwing up Swelling in face and hands  Zone 3: EMERGENCY  Seek immediate medical care if you have any of the following:  BP reading is greater than160 (top number) or greater than 110 (bottom number) Severe headaches not improving with Tylenol Serious difficulty catching your breath Any worsening symptoms from  Zone 2     Second Trimester of Pregnancy The second trimester is from week 14 through week 27 (months 4 through 6). The second trimester is often a time when you feel your best. Your body has adjusted to being pregnant, and you begin to feel better physically. Usually, morning sickness has lessened or quit completely, you may have more energy, and you may have an increase in appetite. The second trimester is also a time when the fetus is growing rapidly. At the end of the sixth month, the fetus is about 9 inches long and weighs about 1 pounds. You will likely begin to feel the baby move (quickening) between 16 and 20 weeks of pregnancy. Body changes during your second trimester Your body continues to go through many changes during your second trimester. The changes vary from woman to woman. Your weight will continue to increase. You will notice your lower abdomen bulging out. You may begin to get stretch marks on your hips, abdomen, and breasts. You may develop headaches that can be relieved by medicines. The medicines should be approved by your health care provider. You may urinate more often because the fetus is pressing on your bladder. You may develop or continue to have heartburn as a result of your pregnancy. You may develop constipation because certain hormones are causing the muscles that push waste through your intestines to slow down. You may develop hemorrhoids or swollen, bulging veins (varicose veins). You may have back pain. This is caused by: Weight gain. Pregnancy hormones that are relaxing the joints in your pelvis. A shift in weight and the muscles that support your balance. Your breasts will continue to grow and they will continue to become tender. Your gums may bleed and may be sensitive to brushing and flossing. Dark spots or blotches (chloasma, mask of pregnancy) may develop on your face. This will likely fade after the baby is born. A dark line from your belly button to  the pubic area (linea nigra) may appear. This will likely fade after the baby is born. You may have changes in your hair. These can include thickening of your hair, rapid growth, and changes in texture. Some women also have hair loss during or after pregnancy, or hair that feels dry or thin. Your hair will most likely return to normal after your baby is born.  What to expect at prenatal visits During a routine prenatal visit: You will be weighed to make sure you and the fetus are growing normally. Your blood pressure will be taken. Your abdomen will be measured to track your baby's growth. The fetal heartbeat will be listened to. Any test results from the previous visit will be discussed.  Your health care provider may ask you: How you are feeling. If you are feeling the baby move. If you have had any abnormal symptoms, such as leaking fluid, bleeding, severe headaches, or abdominal cramping. If you are using any tobacco products, including cigarettes, chewing tobacco, and electronic cigarettes. If you have any questions.  Other tests that may be performed during   your second trimester include: Blood tests that check for: Low iron levels (anemia). High blood sugar that affects pregnant women (gestational diabetes) between 24 and 28 weeks. Rh antibodies. This is to check for a protein on red blood cells (Rh factor). Urine tests to check for infections, diabetes, or protein in the urine. An ultrasound to confirm the proper growth and development of the baby. An amniocentesis to check for possible genetic problems. Fetal screens for spina bifida and Down syndrome. HIV (human immunodeficiency virus) testing. Routine prenatal testing includes screening for HIV, unless you choose not to have this test.  Follow these instructions at home: Medicines Follow your health care provider's instructions regarding medicine use. Specific medicines may be either safe or unsafe to take during  pregnancy. Take a prenatal vitamin that contains at least 600 micrograms (mcg) of folic acid. If you develop constipation, try taking a stool softener if your health care provider approves. Eating and drinking Eat a balanced diet that includes fresh fruits and vegetables, whole grains, good sources of protein such as meat, eggs, or tofu, and low-fat dairy. Your health care provider will help you determine the amount of weight gain that is right for you. Avoid raw meat and uncooked cheese. These carry germs that can cause birth defects in the baby. If you have low calcium intake from food, talk to your health care provider about whether you should take a daily calcium supplement. Limit foods that are high in fat and processed sugars, such as fried and sweet foods. To prevent constipation: Drink enough fluid to keep your urine clear or pale yellow. Eat foods that are high in fiber, such as fresh fruits and vegetables, whole grains, and beans. Activity Exercise only as directed by your health care provider. Most women can continue their usual exercise routine during pregnancy. Try to exercise for 30 minutes at least 5 days a week. Stop exercising if you experience uterine contractions. Avoid heavy lifting, wear low heel shoes, and practice good posture. A sexual relationship may be continued unless your health care provider directs you otherwise. Relieving pain and discomfort Wear a good support bra to prevent discomfort from breast tenderness. Take warm sitz baths to soothe any pain or discomfort caused by hemorrhoids. Use hemorrhoid cream if your health care provider approves. Rest with your legs elevated if you have leg cramps or low back pain. If you develop varicose veins, wear support hose. Elevate your feet for 15 minutes, 3-4 times a day. Limit salt in your diet. Prenatal Care Write down your questions. Take them to your prenatal visits. Keep all your prenatal visits as told by your health  care provider. This is important. Safety Wear your seat belt at all times when driving. Make a list of emergency phone numbers, including numbers for family, friends, the hospital, and police and fire departments. General instructions Ask your health care provider for a referral to a local prenatal education class. Begin classes no later than the beginning of month 6 of your pregnancy. Ask for help if you have counseling or nutritional needs during pregnancy. Your health care provider can offer advice or refer you to specialists for help with various needs. Do not use hot tubs, steam rooms, or saunas. Do not douche or use tampons or scented sanitary pads. Do not cross your legs for long periods of time. Avoid cat litter boxes and soil used by cats. These carry germs that can cause birth defects in the baby and possibly loss of the   fetus by miscarriage or stillbirth. Avoid all smoking, herbs, alcohol, and unprescribed drugs. Chemicals in these products can affect the formation and growth of the baby. Do not use any products that contain nicotine or tobacco, such as cigarettes and e-cigarettes. If you need help quitting, ask your health care provider. Visit your dentist if you have not gone yet during your pregnancy. Use a soft toothbrush to brush your teeth and be gentle when you floss. Contact a health care provider if: You have dizziness. You have mild pelvic cramps, pelvic pressure, or nagging pain in the abdominal area. You have persistent nausea, vomiting, or diarrhea. You have a bad smelling vaginal discharge. You have pain when you urinate. Get help right away if: You have a fever. You are leaking fluid from your vagina. You have spotting or bleeding from your vagina. You have severe abdominal cramping or pain. You have rapid weight gain or weight loss. You have shortness of breath with chest pain. You notice sudden or extreme swelling of your face, hands, ankles, feet, or legs. You  have not felt your baby move in over an hour. You have severe headaches that do not go away when you take medicine. You have vision changes. Summary The second trimester is from week 14 through week 27 (months 4 through 6). It is also a time when the fetus is growing rapidly. Your body goes through many changes during pregnancy. The changes vary from woman to woman. Avoid all smoking, herbs, alcohol, and unprescribed drugs. These chemicals affect the formation and growth your baby. Do not use any tobacco products, such as cigarettes, chewing tobacco, and e-cigarettes. If you need help quitting, ask your health care provider. Contact your health care provider if you have any questions. Keep all prenatal visits as told by your health care provider. This is important. This information is not intended to replace advice given to you by your health care provider. Make sure you discuss any questions you have with your health care provider. Document Released: 04/15/2001 Document Revised: 09/27/2015 Document Reviewed: 06/22/2012 [employer] Interactive Patient Education  2017 [employer]  

## 2024-05-18 ENCOUNTER — Ambulatory Visit: Payer: Self-pay | Admitting: Women's Health

## 2024-05-18 DIAGNOSIS — Z3480 Encounter for supervision of other normal pregnancy, unspecified trimester: Secondary | ICD-10-CM

## 2024-05-18 LAB — AFP, SERUM, OPEN SPINA BIFIDA
AFP MoM: 1.14
AFP Value: 41 ng/mL
Gest. Age on Collection Date: 16.3 wk
Maternal Age At EDD: 29.2 a
OSBR Risk 1 IN: 7850
Test Results:: NEGATIVE
Weight: 138 [lb_av]

## 2024-06-13 ENCOUNTER — Other Ambulatory Visit

## 2024-06-13 ENCOUNTER — Encounter: Admitting: Women's Health
